# Patient Record
Sex: Female | Born: 1965 | Race: White | Hispanic: No | Smoking: Current every day smoker
Health system: Southern US, Community
[De-identification: ages and names within clinical notes are randomized; demographics above are authoritative.]

## PROBLEM LIST (undated history)

## (undated) DIAGNOSIS — E7800 Pure hypercholesterolemia, unspecified: Secondary | ICD-10-CM

## (undated) DIAGNOSIS — M1990 Unspecified osteoarthritis, unspecified site: Secondary | ICD-10-CM

## (undated) DIAGNOSIS — T7840XA Allergy, unspecified, initial encounter: Secondary | ICD-10-CM

## (undated) DIAGNOSIS — Z87898 Personal history of other specified conditions: Secondary | ICD-10-CM

## (undated) DIAGNOSIS — B009 Herpesviral infection, unspecified: Secondary | ICD-10-CM

## (undated) HISTORY — DX: Herpesviral infection, unspecified: B00.9

## (undated) HISTORY — DX: Pure hypercholesterolemia, unspecified: E78.00

## (undated) HISTORY — DX: Allergy, unspecified, initial encounter: T78.40XA

## (undated) HISTORY — PX: TYMPANOPLASTY: SHX33

## (undated) HISTORY — DX: Personal history of other specified conditions: Z87.898

## (undated) HISTORY — PX: NOVASURE ABLATION: SHX5394

## (undated) HISTORY — PX: CERVICAL BIOPSY  W/ LOOP ELECTRODE EXCISION: SUR135

## (undated) HISTORY — PX: JOINT REPLACEMENT: SHX530

---

## 2005-11-07 ENCOUNTER — Ambulatory Visit: Payer: Self-pay | Admitting: Obstetrics and Gynecology

## 2006-11-11 ENCOUNTER — Ambulatory Visit: Payer: Self-pay | Admitting: Obstetrics and Gynecology

## 2007-02-26 ENCOUNTER — Ambulatory Visit: Payer: Self-pay | Admitting: Obstetrics and Gynecology

## 2007-03-13 ENCOUNTER — Ambulatory Visit: Payer: Self-pay | Admitting: Obstetrics and Gynecology

## 2007-09-03 HISTORY — PX: ANTERIOR CRUCIATE LIGAMENT REPAIR: SHX115

## 2007-12-15 ENCOUNTER — Ambulatory Visit: Payer: Self-pay | Admitting: Internal Medicine

## 2007-12-21 ENCOUNTER — Ambulatory Visit: Payer: Self-pay | Admitting: Obstetrics and Gynecology

## 2008-09-02 ENCOUNTER — Emergency Department: Payer: Self-pay | Admitting: Emergency Medicine

## 2008-09-07 ENCOUNTER — Ambulatory Visit: Payer: Self-pay | Admitting: Internal Medicine

## 2008-11-03 ENCOUNTER — Encounter: Payer: Self-pay | Admitting: Orthopedic Surgery

## 2008-12-01 ENCOUNTER — Encounter: Payer: Self-pay | Admitting: Orthopedic Surgery

## 2009-01-26 ENCOUNTER — Ambulatory Visit: Payer: Self-pay | Admitting: Obstetrics and Gynecology

## 2010-02-27 ENCOUNTER — Ambulatory Visit: Payer: Self-pay | Admitting: Obstetrics and Gynecology

## 2010-11-28 ENCOUNTER — Encounter: Payer: Self-pay | Admitting: Orthopedic Surgery

## 2010-12-02 ENCOUNTER — Encounter: Payer: Self-pay | Admitting: Orthopedic Surgery

## 2011-06-13 ENCOUNTER — Ambulatory Visit: Payer: Self-pay | Admitting: Obstetrics and Gynecology

## 2012-09-01 ENCOUNTER — Ambulatory Visit: Payer: Self-pay | Admitting: Obstetrics and Gynecology

## 2013-07-12 ENCOUNTER — Encounter: Payer: Self-pay | Admitting: Internal Medicine

## 2013-07-12 ENCOUNTER — Encounter (INDEPENDENT_AMBULATORY_CARE_PROVIDER_SITE_OTHER): Payer: Self-pay

## 2013-07-12 ENCOUNTER — Ambulatory Visit (INDEPENDENT_AMBULATORY_CARE_PROVIDER_SITE_OTHER): Payer: BC Managed Care – PPO | Admitting: Internal Medicine

## 2013-07-12 VITALS — BP 120/80 | HR 70 | Temp 98.2°F | Ht 67.0 in | Wt 191.0 lb

## 2013-07-12 DIAGNOSIS — Z8742 Personal history of other diseases of the female genital tract: Secondary | ICD-10-CM

## 2013-07-12 DIAGNOSIS — B009 Herpesviral infection, unspecified: Secondary | ICD-10-CM

## 2013-07-12 DIAGNOSIS — F4311 Post-traumatic stress disorder, acute: Secondary | ICD-10-CM

## 2013-07-12 DIAGNOSIS — F4310 Post-traumatic stress disorder, unspecified: Secondary | ICD-10-CM

## 2013-07-12 DIAGNOSIS — Z87898 Personal history of other specified conditions: Secondary | ICD-10-CM

## 2013-07-12 DIAGNOSIS — M25569 Pain in unspecified knee: Secondary | ICD-10-CM

## 2013-07-12 DIAGNOSIS — E7800 Pure hypercholesterolemia, unspecified: Secondary | ICD-10-CM

## 2013-07-12 NOTE — Progress Notes (Signed)
Pre-visit discussion using our clinic review tool. No additional management support is needed unless otherwise documented below in the visit note.  

## 2013-07-15 ENCOUNTER — Encounter: Payer: Self-pay | Admitting: Internal Medicine

## 2013-07-15 DIAGNOSIS — F439 Reaction to severe stress, unspecified: Secondary | ICD-10-CM | POA: Insufficient documentation

## 2013-07-15 DIAGNOSIS — B009 Herpesviral infection, unspecified: Secondary | ICD-10-CM | POA: Insufficient documentation

## 2013-07-15 DIAGNOSIS — E7800 Pure hypercholesterolemia, unspecified: Secondary | ICD-10-CM | POA: Insufficient documentation

## 2013-07-15 DIAGNOSIS — R87619 Unspecified abnormal cytological findings in specimens from cervix uteri: Secondary | ICD-10-CM | POA: Insufficient documentation

## 2013-07-15 DIAGNOSIS — M25569 Pain in unspecified knee: Secondary | ICD-10-CM | POA: Insufficient documentation

## 2013-07-15 NOTE — Assessment & Plan Note (Signed)
On Valtrex 

## 2013-07-15 NOTE — Assessment & Plan Note (Signed)
Doing well.  On citalopram.

## 2013-07-15 NOTE — Assessment & Plan Note (Signed)
On simvastatin.  Low cholesterol diet.  Follow lipid profile and liver panel.   

## 2013-07-15 NOTE — Assessment & Plan Note (Signed)
S/p previous LEEP.  Recent paps negative.  Has been followed by Dr Feliberto Gottron.

## 2013-07-15 NOTE — Assessment & Plan Note (Signed)
S/p previous knee surgery.  Doing well.

## 2013-07-15 NOTE — Progress Notes (Signed)
  Subjective:    Patient ID: Maria Munoz, female    DOB: 1965/12/31, 47 y.o.   MRN: [id number]  HPI 47 year old female with past history of hypercholesterolemia and abnormal pap smear s/p LEEP.  She comes in today to establish care.  She has been seeing Dr Feliberto Gottron.  She had a knee surgery (left knee).  Doing well.  Wears a kne brace.  Still exercising.  She also had Novasure ablation.  No bleeding since.  Had last pap smear 09/2012.  Sees Dr Feliberto Gottron.  Latest pap smears normal.  Stays active.  No cardiac symptoms with increased activity or exertion.  Breathing stable.  No nausea or vomiting.  Bowels stable.  On citalopram.  Doing well on this.  Had some counseling previously when she went through her divorce.      Past Medical History  Diagnosis Date  . Herpes     H/O  . Hypercholesterolemia   . History of abnormal Pap smear     Outpatient Encounter Prescriptions as of 07/12/2013  Medication Sig  . citalopram (CELEXA) 20 MG tablet Take 20 mg by mouth daily.  . meloxicam (MOBIC) 15 MG tablet Take 15 mg by mouth daily.  . simvastatin (ZOCOR) 20 MG tablet Take 20 mg by mouth daily.  . valACYclovir (VALTREX) 500 MG tablet Take 500 mg by mouth daily.  . [DISCONTINUED] escitalopram (LEXAPRO) 10 MG tablet Take 10 mg by mouth daily.  . [DISCONTINUED] meloxicam (MOBIC) 7.5 MG tablet Take 7.5 mg by mouth daily.    Review of Systems Patient denies any headache, lightheadedness or dizziness.  No sinus or allergy symptoms. iNo chest pain, tightness or palpatations.  No increased shortness of breath, cough or congestion.  No nausea or vomiting.  No acid reflux.  No abdominal pain or cramping.  No bowel change, such as diarrhea, constipation, BRBPR or melana.  No urine change.  No vaginal problems.  No bleeding since her novasure ablation.  Overall feels she is doing well.         Objective:   Physical Exam Filed Vitals:   07/12/13 1028  BP: 120/80  Pulse: 70  Temp: 98.2 F (31.21 C)   46  year old female in no acute distress.   HEENT:  Nares- clear.  Oropharynx - without lesions. NECK:  Supple.  Nontender.  No audible bruit.  HEART:  Appears to be regular. LUNGS:  No crackles or wheezing audible.  Respirations even and unlabored.  RADIAL PULSE:  Equal bilaterally.    BREASTS:  Performed by Dr Feliberto Gottron.   ABDOMEN:  Soft, nontender.  Bowel sounds present and normal.  No audible abdominal bruit.  GU:  Performed by Dr Feliberto Gottron.   EXTREMITIES:  No increased edema present.  DP pulses palpable and equal bilaterally.      Assessment & Plan:  HEALTH MAINTENANCE.  Has seen Dr Feliberto Gottron.  Had her breast, pelvic and pap smear in 09/2012.  Obtain records.  Mammogram 04/2013.    I spent 40 minutes with the patient and more than 50% of the time was spent in consultation regarding the above.

## 2013-07-16 ENCOUNTER — Other Ambulatory Visit: Payer: Self-pay | Admitting: *Deleted

## 2013-07-16 MED ORDER — VALACYCLOVIR HCL 500 MG PO TABS: 500.0000 mg | ORAL_TABLET | Freq: Every day | ORAL | Status: DC

## 2013-07-16 MED ORDER — CITALOPRAM HYDROBROMIDE 20 MG PO TABS: 20.0000 mg | ORAL_TABLET | Freq: Every day | ORAL | Status: DC

## 2013-07-16 MED ORDER — MELOXICAM 15 MG PO TABS: 15.0000 mg | ORAL_TABLET | Freq: Every day | ORAL | Status: DC

## 2013-07-23 ENCOUNTER — Other Ambulatory Visit (INDEPENDENT_AMBULATORY_CARE_PROVIDER_SITE_OTHER): Payer: BC Managed Care – PPO

## 2013-07-23 DIAGNOSIS — E7800 Pure hypercholesterolemia, unspecified: Secondary | ICD-10-CM

## 2013-07-23 LAB — LIPID PANEL
Cholesterol: 205 mg/dL — ABNORMAL HIGH (ref 0–200)
HDL: 83.7 mg/dL (ref >39.00)
Total CHOL/HDL Ratio: 2
Triglycerides: 80 mg/dL (ref 0.0–149.0)
VLDL: 16 mg/dL (ref 0.0–40.0)

## 2013-07-23 LAB — TSH: TSH: 1.25 u[IU]/mL (ref 0.35–5.50)

## 2013-07-23 LAB — CBC WITH DIFFERENTIAL/PLATELET
Basophils Absolute: 0.1 10*3/uL (ref 0.0–0.1)
Basophils Relative: 1.1 % (ref 0.0–3.0)
Eosinophils Absolute: 0.3 10*3/uL (ref 0.0–0.7)
Eosinophils Relative: 3.5 % (ref 0.0–5.0)
HCT: 39.3 % (ref 36.0–46.0)
Hemoglobin: 13.4 g/dL (ref 12.0–15.0)
Lymphs Abs: 2.3 10*3/uL (ref 0.7–4.0)
MCHC: 34.1 g/dL (ref 30.0–36.0)
MCV: 92.4 fl (ref 78.0–100.0)
Monocytes Absolute: 0.5 10*3/uL (ref 0.1–1.0)
Neutro Abs: 4.6 10*3/uL (ref 1.4–7.7)
Neutrophils Relative %: 59.2 % (ref 43.0–77.0)
RBC: 4.25 Mil/uL (ref 3.87–5.11)

## 2013-07-23 LAB — COMPREHENSIVE METABOLIC PANEL
ALT: 23 U/L (ref 0–35)
AST: 20 U/L (ref 0–37)
Albumin: 3.8 g/dL (ref 3.5–5.2)
Alkaline Phosphatase: 65 U/L (ref 39–117)
BUN: 13 mg/dL (ref 6–23)
Creatinine, Ser: 0.6 mg/dL (ref 0.4–1.2)
Glucose, Bld: 79 mg/dL (ref 70–99)
Potassium: 4.1 mEq/L (ref 3.5–5.1)
Total Bilirubin: 0.6 mg/dL (ref 0.3–1.2)

## 2013-07-26 ENCOUNTER — Encounter: Payer: Self-pay | Admitting: *Deleted

## 2013-10-19 ENCOUNTER — Encounter: Payer: BC Managed Care – PPO | Admitting: Internal Medicine

## 2013-12-15 ENCOUNTER — Ambulatory Visit (INDEPENDENT_AMBULATORY_CARE_PROVIDER_SITE_OTHER)
Admission: RE | Admit: 2013-12-15 | Discharge: 2013-12-15 | Disposition: A | Discharge status: Home / Self Care | Payer: BC Managed Care – PPO | Source: Ambulatory Visit | Attending: Internal Medicine | Admitting: Internal Medicine

## 2013-12-15 ENCOUNTER — Encounter: Payer: Self-pay | Admitting: Emergency Medicine

## 2013-12-15 ENCOUNTER — Ambulatory Visit (INDEPENDENT_AMBULATORY_CARE_PROVIDER_SITE_OTHER): Payer: BC Managed Care – PPO | Admitting: Internal Medicine

## 2013-12-15 ENCOUNTER — Encounter: Payer: Self-pay | Admitting: Internal Medicine

## 2013-12-15 ENCOUNTER — Other Ambulatory Visit (HOSPITAL_COMMUNITY)
Admission: RE | Admit: 2013-12-15 | Discharge: 2013-12-15 | Disposition: A | Discharge status: Home / Self Care | Payer: BC Managed Care – PPO | Source: Ambulatory Visit | Attending: Internal Medicine | Admitting: Internal Medicine

## 2013-12-15 VITALS — BP 114/78 | HR 72 | Temp 97.9°F | Ht 65.5 in | Wt 198.2 lb

## 2013-12-15 DIAGNOSIS — Z01419 Encounter for gynecological examination (general) (routine) without abnormal findings: Secondary | ICD-10-CM | POA: Insufficient documentation

## 2013-12-15 DIAGNOSIS — M25561 Pain in right knee: Secondary | ICD-10-CM

## 2013-12-15 DIAGNOSIS — M25569 Pain in unspecified knee: Secondary | ICD-10-CM

## 2013-12-15 DIAGNOSIS — Z733 Stress, not elsewhere classified: Secondary | ICD-10-CM

## 2013-12-15 DIAGNOSIS — R634 Abnormal weight loss: Secondary | ICD-10-CM

## 2013-12-15 DIAGNOSIS — F439 Reaction to severe stress, unspecified: Secondary | ICD-10-CM

## 2013-12-15 DIAGNOSIS — Z1239 Encounter for other screening for malignant neoplasm of breast: Secondary | ICD-10-CM

## 2013-12-15 DIAGNOSIS — M542 Cervicalgia: Secondary | ICD-10-CM

## 2013-12-15 DIAGNOSIS — Z124 Encounter for screening for malignant neoplasm of cervix: Secondary | ICD-10-CM

## 2013-12-15 DIAGNOSIS — Z1151 Encounter for screening for human papillomavirus (HPV): Secondary | ICD-10-CM | POA: Insufficient documentation

## 2013-12-15 DIAGNOSIS — G5600 Carpal tunnel syndrome, unspecified upper limb: Secondary | ICD-10-CM

## 2013-12-15 DIAGNOSIS — E7800 Pure hypercholesterolemia, unspecified: Secondary | ICD-10-CM

## 2013-12-15 DIAGNOSIS — B009 Herpesviral infection, unspecified: Secondary | ICD-10-CM

## 2013-12-15 MED ORDER — CITALOPRAM HYDROBROMIDE 20 MG PO TABS: 20.0000 mg | ORAL_TABLET | Freq: Every day | ORAL | Status: DC

## 2013-12-15 NOTE — Progress Notes (Signed)
Pre visit review using our clinic review tool, if applicable. No additional management support is needed unless otherwise documented below in the visit note. 

## 2013-12-15 NOTE — Progress Notes (Signed)
Subjective:    Patient ID: Maria Munoz, female    DOB: 14-Aug-1966, 48 y.o.   MRN: [id number]  HPI 48 year old female with past history of hypercholesterolemia and abnormal pap smear s/p LEEP.  She comes in today to f/u on these issues as well as for a complete physical exam.   She has been seeing Dr Ouida Sills.  She had a knee surgery (left knee).  Still exercising.  Walks 3x/week (20-59min).  Her right knee is limiting her walking.  States has been told had some deterioration in her knee.  She also had Novasure ablation.  No bleeding since.  Had last pap smear 09/2012.  Sees Dr Ouida Sills.  Latest pap smears normal.   No cardiac symptoms with increased activity or exertion.  Breathing stable.  No nausea or vomiting.  Bowels stable.  On citalopram.  Doing well on this.  Had some counseling previously when she went through her divorce.   She also reports right shoulder and right arm pain.  Increased neck and shoulder pain.  Has large pendulous breasts and this really aggravates her neck and shoulders.  Desires evaluation for breasts reduction.  She also reports some hand numbness.  Notices when she wakes up in the am.  Once she shakes her hands and arms, symptoms resolve.  She is concerned about her weight gain.  Agreeable to be referred to [facility].     Past Medical History  Diagnosis Date  . Herpes     H/O  . Hypercholesterolemia   . History of abnormal Pap smear     Outpatient Encounter Prescriptions as of 12/15/2013  Medication Sig  . citalopram (CELEXA) 20 MG tablet Take 20 mg by mouth every 3 (three) days.  . meloxicam (MOBIC) 15 MG tablet Take 1 tablet (15 mg total) by mouth daily.  . simvastatin (ZOCOR) 20 MG tablet Take 20 mg by mouth daily.  . valACYclovir (VALTREX) 500 MG tablet Take 1 tablet (500 mg total) by mouth daily.  . [DISCONTINUED] citalopram (CELEXA) 20 MG tablet Take 1 tablet (20 mg total) by mouth daily.    Review of Systems Patient denies any headache,  lightheadedness or dizziness.  No sinus or allergy symptoms.  No chest pain, tightness or palpatations.  No increased shortness of breath, cough or congestion.  No nausea or vomiting.  No acid reflux.  No abdominal pain or cramping.  No bowel change, such as diarrhea, constipation, BRBPR or melana.  No urine change.  No vaginal problems.  No bleeding since her novasure ablation.  Right shoulder and arm pain as outlined.  Increased neck and shoulder pain.  See above.  Increased knee pain.          Objective:   Physical Exam  Filed Vitals:   12/15/13 1109  BP: 110/80  Pulse: 72  Temp: 97.9 F (68.56 C)   48 year old female in no acute distress.   HEENT:  Nares- clear.  Oropharynx - without lesions. NECK:  Supple.  Nontender.  No audible bruit.  HEART:  Appears to be regular. LUNGS:  No crackles or wheezing audible.  Respirations even and unlabored.  RADIAL PULSE:  Equal bilaterally.    BREASTS:  No nipple discharge or nipple retraction present.  Could not appreciate any distinct nodules or axillary adenopathy.  ABDOMEN:  Soft, nontender.  Bowel sounds present and normal.  No audible abdominal bruit.  GU:  Normal external genitalia.  Vaginal vault without lesions.  Cervix identified.  Pap performed.  Could not appreciate any adnexal masses or tenderness.   RECTAL:  Heme negative.   EXTREMITIES:  No increased edema present.  DP pulses palpable and equal bilaterally.   MSK:  Positive phalens and negative tinel's.          Assessment & Plan:  HEALTH MAINTENANCE.  Has seen Dr Ouida Sills.  Physical today.  Pelvic and pap today.   Mammogram 04/2013.    I spent 40 minutes with the patient and more than 50% of the time was spent in consultation regarding the above.

## 2013-12-17 ENCOUNTER — Telehealth: Payer: Self-pay | Admitting: Internal Medicine

## 2013-12-17 DIAGNOSIS — M25561 Pain in right knee: Secondary | ICD-10-CM

## 2013-12-17 NOTE — Telephone Encounter (Signed)
Order placed for physical therapy referral.

## 2013-12-19 ENCOUNTER — Encounter: Payer: Self-pay | Admitting: Internal Medicine

## 2013-12-19 ENCOUNTER — Telehealth: Payer: Self-pay | Admitting: Internal Medicine

## 2013-12-19 DIAGNOSIS — M542 Cervicalgia: Secondary | ICD-10-CM | POA: Insufficient documentation

## 2013-12-19 DIAGNOSIS — G5600 Carpal tunnel syndrome, unspecified upper limb: Secondary | ICD-10-CM | POA: Insufficient documentation

## 2013-12-19 DIAGNOSIS — E66811 Obesity, class 1: Secondary | ICD-10-CM | POA: Insufficient documentation

## 2013-12-19 DIAGNOSIS — E669 Obesity, unspecified: Secondary | ICD-10-CM | POA: Insufficient documentation

## 2013-12-19 NOTE — Assessment & Plan Note (Signed)
S/p previous LEEP.  Recent paps negative.  Has been followed by Dr Ouida Sills.  Pelvic and pap today.

## 2013-12-19 NOTE — Assessment & Plan Note (Signed)
Doing well.  On citalopram.  Desires no further intervention at this time.  Follow.   

## 2013-12-19 NOTE — Assessment & Plan Note (Signed)
She is concerned about her weight gain.  Discussed at length with her today.  Discussed [facility] referral for [facility] loss.  She was in agreement.  Follow.

## 2013-12-19 NOTE — Assessment & Plan Note (Signed)
S/p previous knee surgery.  Doing well.  Now with right knee pain.  Limits her walking. Check knee xray. Further w/up pending results.

## 2013-12-19 NOTE — Assessment & Plan Note (Signed)
On simvastatin.  Low cholesterol diet.  Follow lipid profile and liver panel.   

## 2013-12-19 NOTE — Telephone Encounter (Signed)
She needs referral to [facility] for weight loss (diet education).  She is not diabetic.  I could not get the order to go through this way.   Thanks.

## 2013-12-19 NOTE — Assessment & Plan Note (Signed)
Neck and shoulder pain as outlined.  Worsening.  Has large pendulous breasts.  Aggravates her shoulder and neck pain.  Interested in pursuing an evaluation for breast reduction.  Arrange referral.

## 2013-12-19 NOTE — Assessment & Plan Note (Signed)
On Valtrex 

## 2013-12-19 NOTE — Assessment & Plan Note (Signed)
Hand numbness as outlined.  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  Positive phalens on exam.  Right cock up wrist splint.  Follow.  Notify me if persistent problems.  May require nerve conduction studies if persistent problems.

## 2013-12-20 NOTE — Telephone Encounter (Signed)
Faxed to [facility]

## 2013-12-28 ENCOUNTER — Telehealth: Payer: Self-pay | Admitting: Internal Medicine

## 2013-12-28 NOTE — Telephone Encounter (Signed)
Please advise if additional information is needed

## 2013-12-28 NOTE — Telephone Encounter (Signed)
Do we have a form? Does she have a form?  I just need to know exactly what she needs.

## 2013-12-28 NOTE — Telephone Encounter (Signed)
States she needs to turn in info from wellness visit at her employment for insurance savings.  Needs details of her previous office visit to include vitals, etc., on letterhead.  Call pt with questions and when ready for pick up.

## 2013-12-28 NOTE — Telephone Encounter (Signed)
lmtcb

## 2013-12-29 ENCOUNTER — Ambulatory Visit: Payer: Self-pay | Admitting: Internal Medicine

## 2013-12-29 ENCOUNTER — Other Ambulatory Visit (INDEPENDENT_AMBULATORY_CARE_PROVIDER_SITE_OTHER): Payer: BC Managed Care – PPO

## 2013-12-29 DIAGNOSIS — E7800 Pure hypercholesterolemia, unspecified: Secondary | ICD-10-CM

## 2013-12-29 LAB — COMPREHENSIVE METABOLIC PANEL
ALBUMIN: 4.1 g/dL (ref 3.5–5.2)
ALK PHOS: 65 U/L (ref 39–117)
ALT: 24 U/L (ref 0–35)
AST: 23 U/L (ref 0–37)
BILIRUBIN TOTAL: 0.6 mg/dL (ref 0.3–1.2)
BUN: 13 mg/dL (ref 6–23)
CO2: 30 mEq/L (ref 19–32)
Calcium: 9.3 mg/dL (ref 8.4–10.5)
Chloride: 104 mEq/L (ref 96–112)
Creatinine, Ser: 0.6 mg/dL (ref 0.4–1.2)
GFR: 119.75 mL/min (ref >60.00)
GLUCOSE: 93 mg/dL (ref 70–99)
POTASSIUM: 4 meq/L (ref 3.5–5.1)
SODIUM: 139 meq/L (ref 135–145)
Total Protein: 6.6 g/dL (ref 6.0–8.3)

## 2013-12-29 LAB — LIPID PANEL
Cholesterol: 213 mg/dL — ABNORMAL HIGH (ref 0–200)
HDL: 76.6 mg/dL (ref >39.00)
LDL Cholesterol: 109 mg/dL — ABNORMAL HIGH (ref 0–99)
Total CHOL/HDL Ratio: 3
Triglycerides: 137 mg/dL (ref 0.0–149.0)
VLDL: 27.4 mg/dL (ref 0.0–40.0)

## 2013-12-29 LAB — HM MAMMOGRAPHY: HM Mammogram: NEGATIVE

## 2013-12-30 ENCOUNTER — Encounter: Payer: Self-pay | Admitting: Internal Medicine

## 2014-01-03 NOTE — Telephone Encounter (Signed)
Unread mychart message mailed to patient 

## 2014-01-11 ENCOUNTER — Encounter: Payer: Self-pay | Admitting: *Deleted

## 2014-01-11 ENCOUNTER — Telehealth: Payer: Self-pay | Admitting: Internal Medicine

## 2014-01-11 NOTE — Telephone Encounter (Signed)
I received a note from [facility] regarding steroid infection.  They are wanting me to sign a prescription for her to get a steroid injection.  Is she ok with proceeding with this injection.  If so, let me know and I will sign the order.  Thanks

## 2014-01-11 NOTE — Telephone Encounter (Signed)
Sent mychart message

## 2014-01-14 ENCOUNTER — Other Ambulatory Visit: Payer: Self-pay | Admitting: *Deleted

## 2014-01-14 MED ORDER — VALACYCLOVIR HCL 500 MG PO TABS: 500.0000 mg | ORAL_TABLET | Freq: Every day | ORAL | Status: DC

## 2014-01-17 NOTE — Telephone Encounter (Signed)
I contacted pt after receiving unread mychart notification. Pt states that she would like to proceed with injection.

## 2014-01-17 NOTE — Telephone Encounter (Signed)
Called pt & read unread message to her. She was willing to proceed with injection. Sent phone note back to Dr. Nicki Reaper

## 2014-01-18 NOTE — Telephone Encounter (Signed)
Form faxed back to [facility]

## 2014-01-18 NOTE — Telephone Encounter (Signed)
Signed an placed in your box.

## 2014-02-10 ENCOUNTER — Encounter: Payer: Self-pay | Admitting: Internal Medicine

## 2014-02-10 ENCOUNTER — Ambulatory Visit (INDEPENDENT_AMBULATORY_CARE_PROVIDER_SITE_OTHER): Payer: BC Managed Care – PPO | Admitting: Internal Medicine

## 2014-02-10 VITALS — BP 120/80 | HR 77 | Temp 98.4°F | Ht 65.5 in | Wt 197.5 lb

## 2014-02-10 DIAGNOSIS — M25569 Pain in unspecified knee: Secondary | ICD-10-CM

## 2014-02-10 DIAGNOSIS — Z733 Stress, not elsewhere classified: Secondary | ICD-10-CM

## 2014-02-10 DIAGNOSIS — F439 Reaction to severe stress, unspecified: Secondary | ICD-10-CM

## 2014-02-10 DIAGNOSIS — E7800 Pure hypercholesterolemia, unspecified: Secondary | ICD-10-CM

## 2014-02-10 DIAGNOSIS — E669 Obesity, unspecified: Secondary | ICD-10-CM

## 2014-02-10 DIAGNOSIS — M542 Cervicalgia: Secondary | ICD-10-CM

## 2014-02-10 NOTE — Progress Notes (Signed)
Pre visit review using our clinic review tool, if applicable. No additional management support is needed unless otherwise documented below in the visit note. 

## 2014-02-15 ENCOUNTER — Encounter: Payer: Self-pay | Admitting: Internal Medicine

## 2014-02-15 ENCOUNTER — Other Ambulatory Visit: Payer: Self-pay | Admitting: *Deleted

## 2014-02-15 MED ORDER — VALACYCLOVIR HCL 500 MG PO TABS: 500.0000 mg | ORAL_TABLET | Freq: Every day | ORAL | Status: DC

## 2014-02-15 NOTE — Assessment & Plan Note (Signed)
S/p previous LEEP.  Recent paps negative.  Was followed by Dr Ouida Sills.  Pelvic and pap 12/15/13.  PAP negative with negative HPV.  Follow.

## 2014-02-15 NOTE — Assessment & Plan Note (Signed)
S/p previous knee surgery.  Doing well.  Now with right knee pain.  Limits her walking.  Had knee xray and was referred to physical therapy.  May be some better, but still with increased discomfort.  Will refer to ortho (Dr Wynelle Link for further evaluation and treatment).

## 2014-02-15 NOTE — Assessment & Plan Note (Signed)
Doing well.  On citalopram.  Desires no further intervention at this time.  Follow.

## 2014-02-15 NOTE — Assessment & Plan Note (Signed)
On simvastatin.  Low cholesterol diet.  Follow lipid profile and liver panel.

## 2014-02-15 NOTE — Assessment & Plan Note (Signed)
Neck and shoulder pain as outlined.  Worsening.  Has large pendulous breasts.  Aggravates her shoulder and neck pain.  Affects her sleeping.   Interested in pursuing an evaluation for breast reduction.  Arrange referral.

## 2014-02-15 NOTE — Progress Notes (Signed)
Subjective:    Patient ID: Maria Munoz, female    DOB: Jan 10, 1966, 48 y.o.   MRN: [id number]  HPI 48 year old female with past history of hypercholesterolemia and abnormal pap smear s/p LEEP.  She comes in today for a scheduled follow up.  She has been seeing Dr Ouida Sills.  She had a knee surgery (left knee).  Still exercising.  Walks 3x/week (20-48min).  Her right knee is limiting her walking.  States has been told had some deterioration in her knee.  She has been to physical therapy.  States they "shifted her knee".  Did feel better.  Still with increased discomfort..  Request to see ortho.  She also had Novasure ablation.  No bleeding since.  No cardiac symptoms with increased activity or exertion.  Breathing stable.  No nausea or vomiting.  Bowels stable.  On citalopram.  Doing well on this.  Had some counseling previously when she went through her divorce.   She also reports right shoulder and right arm pain.  Increased neck and shoulder pain.  Has large pendulous breasts and this really aggravates her neck and shoulders.  Affects her sleeping.  Desires evaluation for breasts reduction.  We discussed this again today.  Will refer to Dr Bluford Main.     Past Medical History  Diagnosis Date  . Herpes     H/O  . Hypercholesterolemia   . History of abnormal Pap smear     Outpatient Encounter Prescriptions as of 02/10/2014  Medication Sig  . citalopram (CELEXA) 20 MG tablet Take 1 tablet (20 mg total) by mouth daily.  . meloxicam (MOBIC) 15 MG tablet Take 1 tablet (15 mg total) by mouth daily.  . simvastatin (ZOCOR) 20 MG tablet Take 20 mg by mouth daily.  . [DISCONTINUED] valACYclovir (VALTREX) 500 MG tablet Take 1 tablet (500 mg total) by mouth daily.    Review of Systems Patient denies any headache, lightheadedness or dizziness.  No sinus or allergy symptoms.  No chest pain, tightness or palpatations.  No increased shortness of breath, cough or congestion.  No nausea or vomiting.  No acid  reflux.  No abdominal pain or cramping.  No bowel change, such as diarrhea, constipation, BRBPR or melana.  No urine change.  No vaginal problems.  No bleeding since her novasure ablation.  Shoulder and arm pain as outlined.  Increased neck and shoulder pain.  See above.  Refer for evaluation of possible breast reduction.  Affecting her sleep.  Increased pain and discomfort.  Persistent knee issues as outlined despite mobic and physical therapy.           Objective:   Physical Exam  Filed Vitals:   02/10/14 1646  BP: 120/80  Pulse: 77  Temp: 98.4 F (36.9 C)   Blood pressure recheck:  32/45  48 year old female in no acute distress.   HEENT:  Nares- clear.  Oropharynx - without lesions. NECK:  Supple.  Nontender.  No audible bruit.  HEART:  Appears to be regular. LUNGS:  No crackles or wheezing audible.  Respirations even and unlabored.  RADIAL PULSE:  Equal bilaterally.   ABDOMEN:  Soft, nontender.  Bowel sounds present and normal.  No audible abdominal bruit.   EXTREMITIES:  No increased edema present.  DP pulses palpable and equal bilaterally.      Assessment & Plan:  HEALTH MAINTENANCE.  Physical here 12/15/13.  PAP 12/15/13 negative with negative HPV.   Mammogram 12/29/13 - Birads I.  I spent 25 minutes with the patient and more than 50% of the time was spent in consultation regarding the above, specifically discussing surgery referral and ortho referral as well as update on her other medical issues.

## 2014-02-15 NOTE — Assessment & Plan Note (Signed)
Diet and exercise.  Hopefully if we can get her knee doing better, she will be able to exercise more.  Activity is limited by her knee pain.

## 2014-02-23 ENCOUNTER — Other Ambulatory Visit: Payer: Self-pay | Admitting: *Deleted

## 2014-02-23 MED ORDER — MELOXICAM 15 MG PO TABS: 15.0000 mg | ORAL_TABLET | Freq: Every day | ORAL | Status: DC

## 2014-02-23 NOTE — Telephone Encounter (Signed)
[facility] sent fax over requesting meloxicam 15 mg #30 with 5 refills take one by mouth daily. Refilled by dr Milinda Pointer.

## 2014-05-05 ENCOUNTER — Encounter: Payer: Self-pay | Admitting: Internal Medicine

## 2014-05-11 ENCOUNTER — Encounter: Payer: Self-pay | Admitting: Internal Medicine

## 2014-05-11 ENCOUNTER — Other Ambulatory Visit (INDEPENDENT_AMBULATORY_CARE_PROVIDER_SITE_OTHER): Payer: BC Managed Care – PPO

## 2014-05-11 DIAGNOSIS — Z733 Stress, not elsewhere classified: Secondary | ICD-10-CM

## 2014-05-11 DIAGNOSIS — E7800 Pure hypercholesterolemia, unspecified: Secondary | ICD-10-CM

## 2014-05-11 DIAGNOSIS — F439 Reaction to severe stress, unspecified: Secondary | ICD-10-CM

## 2014-05-11 LAB — COMPREHENSIVE METABOLIC PANEL
ALK PHOS: 50 U/L (ref 39–117)
ALT: 30 U/L (ref 0–35)
AST: 21 U/L (ref 0–37)
Albumin: 3.9 g/dL (ref 3.5–5.2)
BILIRUBIN TOTAL: 0.5 mg/dL (ref 0.2–1.2)
BUN: 10 mg/dL (ref 6–23)
CO2: 29 mEq/L (ref 19–32)
CREATININE: 0.5 mg/dL (ref 0.4–1.2)
Calcium: 9.3 mg/dL (ref 8.4–10.5)
Chloride: 103 mEq/L (ref 96–112)
GFR: 127.27 mL/min (ref >60.00)
Glucose, Bld: 85 mg/dL (ref 70–99)
Potassium: 3.8 mEq/L (ref 3.5–5.1)
SODIUM: 140 meq/L (ref 135–145)
TOTAL PROTEIN: 6.9 g/dL (ref 6.0–8.3)

## 2014-05-11 LAB — LIPID PANEL
CHOL/HDL RATIO: 2
Cholesterol: 174 mg/dL (ref 0–200)
HDL: 70.6 mg/dL (ref >39.00)
LDL CALC: 85 mg/dL (ref 0–99)
NONHDL: 103.4
Triglycerides: 92 mg/dL (ref 0.0–149.0)
VLDL: 18.4 mg/dL (ref 0.0–40.0)

## 2014-05-13 ENCOUNTER — Encounter: Payer: Self-pay | Admitting: Internal Medicine

## 2014-05-13 ENCOUNTER — Ambulatory Visit (INDEPENDENT_AMBULATORY_CARE_PROVIDER_SITE_OTHER): Payer: BC Managed Care – PPO | Admitting: Internal Medicine

## 2014-05-13 VITALS — BP 120/78 | HR 87 | Temp 98.3°F | Ht 65.5 in | Wt 175.2 lb

## 2014-05-13 DIAGNOSIS — F439 Reaction to severe stress, unspecified: Secondary | ICD-10-CM

## 2014-05-13 DIAGNOSIS — Z23 Encounter for immunization: Secondary | ICD-10-CM

## 2014-05-13 DIAGNOSIS — Z733 Stress, not elsewhere classified: Secondary | ICD-10-CM

## 2014-05-13 DIAGNOSIS — M542 Cervicalgia: Secondary | ICD-10-CM

## 2014-05-13 DIAGNOSIS — E7800 Pure hypercholesterolemia, unspecified: Secondary | ICD-10-CM

## 2014-05-13 DIAGNOSIS — M25569 Pain in unspecified knee: Secondary | ICD-10-CM

## 2014-05-13 DIAGNOSIS — E669 Obesity, unspecified: Secondary | ICD-10-CM

## 2014-05-13 NOTE — Progress Notes (Signed)
Pre visit review using our clinic review tool, if applicable. No additional management support is needed unless otherwise documented below in the visit note. 

## 2014-05-13 NOTE — Telephone Encounter (Signed)
Unread mychart message mailed to patient 

## 2014-05-15 ENCOUNTER — Encounter: Payer: Self-pay | Admitting: Internal Medicine

## 2014-05-15 NOTE — Assessment & Plan Note (Signed)
Neck and shoulder pain as outlined.  Worsening.  Has large pendulous breasts.  Aggravates her shoulder and neck pain.  Affects her sleeping.   Pursuing an evaluation for breast reduction.  I do feel her breast size aggravating the pain.  Seeing surgery.

## 2014-05-15 NOTE — Assessment & Plan Note (Signed)
S/p previous knee surgery.  Doing well.  Now with right knee pain.  Limits her walking.  Had knee xray and was referred to physical therapy.  May be some better, but still with increased discomfort.  Was referred to ortho (Dr Wynelle Link for further evaluation and treatment).  Planning for MRI tomorrow.

## 2014-05-15 NOTE — Assessment & Plan Note (Signed)
On simvastatin.  Low cholesterol diet.  Follow lipid profile and liver panel.

## 2014-05-15 NOTE — Assessment & Plan Note (Signed)
Doing well.  On citalopram.  Desires no further intervention at this time.  Follow.

## 2014-05-15 NOTE — Assessment & Plan Note (Signed)
She has lost weight.  BMI now 28.  Continue diet and exercise.  Follow.

## 2014-05-15 NOTE — Progress Notes (Signed)
  Subjective:    Patient ID: Maria Munoz, female    DOB: [birth year], 48 y.o.   MRN: [id number]  HPI 48 year old female with past history of hypercholesterolemia and abnormal pap smear s/p LEEP.  She comes in today for a scheduled follow up.  She had a knee surgery (left knee).  Still exercising.  Walks.  Her right knee is limiting her walking.  States has been told had some deterioration in her knee.  She has been to physical therapy.  States they "shifted her knee".  Did feel better.  Still with increased discomfort..  She also had Novasure ablation.  No bleeding since.  No cardiac symptoms with increased activity or exertion.  Breathing stable.  No nausea or vomiting.  Bowels stable.  On citalopram.  Doing well on this.  Feels better on the medication.  She is participating in [place] [facility].  Has adjusted her diet - low carb/low sugar diet.  Has lost weight.   Even with the weight loss, she still reports right shoulder and right arm pain.  Increased neck and shoulder pain.  Has large pendulous breasts and this really aggravates her neck and shoulders.  Affects her sleeping.  The weight loss did not improve the pain.      Past Medical History  Diagnosis Date  . Herpes     H/O  . Hypercholesterolemia   . History of abnormal Pap smear     Outpatient Encounter Prescriptions as of 05/13/2014  Medication Sig  . citalopram (CELEXA) 20 MG tablet Take 1 tablet (20 mg total) by mouth daily.  . meloxicam (MOBIC) 15 MG tablet Take 1 tablet (15 mg total) by mouth daily.  . simvastatin (ZOCOR) 20 MG tablet Take 20 mg by mouth daily.  . valACYclovir (VALTREX) 500 MG tablet Take 1 tablet (500 mg total) by mouth daily.    Review of Systems Patient denies any headache, lightheadedness or dizziness.  No sinus or allergy symptoms.  No chest pain, tightness or palpatations.  No increased shortness of breath, cough or congestion.  No nausea or vomiting.  No acid reflux.  No abdominal pain or cramping.  No  bowel change, such as diarrhea, constipation, BRBPR or melana.  No urine change.  No vaginal problems.  No bleeding since her novasure ablation.  Shoulder and arm pain as outlined.  Increased neck and shoulder pain.  See above.  Was referred for evaluation of possible breast reduction.  Affecting her sleep.  Increased pain and discomfort felt to be related (and aggravated by) her large pendulous breasts.           Objective:   Physical Exam  Filed Vitals:   05/13/14 1358  BP: 120/78  Pulse: 87  Temp: 98.3 F (35.81 C)   48 year old female in no acute distress.   HEENT:  Nares- clear.  Oropharynx - without lesions. NECK:  Supple.  Nontender.  No audible bruit.  HEART:  Appears to be regular. LUNGS:  No crackles or wheezing audible.  Respirations even and unlabored.  RADIAL PULSE:  Equal bilaterally.   ABDOMEN:  Soft, nontender.  Bowel sounds present and normal.  No audible abdominal bruit.   EXTREMITIES:  No increased edema present.  DP pulses palpable and equal bilaterally.      Assessment & Plan:  HEALTH MAINTENANCE.  Physical here 12/15/13.  PAP 12/15/13 negative with negative HPV.   Mammogram 12/29/13 - Birads I.

## 2014-05-20 ENCOUNTER — Other Ambulatory Visit: Payer: Self-pay | Admitting: *Deleted

## 2014-05-20 MED ORDER — CITALOPRAM HYDROBROMIDE 20 MG PO TABS: 20.0000 mg | ORAL_TABLET | Freq: Every day | ORAL | Status: DC

## 2014-06-13 ENCOUNTER — Other Ambulatory Visit: Payer: Self-pay | Admitting: *Deleted

## 2014-06-13 MED ORDER — VALACYCLOVIR HCL 500 MG PO TABS: 500.0000 mg | ORAL_TABLET | Freq: Every day | ORAL | Status: DC

## 2014-10-06 ENCOUNTER — Telehealth: Payer: Self-pay | Admitting: *Deleted

## 2014-10-06 MED ORDER — VALACYCLOVIR HCL 500 MG PO TABS: 500.0000 mg | ORAL_TABLET | Freq: Every day | ORAL | Status: DC

## 2014-10-06 NOTE — Telephone Encounter (Signed)
Fax from pharmacy requesting Valacyclovir HCL 500 mg.  Pts last OV 9.11.15, last refill 12.12.15.  Please advise refill.

## 2014-10-06 NOTE — Telephone Encounter (Signed)
Refill valtrex #30 with 2 refills.

## 2014-10-10 ENCOUNTER — Other Ambulatory Visit: Payer: Self-pay | Admitting: *Deleted

## 2014-10-10 MED ORDER — MELOXICAM 15 MG PO TABS: 15.0000 mg | ORAL_TABLET | Freq: Every day | ORAL | Status: DC

## 2014-10-10 NOTE — Telephone Encounter (Signed)
Refill mobic 

## 2014-10-20 ENCOUNTER — Telehealth: Payer: Self-pay | Admitting: *Deleted

## 2014-10-20 MED ORDER — CITALOPRAM HYDROBROMIDE 20 MG PO TABS: 20.0000 mg | ORAL_TABLET | Freq: Every day | ORAL | Status: DC

## 2014-10-20 NOTE — Telephone Encounter (Signed)
I refilled her citalopram #30 with one refill.  She needs a physical scheduled with me - first available.

## 2014-10-20 NOTE — Telephone Encounter (Signed)
Pt scheduled 4.21.16.

## 2014-10-20 NOTE — Telephone Encounter (Signed)
Fax from pharmacy requesting Citalopram HBR 20 mg tab.  Last OV 9.11.15, last refill 9.18.15.  Please advise refill

## 2014-10-31 ENCOUNTER — Ambulatory Visit: Payer: Self-pay | Admitting: Family Medicine

## 2014-11-04 ENCOUNTER — Encounter: Payer: Self-pay | Admitting: Internal Medicine

## 2014-12-22 ENCOUNTER — Ambulatory Visit (INDEPENDENT_AMBULATORY_CARE_PROVIDER_SITE_OTHER): Payer: BC Managed Care – PPO | Admitting: Internal Medicine

## 2014-12-22 ENCOUNTER — Encounter: Payer: Self-pay | Admitting: Internal Medicine

## 2014-12-22 VITALS — BP 100/58 | HR 64 | Temp 98.4°F | Ht 66.0 in | Wt 163.3 lb

## 2014-12-22 DIAGNOSIS — E2839 Other primary ovarian failure: Secondary | ICD-10-CM

## 2014-12-22 DIAGNOSIS — Z0000 Encounter for general adult medical examination without abnormal findings: Secondary | ICD-10-CM

## 2014-12-22 DIAGNOSIS — F439 Reaction to severe stress, unspecified: Secondary | ICD-10-CM

## 2014-12-22 DIAGNOSIS — R87619 Unspecified abnormal cytological findings in specimens from cervix uteri: Secondary | ICD-10-CM

## 2014-12-22 DIAGNOSIS — E7800 Pure hypercholesterolemia, unspecified: Secondary | ICD-10-CM

## 2014-12-22 DIAGNOSIS — Z1239 Encounter for other screening for malignant neoplasm of breast: Secondary | ICD-10-CM

## 2014-12-22 DIAGNOSIS — E669 Obesity, unspecified: Secondary | ICD-10-CM

## 2014-12-22 DIAGNOSIS — M25569 Pain in unspecified knee: Secondary | ICD-10-CM

## 2014-12-22 DIAGNOSIS — M542 Cervicalgia: Secondary | ICD-10-CM

## 2014-12-22 DIAGNOSIS — E66811 Obesity, class 1: Secondary | ICD-10-CM

## 2014-12-22 NOTE — Progress Notes (Signed)
Patient ID: Maria Munoz, female   DOB: [birth year], 49 y.o.   MRN: [id number]   Subjective:    Patient ID: Maria Munoz, female    DOB: April 21, 1966, 49 y.o.   MRN: [id number]  HPI  Patient here for her physical exam.  Seeing [facility].  S/p aspiration and cortisone injection - knee.  Has adjusted her diet.  Lost weight.  Still with increased issues - pain shoulders and back - enlarged breasts.  Trying to stay active.  No cardiac symptoms with increased activity or exertion.  Breathing stable.  Bowels stable.    Past Medical History  Diagnosis Date  . Herpes     H/O  . Hypercholesterolemia   . History of abnormal Pap smear     Review of Systems  Constitutional: Negative for appetite change and unexpected weight change (has adjusted her diet.  lost weight).  HENT: Negative for congestion and sinus pressure.   Eyes: Negative for pain and visual disturbance.  Respiratory: Negative for cough, chest tightness and shortness of breath.   Cardiovascular: Negative for chest pain, palpitations and leg swelling.  Gastrointestinal: Negative for nausea, vomiting, abdominal pain and diarrhea.  Genitourinary: Negative for dysuria and difficulty urinating.  Musculoskeletal:       Shoulder pain and back pain - persistent - enlarged breasts.  Seeing ortho for her knee.    Skin: Negative for color change and rash.  Neurological: Negative for dizziness, light-headedness and headaches.  Hematological: Negative for adenopathy. Does not bruise/bleed easily.  Psychiatric/Behavioral: Negative for dysphoric mood and agitation.       Objective:    Physical Exam  Constitutional: She is oriented to person, place, and time. She appears well-developed and well-nourished.  HENT:  Nose: Nose normal.  Mouth/Throat: Oropharynx is clear and moist.  Eyes: Right eye exhibits no discharge. Left eye exhibits no discharge. No scleral icterus.  Neck: Neck supple. No thyromegaly present.  Cardiovascular: Normal  rate and regular rhythm.   Pulmonary/Chest: Breath sounds normal. No accessory muscle usage. No tachypnea. No respiratory distress. She has no decreased breath sounds. She has no wheezes. She has no rhonchi. Right breast exhibits no inverted nipple, no mass, no nipple discharge and no tenderness (no axillary adenopathy). Left breast exhibits no inverted nipple, no mass, no nipple discharge and no tenderness (no axilarry adenopathy).  Abdominal: Soft. Bowel sounds are normal. There is no tenderness.  Musculoskeletal: She exhibits no edema or tenderness.  Lymphadenopathy:    She has no cervical adenopathy.  Neurological: She is alert and oriented to person, place, and time.  Skin: Skin is warm. No rash noted.  Psychiatric: She has a normal mood and affect. Her behavior is normal.    BP 100/58 mmHg  Pulse 64  Temp(Src) 98.4 F (36.9 C) (Oral)  Ht 5\' 6"  (1.676 m)  Wt 163 lb 4.8 oz (74.072 kg)  BMI 26.37 kg/m2  SpO2 98% Wt Readings from Last 3 Encounters:  12/22/14 163 lb 4.8 oz (74.072 kg)  05/13/14 175 lb 4 oz (79.493 kg)  02/10/14 197 lb 8 oz (89.585 kg)     Lab Results  Component Value Date   WBC 7.7 07/23/2013   HGB 13.4 07/23/2013   HCT 39.3 07/23/2013   PLT 234.0 07/23/2013   GLUCOSE 85 05/11/2014   CHOL 174 05/11/2014   TRIG 92.0 05/11/2014   HDL 70.60 05/11/2014   LDLDIRECT 104.1 07/23/2013   LDLCALC 85 05/11/2014   ALT 30 05/11/2014   AST 21  05/11/2014   NA 140 05/11/2014   K 3.8 05/11/2014   CL 103 05/11/2014   CREATININE 0.5 05/11/2014   BUN 10 05/11/2014   CO2 29 05/11/2014   TSH 1.25 07/23/2013       Assessment & Plan:   Problem List Items Addressed This Visit    None       Einar Pheasant, MD

## 2014-12-22 NOTE — Progress Notes (Signed)
Pre visit review using our clinic review tool, if applicable. No additional management support is needed unless otherwise documented below in the visit note. 

## 2015-01-01 ENCOUNTER — Encounter: Payer: Self-pay | Admitting: Internal Medicine

## 2015-01-01 DIAGNOSIS — Z0000 Encounter for general adult medical examination without abnormal findings: Secondary | ICD-10-CM | POA: Insufficient documentation

## 2015-01-01 NOTE — Assessment & Plan Note (Signed)
Feels she is handling things relatively well.  Follow.   

## 2015-01-01 NOTE — Assessment & Plan Note (Signed)
Seeing ortho.  S/p aspiration and cortisone injection.  Follow.

## 2015-01-01 NOTE — Assessment & Plan Note (Signed)
Low cholesterol diet and exercise.  Follow lipid panel.   

## 2015-01-01 NOTE — Assessment & Plan Note (Signed)
Persistent neck and shoulder pain.  Has lost weight.  Has large pendulous breasts.  Aggravating her pain.  Spoke to Dr Sandi Mariscal.  Check on referral.

## 2015-01-01 NOTE — Assessment & Plan Note (Signed)
Physical 12/22/14.  PAP 12/15/13 - negative with negative HPV.  Mammogram 12/29/13 - Birads I.

## 2015-01-01 NOTE — Assessment & Plan Note (Signed)
PAP 12/15/13 - negative with negative HPV.

## 2015-01-01 NOTE — Assessment & Plan Note (Signed)
Diet and exercise.   

## 2015-01-11 ENCOUNTER — Other Ambulatory Visit: Payer: BC Managed Care – PPO

## 2015-01-18 ENCOUNTER — Telehealth: Payer: Self-pay | Admitting: Internal Medicine

## 2015-01-18 ENCOUNTER — Other Ambulatory Visit (INDEPENDENT_AMBULATORY_CARE_PROVIDER_SITE_OTHER): Payer: BC Managed Care – PPO

## 2015-01-18 ENCOUNTER — Encounter: Payer: Self-pay | Admitting: Internal Medicine

## 2015-01-18 DIAGNOSIS — E7800 Pure hypercholesterolemia, unspecified: Secondary | ICD-10-CM

## 2015-01-18 DIAGNOSIS — D72829 Elevated white blood cell count, unspecified: Secondary | ICD-10-CM

## 2015-01-18 LAB — COMPREHENSIVE METABOLIC PANEL
ALT: 15 U/L (ref 0–35)
AST: 14 U/L (ref 0–37)
Albumin: 4.2 g/dL (ref 3.5–5.2)
Alkaline Phosphatase: 44 U/L (ref 39–117)
BUN: 12 mg/dL (ref 6–23)
CALCIUM: 9.4 mg/dL (ref 8.4–10.5)
CO2: 28 mEq/L (ref 19–32)
Chloride: 101 mEq/L (ref 96–112)
Creatinine, Ser: 0.54 mg/dL (ref 0.40–1.20)
GFR: 126.92 mL/min (ref >60.00)
Glucose, Bld: 92 mg/dL (ref 70–99)
Potassium: 3.7 mEq/L (ref 3.5–5.1)
SODIUM: 135 meq/L (ref 135–145)
Total Bilirubin: 0.6 mg/dL (ref 0.2–1.2)
Total Protein: 6.8 g/dL (ref 6.0–8.3)

## 2015-01-18 LAB — CBC WITH DIFFERENTIAL/PLATELET
Basophils Absolute: 0.1 10*3/uL (ref 0.0–0.1)
Basophils Relative: 0.5 % (ref 0.0–3.0)
EOS PCT: 1.8 % (ref 0.0–5.0)
Eosinophils Absolute: 0.2 10*3/uL (ref 0.0–0.7)
HCT: 40.4 % (ref 36.0–46.0)
Hemoglobin: 13.9 g/dL (ref 12.0–15.0)
Lymphocytes Relative: 17.9 % (ref 12.0–46.0)
Lymphs Abs: 2.4 10*3/uL (ref 0.7–4.0)
MCHC: 34.3 g/dL (ref 30.0–36.0)
MCV: 92.6 fl (ref 78.0–100.0)
MONOS PCT: 3.4 % (ref 3.0–12.0)
Monocytes Absolute: 0.5 10*3/uL (ref 0.1–1.0)
NEUTROS PCT: 76.4 % (ref 43.0–77.0)
Neutro Abs: 10.3 10*3/uL — ABNORMAL HIGH (ref 1.4–7.7)
Platelets: 240 10*3/uL (ref 150.0–400.0)
RBC: 4.37 Mil/uL (ref 3.87–5.11)
RDW: 13.5 % (ref 11.5–15.5)
WBC: 13.4 10*3/uL — AB (ref 4.0–10.5)

## 2015-01-18 LAB — LIPID PANEL
CHOLESTEROL: 199 mg/dL (ref 0–200)
HDL: 102.9 mg/dL (ref >39.00)
LDL CALC: 85 mg/dL (ref 0–99)
NonHDL: 96.1
TRIGLYCERIDES: 56 mg/dL (ref 0.0–149.0)
Total CHOL/HDL Ratio: 2
VLDL: 11.2 mg/dL (ref 0.0–40.0)

## 2015-01-18 LAB — TSH: TSH: 1.27 u[IU]/mL (ref 0.35–4.50)

## 2015-01-18 NOTE — Telephone Encounter (Signed)
Pt was notified of labs via my chart.  Needs a f/u non fasting lab appt in 7-10 days.  Please notify the patient of the appointment date and time.  Thanks.

## 2015-01-19 NOTE — Telephone Encounter (Signed)
Unread mychart message mailed to patitent

## 2015-01-19 NOTE — Telephone Encounter (Signed)
Letter mailed

## 2015-01-20 ENCOUNTER — Other Ambulatory Visit: Payer: Self-pay | Admitting: *Deleted

## 2015-01-20 MED ORDER — CITALOPRAM HYDROBROMIDE 20 MG PO TABS: 20.0000 mg | ORAL_TABLET | Freq: Every day | ORAL | Status: DC

## 2015-02-02 NOTE — Telephone Encounter (Signed)
LMTCB to schedule a non-fasting lab appt.

## 2015-02-02 NOTE — Telephone Encounter (Signed)
Printed to notify pt- never scheduled.

## 2015-02-06 ENCOUNTER — Other Ambulatory Visit: Payer: Self-pay | Admitting: *Deleted

## 2015-02-06 ENCOUNTER — Other Ambulatory Visit: Payer: BC Managed Care – PPO

## 2015-02-06 MED ORDER — VALACYCLOVIR HCL 500 MG PO TABS: 500.0000 mg | ORAL_TABLET | Freq: Every day | ORAL | Status: DC

## 2015-02-10 ENCOUNTER — Other Ambulatory Visit (INDEPENDENT_AMBULATORY_CARE_PROVIDER_SITE_OTHER): Payer: BC Managed Care – PPO

## 2015-02-10 DIAGNOSIS — D72829 Elevated white blood cell count, unspecified: Secondary | ICD-10-CM | POA: Diagnosis not present

## 2015-02-10 LAB — CBC WITH DIFFERENTIAL/PLATELET
BASOS ABS: 0.1 10*3/uL (ref 0.0–0.1)
Basophils Relative: 0.5 % (ref 0.0–3.0)
EOS PCT: 2.6 % (ref 0.0–5.0)
Eosinophils Absolute: 0.3 10*3/uL (ref 0.0–0.7)
HEMATOCRIT: 39.4 % (ref 36.0–46.0)
HEMOGLOBIN: 13.4 g/dL (ref 12.0–15.0)
LYMPHS ABS: 3.6 10*3/uL (ref 0.7–4.0)
Lymphocytes Relative: 34.8 % (ref 12.0–46.0)
MCHC: 34 g/dL (ref 30.0–36.0)
MCV: 93.3 fl (ref 78.0–100.0)
MONOS PCT: 5.1 % (ref 3.0–12.0)
Monocytes Absolute: 0.5 10*3/uL (ref 0.1–1.0)
NEUTROS PCT: 57 % (ref 43.0–77.0)
Neutro Abs: 5.9 10*3/uL (ref 1.4–7.7)
Platelets: 267 10*3/uL (ref 150.0–400.0)
RBC: 4.22 Mil/uL (ref 3.87–5.11)
RDW: 13.3 % (ref 11.5–15.5)
WBC: 10.4 10*3/uL (ref 4.0–10.5)

## 2015-02-11 ENCOUNTER — Encounter: Payer: Self-pay | Admitting: Internal Medicine

## 2015-03-07 ENCOUNTER — Encounter: Payer: Self-pay | Admitting: Internal Medicine

## 2015-05-16 ENCOUNTER — Other Ambulatory Visit: Payer: Self-pay | Admitting: Podiatry

## 2015-05-23 ENCOUNTER — Other Ambulatory Visit: Payer: Self-pay | Admitting: Internal Medicine

## 2015-06-23 ENCOUNTER — Ambulatory Visit (INDEPENDENT_AMBULATORY_CARE_PROVIDER_SITE_OTHER): Payer: BC Managed Care – PPO | Admitting: Internal Medicine

## 2015-06-23 ENCOUNTER — Encounter: Payer: Self-pay | Admitting: Internal Medicine

## 2015-06-23 VITALS — BP 100/60 | HR 65 | Temp 98.0°F | Resp 18 | Ht 66.0 in | Wt 163.0 lb

## 2015-06-23 DIAGNOSIS — M25569 Pain in unspecified knee: Secondary | ICD-10-CM

## 2015-06-23 DIAGNOSIS — R87619 Unspecified abnormal cytological findings in specimens from cervix uteri: Secondary | ICD-10-CM | POA: Diagnosis not present

## 2015-06-23 DIAGNOSIS — E7800 Pure hypercholesterolemia, unspecified: Secondary | ICD-10-CM | POA: Diagnosis not present

## 2015-06-23 DIAGNOSIS — E669 Obesity, unspecified: Secondary | ICD-10-CM

## 2015-06-23 DIAGNOSIS — G5601 Carpal tunnel syndrome, right upper limb: Secondary | ICD-10-CM | POA: Diagnosis not present

## 2015-06-23 DIAGNOSIS — E66811 Obesity, class 1: Secondary | ICD-10-CM

## 2015-06-23 DIAGNOSIS — F439 Reaction to severe stress, unspecified: Secondary | ICD-10-CM

## 2015-06-23 DIAGNOSIS — Z658 Other specified problems related to psychosocial circumstances: Secondary | ICD-10-CM

## 2015-06-23 DIAGNOSIS — Z23 Encounter for immunization: Secondary | ICD-10-CM

## 2015-06-23 MED ORDER — VALACYCLOVIR HCL 500 MG PO TABS: 500.0000 mg | ORAL_TABLET | Freq: Every day | ORAL | Status: DC

## 2015-06-23 MED ORDER — CITALOPRAM HYDROBROMIDE 20 MG PO TABS: ORAL_TABLET | ORAL | Status: DC

## 2015-06-23 NOTE — Patient Instructions (Signed)

## 2015-06-23 NOTE — Progress Notes (Signed)
Pre-visit discussion using our clinic review tool. No additional management support is needed unless otherwise documented below in the visit note.  

## 2015-06-23 NOTE — Progress Notes (Signed)
Patient ID: Maria Munoz, female   DOB: 25-Mar-1966, 49 y.o.   MRN: [id number]   Subjective:    Patient ID: Maria Munoz, female    DOB: 03/30/1966, 49 y.o.   MRN: [id number]  HPI  Patient with past history of increased stress and hypercholesterolemia who comes in today for a scheduled follow up of these issues.  She is having persistent pain in her right knee.  Has had cortisone injection.  No relief.  Wants to discuss with Dr Wynelle Link.  Request referral back to ortho.  She also describes that her hand will fall asleep when she is getting reading in the morning.  When she is fixing her hair or holding her hands above her head, will have a numb/tingling sensation.  Resolves when he moves his hand and arm.  No other numbness or tingling.  No other neurological changes.  Tries to stay active.  No cardiac symptoms with increased activity or exertion.  No sob.  Discussed diet and exercise.  No abdominal pain or cramping.  Bowels stable.     Past Medical History  Diagnosis Date  . Herpes     H/O  . Hypercholesterolemia   . History of abnormal Pap smear    Past Surgical History  Procedure Laterality Date  . Anterior cruciate ligament repair Left 2009  . Novasure ablation    . Cervical biopsy  w/ loop electrode excision     Family History  Problem Relation Age of Onset  . Arthritis Mother   . Hyperlipidemia Mother   . Hypertension Mother   . Cancer Father     prostate  . Hyperlipidemia Father   . Hypertension Father   . Alcohol abuse Maternal Grandfather    Social History   Social History  . Marital Status: Married    Spouse Name: N/A  . Number of Children: N/A  . Years of Education: N/A   Social History Main Topics  . Smoking status: Current Every Day Smoker  . Smokeless tobacco: Never Used     Comment: 2 a day  . Alcohol Use: 0.0 oz/week    0 Standard drinks or equivalent per week  . Drug Use: No  . Sexual Activity: Not Asked   Other Topics Concern  . None   Social  History Narrative    Outpatient Encounter Prescriptions as of 06/23/2015  Medication Sig  . citalopram (CELEXA) 20 MG tablet Take 1 tablet (20 mg total) by mouth daily.  . meloxicam (MOBIC) 15 MG tablet Take 1 tablet (15 mg total) by mouth daily.  . valACYclovir (VALTREX) 500 MG tablet Take 1 tablet (500 mg total) by mouth daily.  . [DISCONTINUED] citalopram (CELEXA) 20 MG tablet Take 1 tablet (20 mg total) by mouth daily.  . [DISCONTINUED] valACYclovir (VALTREX) 500 MG tablet Take 1 tablet (500 mg total) by mouth daily.   No facility-administered encounter medications on file as of 06/23/2015.    Review of Systems  Constitutional: Negative for fever and appetite change.  HENT: Negative for congestion and sinus pressure.   Eyes: Negative for discharge and visual disturbance.  Respiratory: Negative for cough, chest tightness and shortness of breath.   Cardiovascular: Negative for chest pain, palpitations and leg swelling.  Gastrointestinal: Negative for nausea, vomiting, abdominal pain and diarrhea.  Genitourinary: Negative for dysuria and difficulty urinating.  Musculoskeletal: Negative for back pain and joint swelling.       Persistent right knee pain as outlined.    Skin: Negative for  color change and rash.  Neurological: Negative for dizziness, light-headedness and headaches.  Psychiatric/Behavioral: Negative for dysphoric mood and agitation.       Objective:    Physical Exam  Constitutional: She appears well-developed and well-nourished. No distress.  HENT:  Nose: Nose normal.  Mouth/Throat: Oropharynx is clear and moist.  Eyes: Conjunctivae are normal. Right eye exhibits no discharge. Left eye exhibits no discharge.  Neck: Neck supple. No thyromegaly present.  Cardiovascular: Normal rate and regular rhythm.   Pulmonary/Chest: Breath sounds normal. No respiratory distress. She has no wheezes.  Abdominal: Soft. Bowel sounds are normal. There is no tenderness.    Musculoskeletal: She exhibits no edema or tenderness.  Lymphadenopathy:    She has no cervical adenopathy.  Skin: No rash noted. No erythema.  Psychiatric: She has a normal mood and affect. Her behavior is normal.    BP 100/60 mmHg  Pulse 65  Temp(Src) 98 F (36.7 C) (Oral)  Resp 18  Ht 5\' 6"  (1.676 m)  Wt 163 lb (73.936 kg)  BMI 26.32 kg/m2  SpO2 98% Wt Readings from Last 3 Encounters:  06/23/15 163 lb (73.936 kg)  12/22/14 163 lb 4.8 oz (74.072 kg)  05/13/14 175 lb 4 oz (79.493 kg)     Lab Results  Component Value Date   WBC 10.4 02/10/2015   HGB 13.4 02/10/2015   HCT 39.4 02/10/2015   PLT 267.0 02/10/2015   GLUCOSE 92 01/18/2015   CHOL 199 01/18/2015   TRIG 56.0 01/18/2015   HDL 102.90 01/18/2015   LDLDIRECT 104.1 07/23/2013   LDLCALC 85 01/18/2015   ALT 15 01/18/2015   AST 14 01/18/2015   NA 135 01/18/2015   K 3.7 01/18/2015   CL 101 01/18/2015   CREATININE 0.54 01/18/2015   BUN 12 01/18/2015   CO2 28 01/18/2015   TSH 1.27 01/18/2015       Assessment & Plan:   Problem List Items Addressed This Visit    Abnormal Pap smear of cervix    PAP 12/15/13 - negative with negative HPV.        CTS (carpal tunnel syndrome)    Hand numbness as outlined.  Positive phalens.  Wear cock up splint.  Discussed NCS.  Follow.  Keep me posted.        Relevant Medications   citalopram (CELEXA) 20 MG tablet   Hypercholesterolemia    Low cholesterol diet and exercise.  Follow lipid panel.        Knee pain    Has seen ortho.  S/p aspiration and cortisone injection.  Still with pain.  Request f/u with Dr Wynelle Link.  Does not feel meloxicam is working.       Relevant Orders   Ambulatory referral to Orthopedic Surgery   Obesity (BMI 30.0-34.9)    Discussed diet and exercise.        Stress    Increased stress as outlined.  On citalopram.  Follow.         Other Visit Diagnoses    Encounter for immunization    -  Primary        Maria Pheasant, MD

## 2015-06-25 ENCOUNTER — Encounter: Payer: Self-pay | Admitting: Internal Medicine

## 2015-06-25 NOTE — Assessment & Plan Note (Addendum)
Has seen ortho.  S/p aspiration and cortisone injection.  Still with pain.  Request f/u with Dr Wynelle Link.  Does not feel meloxicam is working.

## 2015-06-25 NOTE — Assessment & Plan Note (Signed)
Hand numbness as outlined.  Positive phalens.  Wear cock up splint.  Discussed NCS.  Follow.  Keep me posted.

## 2015-06-25 NOTE — Assessment & Plan Note (Signed)
PAP 12/15/13 - negative with negative HPV.   

## 2015-06-25 NOTE — Assessment & Plan Note (Signed)
Discussed diet and exercise 

## 2015-06-25 NOTE — Assessment & Plan Note (Signed)
Increased stress as outlined.  On citalopram.  Follow.

## 2015-06-25 NOTE — Assessment & Plan Note (Signed)
Low cholesterol diet and exercise.  Follow lipid panel.   

## 2015-07-11 ENCOUNTER — Ambulatory Visit
Admission: RE | Admit: 2015-07-11 | Discharge: 2015-07-11 | Disposition: A | Discharge status: Home / Self Care | Payer: BC Managed Care – PPO | Source: Ambulatory Visit | Attending: Internal Medicine | Admitting: Internal Medicine

## 2015-07-11 DIAGNOSIS — Z13820 Encounter for screening for osteoporosis: Secondary | ICD-10-CM | POA: Diagnosis present

## 2015-07-11 DIAGNOSIS — Z1231 Encounter for screening mammogram for malignant neoplasm of breast: Secondary | ICD-10-CM | POA: Diagnosis not present

## 2015-07-11 DIAGNOSIS — Z1239 Encounter for other screening for malignant neoplasm of breast: Secondary | ICD-10-CM

## 2015-07-11 DIAGNOSIS — E2839 Other primary ovarian failure: Secondary | ICD-10-CM

## 2015-07-12 ENCOUNTER — Encounter: Payer: Self-pay | Admitting: Internal Medicine

## 2015-07-13 NOTE — Telephone Encounter (Signed)
Unread mychart message mailed to patient 

## 2015-09-01 ENCOUNTER — Encounter: Payer: Self-pay | Admitting: Emergency Medicine

## 2015-09-01 ENCOUNTER — Ambulatory Visit
Admission: EM | Admit: 2015-09-01 | Discharge: 2015-09-01 | Disposition: A | Discharge status: Home / Self Care | Payer: BC Managed Care – PPO | Attending: Family Medicine | Admitting: Family Medicine

## 2015-09-01 DIAGNOSIS — J40 Bronchitis, not specified as acute or chronic: Secondary | ICD-10-CM | POA: Diagnosis not present

## 2015-09-01 DIAGNOSIS — J0100 Acute maxillary sinusitis, unspecified: Secondary | ICD-10-CM | POA: Diagnosis not present

## 2015-09-01 MED ORDER — AZITHROMYCIN 250 MG PO TABS: ORAL_TABLET | ORAL | Status: DC

## 2015-09-01 NOTE — ED Notes (Signed)
Cough, congested, sore throat, sneezing for 4 days

## 2015-09-01 NOTE — ED Provider Notes (Signed)
CSN: [id number]     Arrival date & time 09/01/15  1558 History   First MD Initiated Contact with Patient 09/01/15 1647     Chief Complaint  Patient presents with  . URI   (Consider location/radiation/quality/duration/timing/severity/associated sxs/prior Treatment) Patient is a 49 y.o. female presenting with URI. The history is provided by the patient.  URI Presenting symptoms: congestion, cough and sore throat   Severity:  Moderate Onset quality:  Sudden Duration:  7 days Timing:  Constant Progression:  Worsening Chronicity:  New Ineffective treatments:  None tried Associated symptoms: headaches and sinus pain   Associated symptoms: no wheezing   Risk factors comment:  Chronic smoker   Past Medical History  Diagnosis Date  . Herpes     H/O  . Hypercholesterolemia   . History of abnormal Pap smear    Past Surgical History  Procedure Laterality Date  . Anterior cruciate ligament repair Left 2009  . Novasure ablation    . Cervical biopsy  w/ loop electrode excision     Family History  Problem Relation Age of Onset  . Arthritis Mother   . Hyperlipidemia Mother   . Hypertension Mother   . Cancer Father     prostate  . Hyperlipidemia Father   . Hypertension Father   . Alcohol abuse Maternal Grandfather   . Breast cancer Neg Hx    Social History  Substance Use Topics  . Smoking status: Current Every Day Smoker  . Smokeless tobacco: Never Used     Comment: 2 a day  . Alcohol Use: 0.0 oz/week    0 Standard drinks or equivalent per week   OB History    No data available     Review of Systems  HENT: Positive for congestion and sore throat.   Respiratory: Positive for cough. Negative for wheezing.   Neurological: Positive for headaches.    Allergies  Ampicillin  Home Medications   Prior to Admission medications   Medication Sig Start Date End Date Taking? Authorizing Provider  azithromycin (ZITHROMAX Z-PAK) 250 MG tablet 2 tabs po once day 1, then 1 tab  po qd for next 4 days 09/01/15   Norval Gable, MD  citalopram (CELEXA) 20 MG tablet Take 1 tablet (20 mg total) by mouth daily. 06/23/15   Einar Pheasant, MD  meloxicam (MOBIC) 15 MG tablet Take 1 tablet (15 mg total) by mouth daily. 05/16/15   Max T Hyatt, DPM  valACYclovir (VALTREX) 500 MG tablet Take 1 tablet (500 mg total) by mouth daily. 06/23/15   Einar Pheasant, MD   Meds Ordered and Administered this Visit  Medications - No data to display  BP 103/70 mmHg  Pulse 82  Temp(Src) 98 F (36.7 C) (Tympanic)  Ht 5\' 7"  (1.702 m)  Wt 163 lb (73.936 kg)  BMI 25.52 kg/m2  SpO2 98% No data found.   Physical Exam  Constitutional: She appears well-developed and well-nourished. No distress.  HENT:  Head: Normocephalic and atraumatic.  Right Ear: Tympanic membrane, external ear and ear canal normal.  Left Ear: Tympanic membrane, external ear and ear canal normal.  Nose: Mucosal edema and rhinorrhea present. No nose lacerations, sinus tenderness, nasal deformity, septal deviation or nasal septal hematoma. No epistaxis.  No foreign bodies. Right sinus exhibits maxillary sinus tenderness and frontal sinus tenderness. Left sinus exhibits maxillary sinus tenderness and frontal sinus tenderness.  Mouth/Throat: Uvula is midline, oropharynx is clear and moist and mucous membranes are normal. No oropharyngeal exudate.  Eyes: Conjunctivae  and EOM are normal. Pupils are equal, round, and reactive to light. Right eye exhibits no discharge. Left eye exhibits no discharge. No scleral icterus.  Neck: Normal range of motion. Neck supple. No thyromegaly present.  Cardiovascular: Normal rate, regular rhythm and normal heart sounds.   Pulmonary/Chest: Effort normal and breath sounds normal. No respiratory distress. She has no wheezes. She has no rales.  Lymphadenopathy:    She has no cervical adenopathy.  Skin: She is not diaphoretic.  Nursing note and vitals reviewed.   ED Course  Procedures (including  critical care time)  Labs Review Labs Reviewed - No data to display  Imaging Review No results found.   Visual Acuity Review  Right Eye Distance:   Left Eye Distance:   Bilateral Distance:    Right Eye Near:   Left Eye Near:    Bilateral Near:         MDM   1. Bronchitis   2. Acute maxillary sinusitis, recurrence not specified    Discharge Medication List as of 09/01/2015  5:00 PM    START taking these medications   Details  azithromycin (ZITHROMAX Z-PAK) 250 MG tablet 2 tabs po once day 1, then 1 tab po qd for next 4 days, Normal       1.  diagnosis reviewed with patient/parent/guardian/family 2. rx as per orders above; reviewed possible side effects, interactions, risks and benefits  3. Recommend supportive treatment with otc analgesics, otc cough medicine; increased fluids, rest 4. Follow-up prn if symptoms worsen or don't improve   Norval Gable, MD 09/01/15 [phone]

## 2015-10-16 ENCOUNTER — Encounter: Payer: Self-pay | Admitting: Internal Medicine

## 2015-10-19 NOTE — Telephone Encounter (Signed)
I have sent in a rx for the daughter Martinique Crabbe.  Please contact pt ([place]) and confirm bleeding and let her know that I have sent in a new ocp.  pts mother is not listed on her chart to discuss her care.

## 2015-10-19 NOTE — Telephone Encounter (Signed)
This was sent back to me.  See my message.  Need to contact daughter and confirm no other issues and to let her know that I sent in the rx for the ocp's.  Her mother sent the message.  She is not on her list to talk to.  pts name listed in my chart message.

## 2015-10-20 NOTE — Telephone Encounter (Signed)
This keeps getting sent back to me.  See note for contacting daughter.  Thanks.

## 2015-10-25 ENCOUNTER — Other Ambulatory Visit: Payer: Self-pay | Admitting: Internal Medicine

## 2015-11-25 ENCOUNTER — Other Ambulatory Visit: Payer: Self-pay | Admitting: Internal Medicine

## 2015-12-28 ENCOUNTER — Encounter: Payer: Self-pay | Admitting: Internal Medicine

## 2015-12-28 ENCOUNTER — Ambulatory Visit (INDEPENDENT_AMBULATORY_CARE_PROVIDER_SITE_OTHER): Payer: BC Managed Care – PPO | Admitting: Internal Medicine

## 2015-12-28 VITALS — BP 100/70 | HR 85 | Temp 98.3°F | Resp 18 | Ht 66.0 in | Wt 167.2 lb

## 2015-12-28 DIAGNOSIS — F439 Reaction to severe stress, unspecified: Secondary | ICD-10-CM

## 2015-12-28 DIAGNOSIS — B009 Herpesviral infection, unspecified: Secondary | ICD-10-CM

## 2015-12-28 DIAGNOSIS — E669 Obesity, unspecified: Secondary | ICD-10-CM | POA: Diagnosis not present

## 2015-12-28 DIAGNOSIS — M25569 Pain in unspecified knee: Secondary | ICD-10-CM

## 2015-12-28 DIAGNOSIS — Z0000 Encounter for general adult medical examination without abnormal findings: Secondary | ICD-10-CM

## 2015-12-28 DIAGNOSIS — Z658 Other specified problems related to psychosocial circumstances: Secondary | ICD-10-CM | POA: Diagnosis not present

## 2015-12-28 DIAGNOSIS — E7800 Pure hypercholesterolemia, unspecified: Secondary | ICD-10-CM

## 2015-12-28 DIAGNOSIS — R87619 Unspecified abnormal cytological findings in specimens from cervix uteri: Secondary | ICD-10-CM

## 2015-12-28 DIAGNOSIS — Z1211 Encounter for screening for malignant neoplasm of colon: Secondary | ICD-10-CM

## 2015-12-28 MED ORDER — VALACYCLOVIR HCL 500 MG PO TABS: 500.0000 mg | ORAL_TABLET | Freq: Every day | ORAL | Status: DC

## 2015-12-28 MED ORDER — CITALOPRAM HYDROBROMIDE 20 MG PO TABS: ORAL_TABLET | ORAL | Status: DC

## 2015-12-28 NOTE — Progress Notes (Signed)
Patient ID: Maria Munoz, female   DOB: [birth year], 50 y.o.   MRN: [id number]   Subjective:    Patient ID: Maria Munoz, female    DOB: [birth year], 50 y.o.   MRN: [id number]  HPI  Patient here for her physical exam.  She is still having issues with her knee.  Is s/p cortisone injection and synvisc injection.  Right knee - bone on bone.  Plans to f/u with ortho at the end of the summer for discussion about surgery.  Exercising - total gym.  No chest pain or tightness. No sob.  No acid reflux.  No abdominal pain or cramping.  Bowels stable.     Past Medical History  Diagnosis Date  . Herpes     H/O  . Hypercholesterolemia   . History of abnormal Pap smear    Past Surgical History  Procedure Laterality Date  . Anterior cruciate ligament repair Left 2009  . Novasure ablation    . Cervical biopsy  w/ loop electrode excision     Family History  Problem Relation Age of Onset  . Arthritis Mother   . Hyperlipidemia Mother   . Hypertension Mother   . Cancer Father     prostate  . Hyperlipidemia Father   . Hypertension Father   . Alcohol abuse Maternal Grandfather   . Breast cancer Neg Hx    Social History   Social History  . Marital Status: Married    Spouse Name: N/A  . Number of Children: N/A  . Years of Education: N/A   Social History Main Topics  . Smoking status: Current Every Day Smoker  . Smokeless tobacco: Never Used     Comment: 2 a day  . Alcohol Use: 0.0 oz/week    0 Standard drinks or equivalent per week  . Drug Use: No  . Sexual Activity: Not Asked   Other Topics Concern  . None   Social History Narrative    Outpatient Encounter Prescriptions as of 12/28/2015  Medication Sig  . citalopram (CELEXA) 20 MG tablet Take 1 tablet (20 mg total) by mouth daily.  . meloxicam (MOBIC) 15 MG tablet Take 1 tablet (15 mg total) by mouth daily.  . valACYclovir (VALTREX) 500 MG tablet Take 1 tablet (500 mg total) by mouth daily.  . [DISCONTINUED] citalopram (CELEXA)  20 MG tablet Take 1 tablet (20 mg total) by mouth daily.  . [DISCONTINUED] valACYclovir (VALTREX) 500 MG tablet Take 1 tablet (500 mg total) by mouth daily.  . [DISCONTINUED] azithromycin (ZITHROMAX Z-PAK) 250 MG tablet 2 tabs po once day 1, then 1 tab po qd for next 4 days   No facility-administered encounter medications on file as of 12/28/2015.    Review of Systems  Constitutional: Negative for appetite change and unexpected weight change.  HENT: Negative for congestion and sinus pressure.   Eyes: Negative for pain and visual disturbance.  Respiratory: Negative for cough, chest tightness and shortness of breath.   Cardiovascular: Negative for chest pain, palpitations and leg swelling.  Gastrointestinal: Negative for nausea, vomiting, abdominal pain and diarrhea.  Genitourinary: Negative for dysuria and difficulty urinating.  Musculoskeletal: Negative for back pain.       Right knee pain as outlined.    Skin: Negative for color change and rash.  Neurological: Negative for dizziness, light-headedness and headaches.  Hematological: Negative for adenopathy. Does not bruise/bleed easily.  Psychiatric/Behavioral: Negative for dysphoric mood and agitation.       Objective:  Blood pressure rechecked by me:  128/78  Physical Exam  Constitutional: She is oriented to person, place, and time. She appears well-developed and well-nourished. No distress.  HENT:  Nose: Nose normal.  Mouth/Throat: Oropharynx is clear and moist.  Eyes: Right eye exhibits no discharge. Left eye exhibits no discharge. No scleral icterus.  Neck: Neck supple. No thyromegaly present.  Cardiovascular: Normal rate and regular rhythm.   Pulmonary/Chest: Breath sounds normal. No accessory muscle usage. No tachypnea. No respiratory distress. She has no decreased breath sounds. She has no wheezes. She has no rhonchi. Right breast exhibits no inverted nipple, no mass, no nipple discharge and no tenderness (no axillary  adenopathy). Left breast exhibits no inverted nipple, no mass, no nipple discharge and no tenderness (no axilarry adenopathy).  Abdominal: Soft. Bowel sounds are normal. There is no tenderness.  Musculoskeletal: She exhibits no edema or tenderness.  Lymphadenopathy:    She has no cervical adenopathy.  Neurological: She is alert and oriented to person, place, and time.  Skin: Skin is warm. No rash noted. No erythema.  Psychiatric: She has a normal mood and affect. Her behavior is normal.    BP 100/70 mmHg  Pulse 85  Temp(Src) 98.3 F (36.8 C) (Oral)  Resp 18  Ht 5\' 6"  (1.676 m)  Wt 167 lb 4 oz (75.864 kg)  BMI 27.01 kg/m2  SpO2 94% Wt Readings from Last 3 Encounters:  12/28/15 167 lb 4 oz (75.864 kg)  09/01/15 163 lb (73.936 kg)  06/23/15 163 lb (73.936 kg)     Lab Results  Component Value Date   WBC 10.4 02/10/2015   HGB 13.4 02/10/2015   HCT 39.4 02/10/2015   PLT 267.0 02/10/2015   GLUCOSE 92 01/18/2015   CHOL 199 01/18/2015   TRIG 56.0 01/18/2015   HDL 102.90 01/18/2015   LDLDIRECT 104.1 07/23/2013   LDLCALC 85 01/18/2015   ALT 15 01/18/2015   AST 14 01/18/2015   NA 135 01/18/2015   K 3.7 01/18/2015   CL 101 01/18/2015   CREATININE 0.54 01/18/2015   BUN 12 01/18/2015   CO2 28 01/18/2015   TSH 1.27 01/18/2015       Assessment & Plan:   Problem List Items Addressed This Visit    Abnormal Pap smear of cervix    PAP 12/15/13 - negative with negative HPV.  Discussed f/u pap today.  She will wait on pap.        Health care maintenance    Physical today 12/28/15.  PAP 12/15/13 - negative with negative HPV.  Mammogram 07/11/15 - Birads I.        Herpes    Refilled valtrex.        Relevant Medications   valACYclovir (VALTREX) 500 MG tablet   Hypercholesterolemia    Low cholesterol diet and exercise.  Follow lipid panel.        Relevant Orders   Comprehensive metabolic panel   Lipid panel   Knee pain    Seeing ortho.  Is s/p cortisone injection and  synvisc injection.  Still with pain.  Planning to follow up with ortho to discuss surgery.        Obesity (BMI 30.0-34.9)    Diet and exercise.        Stress    On citalopram.  Feels she is handling things relatively well.  Follow.        Relevant Orders   CBC with Differential/Platelet   TSH    Other Visit Diagnoses  Routine general medical examination at a health care facility    -  Primary    Colon cancer screening        Relevant Orders    Ambulatory referral to Gastroenterology        Einar Pheasant, MD

## 2015-12-28 NOTE — Progress Notes (Signed)
Pre-visit discussion using our clinic review tool. No additional management support is needed unless otherwise documented below in the visit note.  

## 2015-12-31 ENCOUNTER — Encounter: Payer: Self-pay | Admitting: Internal Medicine

## 2015-12-31 NOTE — Assessment & Plan Note (Signed)
On citalopram.  Feels she is handling things relatively well.  Follow.

## 2015-12-31 NOTE — Assessment & Plan Note (Signed)
Low cholesterol diet and exercise.  Follow lipid panel.   

## 2015-12-31 NOTE — Assessment & Plan Note (Signed)
Refilled valtrex  

## 2015-12-31 NOTE — Assessment & Plan Note (Signed)
Diet and exercise.   

## 2015-12-31 NOTE — Assessment & Plan Note (Signed)
PAP 12/15/13 - negative with negative HPV.  Discussed f/u pap today.  She will wait on pap.

## 2015-12-31 NOTE — Assessment & Plan Note (Signed)
Seeing ortho.  Is s/p cortisone injection and synvisc injection.  Still with pain.  Planning to follow up with ortho to discuss surgery.

## 2015-12-31 NOTE — Assessment & Plan Note (Signed)
Physical today 12/28/15.  PAP 12/15/13 - negative with negative HPV.  Mammogram 07/11/15 - Birads I.

## 2016-01-17 ENCOUNTER — Other Ambulatory Visit: Payer: BC Managed Care – PPO

## 2016-01-31 ENCOUNTER — Other Ambulatory Visit: Payer: BC Managed Care – PPO

## 2016-03-12 ENCOUNTER — Encounter: Payer: Self-pay | Admitting: Internal Medicine

## 2016-03-22 ENCOUNTER — Ambulatory Visit
Admission: RE | Admit: 2016-03-22 | Payer: BC Managed Care – PPO | Source: Ambulatory Visit | Admitting: Gastroenterology

## 2016-03-22 ENCOUNTER — Encounter: Admission: RE | Payer: Self-pay | Source: Ambulatory Visit

## 2016-03-22 SURGERY — COLONOSCOPY WITH PROPOFOL
Anesthesia: General

## 2016-04-29 ENCOUNTER — Other Ambulatory Visit: Payer: Self-pay

## 2016-04-29 ENCOUNTER — Ambulatory Visit (INDEPENDENT_AMBULATORY_CARE_PROVIDER_SITE_OTHER): Payer: BC Managed Care – PPO | Admitting: Internal Medicine

## 2016-04-29 ENCOUNTER — Encounter: Payer: Self-pay | Admitting: Internal Medicine

## 2016-04-29 VITALS — BP 120/70 | HR 78 | Temp 98.0°F | Resp 16 | Wt 168.0 lb

## 2016-04-29 DIAGNOSIS — E669 Obesity, unspecified: Secondary | ICD-10-CM

## 2016-04-29 DIAGNOSIS — M25569 Pain in unspecified knee: Secondary | ICD-10-CM

## 2016-04-29 DIAGNOSIS — Z01818 Encounter for other preprocedural examination: Secondary | ICD-10-CM | POA: Diagnosis not present

## 2016-04-29 DIAGNOSIS — E7800 Pure hypercholesterolemia, unspecified: Secondary | ICD-10-CM | POA: Diagnosis not present

## 2016-04-29 DIAGNOSIS — F439 Reaction to severe stress, unspecified: Secondary | ICD-10-CM

## 2016-04-29 DIAGNOSIS — Z658 Other specified problems related to psychosocial circumstances: Secondary | ICD-10-CM | POA: Diagnosis not present

## 2016-04-29 LAB — CBC WITH DIFFERENTIAL/PLATELET
BASOS ABS: 0.1 10*3/uL (ref 0.0–0.1)
Basophils Relative: 1 % (ref 0.0–3.0)
EOS ABS: 0.2 10*3/uL (ref 0.0–0.7)
Eosinophils Relative: 2.9 % (ref 0.0–5.0)
HEMATOCRIT: 42.3 % (ref 36.0–46.0)
Hemoglobin: 14.3 g/dL (ref 12.0–15.0)
LYMPHS PCT: 27.2 % (ref 12.0–46.0)
Lymphs Abs: 2.4 10*3/uL (ref 0.7–4.0)
MCHC: 33.8 g/dL (ref 30.0–36.0)
MCV: 93.9 fl (ref 78.0–100.0)
Monocytes Absolute: 0.4 10*3/uL (ref 0.1–1.0)
Monocytes Relative: 4.8 % (ref 3.0–12.0)
NEUTROS ABS: 5.6 10*3/uL (ref 1.4–7.7)
Neutrophils Relative %: 64.1 % (ref 43.0–77.0)
PLATELETS: 266 10*3/uL (ref 150.0–400.0)
RBC: 4.51 Mil/uL (ref 3.87–5.11)
RDW: 14 % (ref 11.5–15.5)
WBC: 8.7 10*3/uL (ref 4.0–10.5)

## 2016-04-29 LAB — COMPREHENSIVE METABOLIC PANEL
ALT: 19 U/L (ref 0–35)
AST: 16 U/L (ref 0–37)
Albumin: 4.2 g/dL (ref 3.5–5.2)
Alkaline Phosphatase: 51 U/L (ref 39–117)
BILIRUBIN TOTAL: 0.5 mg/dL (ref 0.2–1.2)
BUN: 10 mg/dL (ref 6–23)
CALCIUM: 9.4 mg/dL (ref 8.4–10.5)
CO2: 29 meq/L (ref 19–32)
CREATININE: 0.53 mg/dL (ref 0.40–1.20)
Chloride: 104 mEq/L (ref 96–112)
GFR: 129.02 mL/min (ref >60.00)
Glucose, Bld: 111 mg/dL — ABNORMAL HIGH (ref 70–99)
Potassium: 3.9 mEq/L (ref 3.5–5.1)
Sodium: 139 mEq/L (ref 135–145)
Total Protein: 6.7 g/dL (ref 6.0–8.3)

## 2016-04-29 LAB — TSH: TSH: 1.33 u[IU]/mL (ref 0.35–4.50)

## 2016-04-29 NOTE — Assessment & Plan Note (Signed)
Last cholesterol panel wnl.  She has eaten today.  Will follow cholesterol panel.

## 2016-04-29 NOTE — Assessment & Plan Note (Signed)
Plans to get back more in a routine of watching her diet.  Exercise after surgery.  Follow.

## 2016-04-29 NOTE — Assessment & Plan Note (Signed)
On citalopram.  Feels she is handling things relatively well.  Does not feel needs any further intervention.  Follow.

## 2016-04-29 NOTE — Progress Notes (Signed)
Patient ID: Maria Munoz, female   DOB: 1966/08/08, 50 y.o.   MRN: [id number]   Subjective:    Patient ID: Maria Munoz, female    DOB: Feb 22, 1966, 50 y.o.   MRN: [id number]  HPI  Patient here for a scheduled follow up.  She is planning to have total knee arthroscopy on 05/13/16.  Has pre op papers to be completed.  She is doing well.  Feels good.  Tries to stay active.  No cardiac symptoms with increased activity or exertion.  No sob.  No acid reflux.  Bowels doing well.  No abdominal pain or cramping.  Has not been watching her diet as well.  Plans to get more in a routine with her diet.     Past Medical History:  Diagnosis Date  . Herpes    H/O  . History of abnormal Pap smear   . Hypercholesterolemia    Past Surgical History:  Procedure Laterality Date  . ANTERIOR CRUCIATE LIGAMENT REPAIR Left 2009  . CERVICAL BIOPSY  W/ LOOP ELECTRODE EXCISION    . NOVASURE ABLATION     Family History  Problem Relation Age of Onset  . Arthritis Mother   . Hyperlipidemia Mother   . Hypertension Mother   . Cancer Father     prostate  . Hyperlipidemia Father   . Hypertension Father   . Alcohol abuse Maternal Grandfather   . Breast cancer Neg Hx    Social History   Social History  . Marital status: Married    Spouse name: N/A  . Number of children: N/A  . Years of education: N/A   Social History Main Topics  . Smoking status: Current Every Day Smoker    Packs/day: 0.25    Years: 3.00  . Smokeless tobacco: Never Used     Comment: 2 a day  . Alcohol use 0.0 oz/week     Comment: socially  . Drug use: No  . Sexual activity: Not Asked   Other Topics Concern  . None   Social History Narrative  . None    Outpatient Encounter Prescriptions as of 04/29/2016  Medication Sig  . citalopram (CELEXA) 20 MG tablet Take 1 tablet (20 mg total) by mouth daily.  Marland Kitchen ibuprofen (ADVIL,MOTRIN) 200 MG tablet Take by mouth.  . meloxicam (MOBIC) 15 MG tablet Take 1 tablet (15 mg total) by mouth  daily.  . Multiple Vitamin (MULTIVITAMIN) capsule Take 1 capsule by mouth daily.  . valACYclovir (VALTREX) 500 MG tablet Take 1 tablet (500 mg total) by mouth daily.  . [DISCONTINUED] Cyanocobalamin (RA VITAMIN B-12 TR) 1000 MCG TBCR Take by mouth.   No facility-administered encounter medications on file as of 04/29/2016.     Review of Systems  Constitutional: Negative for appetite change and unexpected weight change.  HENT: Negative for congestion and sinus pressure.   Respiratory: Negative for cough, chest tightness and shortness of breath.   Cardiovascular: Negative for chest pain, palpitations and leg swelling.  Gastrointestinal: Negative for abdominal pain, diarrhea, nausea and vomiting.  Genitourinary: Negative for difficulty urinating and dysuria.  Musculoskeletal: Negative for back pain.       Persistent right knee pain.  Planning for surgery.    Skin: Negative for color change and rash.  Neurological: Negative for dizziness, light-headedness and headaches.  Psychiatric/Behavioral: Negative for agitation and dysphoric mood.       Increased stress with work.  Feels she is handling things relatively well.  Objective:    Physical Exam  Constitutional: She appears well-developed and well-nourished. No distress.  HENT:  Nose: Nose normal.  Mouth/Throat: Oropharynx is clear and moist.  Neck: Neck supple. No thyromegaly present.  Cardiovascular: Normal rate and regular rhythm.   Pulmonary/Chest: Breath sounds normal. No respiratory distress. She has no wheezes.  Abdominal: Soft. Bowel sounds are normal. There is no tenderness.  Musculoskeletal: She exhibits no edema or tenderness.  Lymphadenopathy:    She has no cervical adenopathy.  Skin: No rash noted. No erythema.  Psychiatric: She has a normal mood and affect. Her behavior is normal.    BP 120/70 (BP Location: Right Arm, Patient Position: Sitting, Cuff Size: Normal)   Pulse 78   Temp 98 F (36.7 C) (Oral)    Resp 16   Wt 168 lb (76.2 kg)   SpO2 96%   BMI 27.12 kg/m  Wt Readings from Last 3 Encounters:  04/29/16 168 lb (76.2 kg)  12/28/15 167 lb 4 oz (75.9 kg)  09/01/15 163 lb (73.9 kg)     Lab Results  Component Value Date   WBC 10.4 02/10/2015   HGB 13.4 02/10/2015   HCT 39.4 02/10/2015   PLT 267.0 02/10/2015   GLUCOSE 92 01/18/2015   CHOL 199 01/18/2015   TRIG 56.0 01/18/2015   HDL 102.90 01/18/2015   LDLDIRECT 104.1 07/23/2013   LDLCALC 85 01/18/2015   ALT 15 01/18/2015   AST 14 01/18/2015   NA 135 01/18/2015   K 3.7 01/18/2015   CL 101 01/18/2015   CREATININE 0.54 01/18/2015   BUN 12 01/18/2015   CO2 28 01/18/2015   TSH 1.27 01/18/2015       Assessment & Plan:   Problem List Items Addressed This Visit    Hypercholesterolemia    Last cholesterol panel wnl.  She has eaten today.  Will follow cholesterol panel.        Knee pain    Persistent.  Seeing ortho.  Planning for surgery as outlined.  EKG today for pre op - SR with no acute ischemic changes.  Given she is asymptomatic and given EKG with no acute ischemic changes, I feels she is at low risk from a cardiac standpoint to proceed with surgery.  Will need close intra op and post op monitoring of her heart rate and blood pressure to avoid extremes.  Already aware when to stop ibuprofen and mobic.        Obesity (BMI 30.0-34.9)    Plans to get back more in a routine of watching her diet.  Exercise after surgery.  Follow.        Stress    On citalopram.  Feels she is handling things relatively well.  Does not feel needs any further intervention.  Follow.        Other Visit Diagnoses    Pre-op evaluation    -  Primary   Relevant Orders   EKG 12-Lead (Completed)       Einar Pheasant, MD

## 2016-04-29 NOTE — Assessment & Plan Note (Signed)
Persistent.  Seeing ortho.  Planning for surgery as outlined.  EKG today for pre op - SR with no acute ischemic changes.  Given she is asymptomatic and given EKG with no acute ischemic changes, I feels she is at low risk from a cardiac standpoint to proceed with surgery.  Will need close intra op and post op monitoring of her heart rate and blood pressure to avoid extremes.  Already aware when to stop ibuprofen and mobic.

## 2016-05-01 ENCOUNTER — Encounter: Payer: Self-pay | Admitting: Internal Medicine

## 2016-05-02 ENCOUNTER — Ambulatory Visit: Payer: Self-pay | Admitting: Orthopedic Surgery

## 2016-05-03 ENCOUNTER — Encounter (HOSPITAL_COMMUNITY): Payer: Self-pay

## 2016-05-03 ENCOUNTER — Encounter (HOSPITAL_COMMUNITY)
Admission: RE | Admit: 2016-05-03 | Discharge: 2016-05-03 | Disposition: A | Discharge status: Home / Self Care | Payer: BC Managed Care – PPO | Source: Ambulatory Visit | Attending: Orthopedic Surgery | Admitting: Orthopedic Surgery

## 2016-05-03 DIAGNOSIS — Z01812 Encounter for preprocedural laboratory examination: Secondary | ICD-10-CM | POA: Diagnosis not present

## 2016-05-03 HISTORY — DX: Unspecified osteoarthritis, unspecified site: M19.90

## 2016-05-03 LAB — URINALYSIS, ROUTINE W REFLEX MICROSCOPIC
BILIRUBIN URINE: NEGATIVE
GLUCOSE, UA: NEGATIVE mg/dL
HGB URINE DIPSTICK: NEGATIVE
KETONES UR: NEGATIVE mg/dL
Leukocytes, UA: NEGATIVE
Nitrite: NEGATIVE
PROTEIN: NEGATIVE mg/dL
Specific Gravity, Urine: 1.009 (ref 1.005–1.030)
pH: 7.5 (ref 5.0–8.0)

## 2016-05-03 LAB — SURGICAL PCR SCREEN
MRSA, PCR: NEGATIVE
Staphylococcus aureus: POSITIVE — AB

## 2016-05-03 LAB — HCG, SERUM, QUALITATIVE: Preg, Serum: NEGATIVE

## 2016-05-03 LAB — ABO/RH: ABO/RH(D): A POS

## 2016-05-03 LAB — PROTIME-INR
INR: 0.84
Prothrombin Time: 11.5 seconds (ref 11.4–15.2)

## 2016-05-03 LAB — APTT: aPTT: 30 seconds (ref 24–36)

## 2016-05-03 NOTE — Patient Instructions (Signed)
Maria Munoz  05/03/2016   Your procedure is scheduled on: 05/13/16  Report to [facility] Main  Entrance take [facility]  elevators to 3rd floor to  [place] at 8:35 AM.  Call this number if you have problems the morning of surgery [phone]   Remember: ONLY 1 PERSON MAY GO WITH YOU TO SHORT STAY TO GET  READY MORNING OF [place].  Do not eat food or drink liquids :After Midnight.     Take these medicines the morning of surgery with A SIP OF WATER: Citalopram (Celexa), Valacyclovir (Valtrex)                               You may not have any metal on your body including hair pins and              piercings  Do not wear jewelry, make-up, lotions, powders or perfumes, deodorant             Do not wear nail polish.  Do not shave  48 hours prior to surgery.              Men may shave face and neck.   Do not bring valuables to the hospital. [place].  Contacts, dentures or bridgework may not be worn into surgery.  Leave suitcase in the car. After surgery it may be brought to your room.               Please read over the following fact sheets you were given: _____________________________________________________________________             [place] [facility] - Preparing for Surgery Before surgery, you can play an important role.  Because skin is not sterile, your skin needs to be as free of germs as possible.  You can reduce the number of germs on your skin by washing with CHG (chlorahexidine gluconate) soap before surgery.  CHG is an antiseptic cleaner which kills germs and bonds with the skin to continue killing germs even after washing. Please DO NOT use if you have an allergy to CHG or antibacterial soaps.  If your skin becomes reddened/irritated stop using the CHG and inform your nurse when you arrive at Short Stay. Do not shave (including legs and underarms) for at least 48 hours prior to the first CHG  shower.  You may shave your face/neck. Please follow these instructions carefully:  1.  Shower with CHG Soap the night before surgery and the  morning of Surgery.  2.  If you choose to wash your hair, wash your hair first as usual with your  normal  shampoo.  3.  After you shampoo, rinse your hair and body thoroughly to remove the  shampoo.                           4.  Use CHG as you would any other liquid soap.  You can apply chg directly  to the skin and wash                       Gently with a scrungie or clean washcloth.  5.  Apply the CHG Soap to  your body ONLY FROM THE NECK DOWN.   Do not use on face/ open                           Wound or open sores. Avoid contact with eyes, ears mouth and genitals (private parts).                       Wash face,  Genitals (private parts) with your normal soap.             6.  Wash thoroughly, paying special attention to the area where your surgery  will be performed.  7.  Thoroughly rinse your body with warm water from the neck down.  8.  DO NOT shower/wash with your normal soap after using and rinsing off  the CHG Soap.                9.  Pat yourself dry with a clean towel.            10.  Wear clean pajamas.            11.  Place clean sheets on your bed the night of your first shower and do not  sleep with pets. Day of Surgery : Do not apply any lotions/deodorants the morning of surgery.  Please wear clean clothes to the hospital/surgery center.  FAILURE TO FOLLOW THESE INSTRUCTIONS MAY RESULT IN THE CANCELLATION OF YOUR SURGERY PATIENT SIGNATURE_________________________________  NURSE SIGNATURE__________________________________  ________________________________________________________________________   Adam Phenix  An incentive spirometer is a tool that can help keep your lungs clear and active. This tool measures how well you are filling your lungs with each breath. Taking long deep breaths may help reverse or decrease the chance  of developing breathing (pulmonary) problems (especially infection) following:  A long period of time when you are unable to move or be active. BEFORE THE PROCEDURE   If the spirometer includes an indicator to show your best effort, your nurse or respiratory therapist will set it to a desired goal.  If possible, sit up straight or lean slightly forward. Try not to slouch.  Hold the incentive spirometer in an upright position. INSTRUCTIONS FOR USE  1. Sit on the edge of your bed if possible, or sit up as far as you can in bed or on a chair. 2. Hold the incentive spirometer in an upright position. 3. Breathe out normally. 4. Place the mouthpiece in your mouth and seal your lips tightly around it. 5. Breathe in slowly and as deeply as possible, raising the piston or the ball toward the top of the column. 6. Hold your breath for 3-5 seconds or for as long as possible. Allow the piston or ball to fall to the bottom of the column. 7. Remove the mouthpiece from your mouth and breathe out normally. 8. Rest for a few seconds and repeat Steps 1 through 7 at least 10 times every 1-2 hours when you are awake. Take your time and take a few normal breaths between deep breaths. 9. The spirometer may include an indicator to show your best effort. Use the indicator as a goal to work toward during each repetition. 10. After each set of 10 deep breaths, practice coughing to be sure your lungs are clear. If you have an incision (the cut made at the time of surgery), support your incision when coughing by placing a pillow or rolled up towels firmly against  it. Once you are able to get out of bed, walk around indoors and cough well. You may stop using the incentive spirometer when instructed by your caregiver.  RISKS AND COMPLICATIONS  Take your time so you do not get dizzy or light-headed.  If you are in pain, you may need to take or ask for pain medication before doing incentive spirometry. It is harder to  take a deep breath if you are having pain. AFTER USE  Rest and breathe slowly and easily.  It can be helpful to keep track of a log of your progress. Your caregiver can provide you with a simple table to help with this. If you are using the spirometer at home, follow these instructions: [place] IF:   You are having difficultly using the spirometer.  You have trouble using the spirometer as often as instructed.  Your pain medication is not giving enough relief while using the spirometer.  You develop fever of 100.5 F (38.1 C) or higher. SEEK IMMEDIATE MEDICAL CARE IF:   You cough up bloody sputum that had not been present before.  You develop fever of 102 F (38.9 C) or greater.  You develop worsening pain at or near the incision site. MAKE SURE YOU:   Understand these instructions.  Will watch your condition.  Will get help right away if you are not doing well or get worse. Document Released: 12/30/2006 Document Revised: 11/11/2011 Document Reviewed: 03/02/2007 ExitCare Patient Information 2014 ExitCare, [place].   ________________________________________________________________________  WHAT IS A BLOOD TRANSFUSION? Blood Transfusion Information  A transfusion is the replacement of blood or some of its parts. Blood is made up of multiple cells which provide different functions.  Red blood cells carry oxygen and are used for blood loss replacement.  White blood cells fight against infection.  Platelets control bleeding.  Plasma helps clot blood.  Other blood products are available for specialized needs, such as hemophilia or other clotting disorders. BEFORE THE TRANSFUSION  Who gives blood for transfusions?   Healthy volunteers who are fully evaluated to make sure their blood is safe. This is blood bank blood. Transfusion therapy is the safest it has ever been in the practice of medicine. Before blood is taken from a donor, a complete history is taken to  make sure that person has no history of diseases nor engages in risky social behavior (examples are intravenous drug use or sexual activity with multiple partners). The donor's travel history is screened to minimize risk of transmitting infections, such as malaria. The donated blood is tested for signs of infectious diseases, such as HIV and hepatitis. The blood is then tested to be sure it is compatible with you in order to minimize the chance of a transfusion reaction. If you or a relative donates blood, this is often done in anticipation of surgery and is not appropriate for emergency situations. It takes many days to process the donated blood. RISKS AND COMPLICATIONS Although transfusion therapy is very safe and saves many lives, the main dangers of transfusion include:   Getting an infectious disease.  Developing a transfusion reaction. This is an allergic reaction to something in the blood you were given. Every precaution is taken to prevent this. The decision to have a blood transfusion has been considered carefully by your caregiver before blood is given. Blood is not given unless the benefits outweigh the risks. AFTER THE TRANSFUSION  Right after receiving a blood transfusion, you will usually feel much better and more  energetic. This is especially true if your red blood cells have gotten low (anemic). The transfusion raises the level of the red blood cells which carry oxygen, and this usually causes an energy increase.  The nurse administering the transfusion will monitor you carefully for complications. HOME CARE INSTRUCTIONS  No special instructions are needed after a transfusion. You may find your energy is better. Speak with your caregiver about any limitations on activity for underlying diseases you may have. SEEK MEDICAL CARE IF:   Your condition is not improving after your transfusion.  You develop redness or irritation at the intravenous (IV) site. SEEK IMMEDIATE MEDICAL CARE  IF:  Any of the following symptoms occur over the next 12 hours:  Shaking chills.  You have a temperature by mouth above 102 F (38.9 C), not controlled by medicine.  Chest, back, or muscle pain.  People around you feel you are not acting correctly or are confused.  Shortness of breath or difficulty breathing.  Dizziness and fainting.  You get a rash or develop hives.  You have a decrease in urine output.  Your urine turns a dark color or changes to pink, red, or brown. Any of the following symptoms occur over the next 10 days:  You have a temperature by mouth above 102 F (38.9 C), not controlled by medicine.  Shortness of breath.  Weakness after normal activity.  The white part of the eye turns yellow (jaundice).  You have a decrease in the amount of urine or are urinating less often.  Your urine turns a dark color or changes to pink, red, or brown. Document Released: 08/16/2000 Document Revised: 11/11/2011 Document Reviewed: 04/04/2008 [facility] Patient Information 2014 [place], [place].  _______________________________________________________________________

## 2016-05-03 NOTE — Pre-Procedure Instructions (Signed)
CMP, CBC/diff 04/29/16 epic

## 2016-05-07 NOTE — Telephone Encounter (Signed)
Unread mychart message mailed to patient 

## 2016-05-12 ENCOUNTER — Ambulatory Visit: Payer: Self-pay | Admitting: Orthopedic Surgery

## 2016-05-12 NOTE — H&P (Signed)
Maria Munoz DOB: Feb 25, 1966 Divorced / Language: Maria Munoz / Race: White Female Date of Admission:  05/13/2016 CC:  Right knee pain History of Present Illness  The patient is a 50 year old female who comes in for a preoperative History and Physical. The patient is scheduled for a right total knee arthroplasty to be performed by Dr. Dione Munoz. Aluisio, MD at [facility] on 05/13/2016. The patient is a 50 year old female who is being followed for their right knee pain and osteoarthritis. They are now out from post cortisone injection. Symptoms reported include: pain, aching, popping, grinding, giving way and pain with weightbearing. The patient feels that they are doing poorly and report their pain level to be 4 / 10. Current treatment includes: NSAIDs. The following medication has been used for pain control: Aleve (w/ Mobic). The patient has reported improvement of their symptoms with: Cortisone injections (helped for 2 weeks). The patient indicates that they are ready to discuss surgery. Note for "Follow-up Knee": Patient states that she did not find much relief in the injections. She only got about two weeks or reflief with the cortisone shot. She states that she has tried the gel before and no benefit either. At this point she is ready to proceed with surgery at this time.  We have gone over the procedure, the hospital stay, the importance of therapy and she wishes to proceed with surgery. She has helped her mother, Maria Munoz, go through both knees in the past so she already has an idea about the postoperative course. She feels as though the right knee is getting worse. The injections did not help. She is ready to proceed with surgery. They have been treated conservatively in the past for the above stated problem and despite conservative measures, they continue to have progressive pain and severe functional limitations and dysfunction. They have failed non-operative management including home  exercise, medications, and injections. It is felt that they would benefit from undergoing total joint replacement. Risks and benefits of the procedure have been discussed with the patient and they elect to proceed with surgery. There are no active contraindications to surgery such as ongoing infection or rapidly progressive neurological disease.  Problem List/Past Medical  Primary osteoarthritis of left knee (M17.12)  Right knee pain (M25.561)  Anxiety Disorder  Allergies  Ampicillin *PENICILLINS*  Hives. as a child  Family History Diabetes Mellitus  Mother. mother Cancer  Father. father Osteoporosis  mother Cerebrovascular Accident  Father. father Osteoarthritis  Mother. mother Hypertension  Father. mother and father  Social History  Drug/Alcohol Rehab (Previously)  no Drug/Alcohol Rehab (Currently)  no Not under pain contract  Exercise  Exercises daily; does running / walking and gym / weights Exercises weekly; does other Tobacco use  Former smoker. former smoker; smoke(d) less than 1/2 pack(s) per day Tobacco / smoke exposure  no Number of flights of stairs before winded  4-5 Pain Contract  no Marital status  divorced Living situation  live alone Illicit drug use  no No history of drug/alcohol rehab  Current work status  working full time; Engineer, [employer] Current drinker  05/03/2014: Currently drinks beer only occasionally per week Children  2 Alcohol use  current drinker; drinks beer; only occasionally per week current drinker; drinks beer and wine; only occasionally per week  Medication History Mobic (15MG  Tablet, 1 (one) Oral 1 PO QD WITH FOOD, Taken starting 10/03/2015) Active. (PAB/JAF RX SENT VIA ESCRIBE) ValACYclovir HCl (500MG  Tablet, Oral) Active.  Citalopram Hydrobromide (20MG  Tablet, Oral) Active. Phentermine HCl (Oral) Specific strength unknown - Active. Biotin (Oral) Specific strength unknown -  Active. Multiple Vitamin (1 Oral) Active. Aleve Active.   Past Surgical History Arthroscopy of Knee  left Tubal Ligation  Tonsillectomy  Left Knee ACL Reconstruction   Review of Systems  General Not Present- Chills, Fatigue, Fever, Memory Loss, Night Sweats, Weight Gain and Weight Loss. Skin Not Present- Eczema, Hives, Itching, Lesions and Rash. HEENT Not Present- Dentures, Double Vision, Headache, Hearing Loss, Tinnitus and Visual Loss. Respiratory Not Present- Allergies, Chronic Cough, Coughing up blood, Shortness of breath at rest and Shortness of breath with exertion. Cardiovascular Not Present- Chest Pain, Difficulty Breathing Lying Down, Murmur, Palpitations, Racing/skipping heartbeats and Swelling. Gastrointestinal Not Present- Abdominal Pain, Bloody Stool, Constipation, Diarrhea, Difficulty Swallowing, Heartburn, Jaundice, Loss of appetitie, Nausea and Vomiting. Female Genitourinary Not Present- Blood in Urine, Discharge, Flank Pain, Incontinence, Painful Urination, Urgency, Urinary frequency, Urinary Retention, Urinating at Night and Weak urinary stream. Musculoskeletal Present- Joint Pain. Not Present- Back Pain, Joint Swelling, Morning Stiffness, Muscle Pain, Muscle Weakness and Spasms. Neurological Not Present- Blackout spells, Difficulty with balance, Dizziness, Paralysis, Tremor and Weakness. Psychiatric Not Present- Insomnia.  Vitals  Weight: 170 lb Height: 66in Weight was reported by patient. Height was reported by patient. Body Surface Area: 1.87 m Body Mass Index: 27.44 kg/m  Pulse: 68 (Regular)  BP: 108/66 (Sitting, Right Arm, Standard)  Physical Exam General Mental Status -Alert, cooperative and good historian. General Appearance-pleasant, Not in acute distress. Orientation-Oriented X3. Build & Nutrition-Well nourished and Well developed.  Head and Neck Head-normocephalic, atraumatic . Neck Global Assessment - supple, no bruit  auscultated on the right, no bruit auscultated on the left.  Eye Vision-Wears contact lenses. Pupil - Bilateral-Regular and Round. Motion - Bilateral-EOMI.  Chest and Lung Exam Auscultation Breath sounds - clear at anterior chest wall and clear at posterior chest wall. Adventitious sounds - No Adventitious sounds.  Cardiovascular Auscultation Rhythm - Regular rate and rhythm. Heart Sounds - S1 WNL and S2 WNL. Murmurs & Other Heart Sounds - Auscultation of the heart reveals - No Murmurs.  Abdomen Palpation/Percussion Tenderness - Abdomen is non-tender to palpation. Rigidity (guarding) - Abdomen is soft. Auscultation Auscultation of the abdomen reveals - Bowel sounds normal.  Female Genitourinary Note: Not done, not pertinent to present illness   Musculoskeletal Note: On exam, she is alert and oriented, in no apparent distress. Her right knee shows no effusion. She has got a valgus deformity. Range of motion about 5 to 125 with tenderness lateral greater than medial with no instability.  Assessment & Plan Primary osteoarthritis of right knee (M17.11)  Note:Surgical Plans: Right Total Knee Replacement  Disposition: Home  PCP: Dr. Einar Pheasant  IV TXA  Anesthesia Issues: None  VERITAS STUDY PATIENT Virtual Therapy  Signed electronically by Ok Edwards, III PA-C

## 2016-05-13 ENCOUNTER — Inpatient Hospital Stay (HOSPITAL_COMMUNITY)
Admission: RE | Admit: 2016-05-13 | Discharge: 2016-05-15 | DRG: 470 | Disposition: A | Discharge status: Home / Self Care | Payer: BC Managed Care – PPO | Source: Ambulatory Visit | Attending: Orthopedic Surgery | Admitting: Orthopedic Surgery

## 2016-05-13 ENCOUNTER — Encounter (HOSPITAL_COMMUNITY): Payer: Self-pay | Admitting: *Deleted

## 2016-05-13 ENCOUNTER — Encounter (HOSPITAL_COMMUNITY)
Admission: RE | Disposition: A | Discharge status: Home / Self Care | Payer: Self-pay | Source: Ambulatory Visit | Attending: Orthopedic Surgery

## 2016-05-13 ENCOUNTER — Inpatient Hospital Stay (HOSPITAL_COMMUNITY): Payer: BC Managed Care – PPO | Admitting: Anesthesiology

## 2016-05-13 DIAGNOSIS — Z79899 Other long term (current) drug therapy: Secondary | ICD-10-CM

## 2016-05-13 DIAGNOSIS — Z8261 Family history of arthritis: Secondary | ICD-10-CM

## 2016-05-13 DIAGNOSIS — M25561 Pain in right knee: Secondary | ICD-10-CM | POA: Diagnosis present

## 2016-05-13 DIAGNOSIS — M21061 Valgus deformity, not elsewhere classified, right knee: Secondary | ICD-10-CM | POA: Diagnosis present

## 2016-05-13 DIAGNOSIS — E7800 Pure hypercholesterolemia, unspecified: Secondary | ICD-10-CM | POA: Diagnosis present

## 2016-05-13 DIAGNOSIS — M1711 Unilateral primary osteoarthritis, right knee: Secondary | ICD-10-CM | POA: Diagnosis present

## 2016-05-13 DIAGNOSIS — B009 Herpesviral infection, unspecified: Secondary | ICD-10-CM | POA: Diagnosis present

## 2016-05-13 DIAGNOSIS — M179 Osteoarthritis of knee, unspecified: Secondary | ICD-10-CM | POA: Diagnosis present

## 2016-05-13 DIAGNOSIS — M1710 Unilateral primary osteoarthritis, unspecified knee: Secondary | ICD-10-CM | POA: Diagnosis present

## 2016-05-13 HISTORY — PX: TOTAL KNEE ARTHROPLASTY: SHX125

## 2016-05-13 LAB — TYPE AND SCREEN
ABO/RH(D): A POS
ANTIBODY SCREEN: NEGATIVE

## 2016-05-13 SURGERY — ARTHROPLASTY, KNEE, TOTAL
Anesthesia: Monitor Anesthesia Care | Site: Knee | Laterality: Right

## 2016-05-13 MED ORDER — ONDANSETRON HCL 4 MG/2ML IJ SOLN: 4.0000 mg | Freq: Four times a day (QID) | INTRAMUSCULAR | Status: DC | PRN

## 2016-05-13 MED ORDER — ACETAMINOPHEN 500 MG PO TABS
1000.0000 mg | ORAL_TABLET | Freq: Four times a day (QID) | ORAL | Status: AC
  Administered 2016-05-13 – 2016-05-14 (×4): 1000 mg via ORAL
  Filled 2016-05-13 (×4): qty 2

## 2016-05-13 MED ORDER — DIPHENHYDRAMINE HCL 12.5 MG/5ML PO ELIX
12.5000 mg | ORAL_SOLUTION | ORAL | Status: DC | PRN
Start: 2016-05-13 — End: 2016-05-15

## 2016-05-13 MED ORDER — VALACYCLOVIR HCL 500 MG PO TABS
500.0000 mg | ORAL_TABLET | Freq: Every day | ORAL | Status: DC
  Administered 2016-05-14 – 2016-05-15 (×2): 500 mg via ORAL
  Filled 2016-05-13 (×2): qty 1

## 2016-05-13 MED ORDER — PROPOFOL 10 MG/ML IV BOLUS
INTRAVENOUS | Status: AC
  Filled 2016-05-13: qty 40

## 2016-05-13 MED ORDER — SODIUM CHLORIDE 0.9 % IJ SOLN
INTRAMUSCULAR | Status: AC
  Filled 2016-05-13: qty 50

## 2016-05-13 MED ORDER — VANCOMYCIN HCL IN DEXTROSE 1-5 GM/200ML-% IV SOLN
1000.0000 mg | INTRAVENOUS | Status: AC
  Administered 2016-05-13: 1000 mg via INTRAVENOUS
  Filled 2016-05-13: qty 200

## 2016-05-13 MED ORDER — BUPIVACAINE IN DEXTROSE 0.75-8.25 % IT SOLN
INTRATHECAL | Status: DC | PRN
  Administered 2016-05-13: 1.8 mL via INTRATHECAL

## 2016-05-13 MED ORDER — FENTANYL CITRATE (PF) 100 MCG/2ML IJ SOLN
INTRAMUSCULAR | Status: DC | PRN
  Administered 2016-05-13: 50 ug via INTRAVENOUS

## 2016-05-13 MED ORDER — BUPIVACAINE LIPOSOME 1.3 % IJ SUSP
INTRAMUSCULAR | Status: DC | PRN
  Administered 2016-05-13: 20 mL

## 2016-05-13 MED ORDER — CHLORHEXIDINE GLUCONATE 4 % EX LIQD: 60.0000 mL | Freq: Once | CUTANEOUS | Status: DC

## 2016-05-13 MED ORDER — METOCLOPRAMIDE HCL 5 MG/ML IJ SOLN
5.0000 mg | Freq: Three times a day (TID) | INTRAMUSCULAR | Status: DC | PRN
Start: 2016-05-13 — End: 2016-05-15

## 2016-05-13 MED ORDER — OXYCODONE HCL 5 MG PO TABS
5.0000 mg | ORAL_TABLET | ORAL | Status: DC | PRN
  Administered 2016-05-13 – 2016-05-15 (×13): 10 mg via ORAL
  Filled 2016-05-13 (×13): qty 2

## 2016-05-13 MED ORDER — MIDAZOLAM HCL 2 MG/2ML IJ SOLN
INTRAMUSCULAR | Status: AC
  Filled 2016-05-13: qty 2

## 2016-05-13 MED ORDER — 0.9 % SODIUM CHLORIDE (POUR BTL) OPTIME
TOPICAL | Status: DC | PRN
  Administered 2016-05-13: 1000 mL

## 2016-05-13 MED ORDER — BUPIVACAINE HCL 0.25 % IJ SOLN
INTRAMUSCULAR | Status: DC | PRN
  Administered 2016-05-13: 30 mL

## 2016-05-13 MED ORDER — METOCLOPRAMIDE HCL 5 MG PO TABS
5.0000 mg | ORAL_TABLET | Freq: Three times a day (TID) | ORAL | Status: DC | PRN
Start: 2016-05-13 — End: 2016-05-15

## 2016-05-13 MED ORDER — METHOCARBAMOL 1000 MG/10ML IJ SOLN
500.0000 mg | Freq: Four times a day (QID) | INTRAVENOUS | Status: DC | PRN
  Filled 2016-05-13: qty 5

## 2016-05-13 MED ORDER — BUPIVACAINE LIPOSOME 1.3 % IJ SUSP
20.0000 mL | Freq: Once | INTRAMUSCULAR | Status: DC
  Filled 2016-05-13: qty 20

## 2016-05-13 MED ORDER — METHOCARBAMOL 500 MG PO TABS
500.0000 mg | ORAL_TABLET | Freq: Four times a day (QID) | ORAL | Status: DC | PRN
  Administered 2016-05-13 – 2016-05-15 (×7): 500 mg via ORAL
  Filled 2016-05-13 (×7): qty 1

## 2016-05-13 MED ORDER — DEXAMETHASONE SODIUM PHOSPHATE 10 MG/ML IJ SOLN
10.0000 mg | Freq: Once | INTRAMUSCULAR | Status: AC
  Administered 2016-05-13: 10 mg via INTRAVENOUS

## 2016-05-13 MED ORDER — STERILE WATER FOR IRRIGATION IR SOLN
Status: DC | PRN
  Administered 2016-05-13: 2000 mL

## 2016-05-13 MED ORDER — DEXAMETHASONE SODIUM PHOSPHATE 10 MG/ML IJ SOLN
INTRAMUSCULAR | Status: AC
  Filled 2016-05-13: qty 1

## 2016-05-13 MED ORDER — ACETAMINOPHEN 10 MG/ML IV SOLN
1000.0000 mg | Freq: Once | INTRAVENOUS | Status: AC
  Administered 2016-05-13: 1000 mg via INTRAVENOUS

## 2016-05-13 MED ORDER — PHENOL 1.4 % MT LIQD: 1.0000 | OROMUCOSAL | Status: DC | PRN

## 2016-05-13 MED ORDER — ACETAMINOPHEN 325 MG PO TABS: 650.0000 mg | ORAL_TABLET | Freq: Four times a day (QID) | ORAL | Status: DC | PRN

## 2016-05-13 MED ORDER — FLEET ENEMA 7-19 GM/118ML RE ENEM: 1.0000 | ENEMA | Freq: Once | RECTAL | Status: DC | PRN

## 2016-05-13 MED ORDER — POLYETHYLENE GLYCOL 3350 17 G PO PACK: 17.0000 g | PACK | Freq: Every day | ORAL | Status: DC | PRN

## 2016-05-13 MED ORDER — ACETAMINOPHEN 10 MG/ML IV SOLN
INTRAVENOUS | Status: AC
  Filled 2016-05-13: qty 100

## 2016-05-13 MED ORDER — DEXAMETHASONE SODIUM PHOSPHATE 10 MG/ML IJ SOLN
10.0000 mg | Freq: Once | INTRAMUSCULAR | Status: AC
  Administered 2016-05-14: 10 mg via INTRAVENOUS
  Filled 2016-05-13: qty 1

## 2016-05-13 MED ORDER — MORPHINE SULFATE (PF) 2 MG/ML IV SOLN
1.0000 mg | INTRAVENOUS | Status: DC | PRN
  Administered 2016-05-13 – 2016-05-14 (×6): 1 mg via INTRAVENOUS
  Filled 2016-05-13 (×6): qty 1

## 2016-05-13 MED ORDER — PROPOFOL 500 MG/50ML IV EMUL
INTRAVENOUS | Status: DC | PRN
  Administered 2016-05-13: 35 ug/kg/min via INTRAVENOUS

## 2016-05-13 MED ORDER — FENTANYL CITRATE (PF) 100 MCG/2ML IJ SOLN
INTRAMUSCULAR | Status: AC
  Filled 2016-05-13: qty 2

## 2016-05-13 MED ORDER — TRANEXAMIC ACID 1000 MG/10ML IV SOLN
1000.0000 mg | INTRAVENOUS | Status: AC
  Administered 2016-05-13: 1000 mg via INTRAVENOUS
  Filled 2016-05-13: qty 1100

## 2016-05-13 MED ORDER — BISACODYL 10 MG RE SUPP: 10.0000 mg | Freq: Every day | RECTAL | Status: DC | PRN

## 2016-05-13 MED ORDER — PROPOFOL 10 MG/ML IV BOLUS
INTRAVENOUS | Status: AC
  Filled 2016-05-13: qty 20

## 2016-05-13 MED ORDER — LACTATED RINGERS IV SOLN
INTRAVENOUS | Status: DC
  Administered 2016-05-13: 09:00:00 via INTRAVENOUS

## 2016-05-13 MED ORDER — MIDAZOLAM HCL 5 MG/5ML IJ SOLN
INTRAMUSCULAR | Status: DC | PRN
  Administered 2016-05-13: 2 mg via INTRAVENOUS

## 2016-05-13 MED ORDER — ONDANSETRON HCL 4 MG PO TABS: 4.0000 mg | ORAL_TABLET | Freq: Four times a day (QID) | ORAL | Status: DC | PRN

## 2016-05-13 MED ORDER — MENTHOL 3 MG MT LOZG: 1.0000 | LOZENGE | OROMUCOSAL | Status: DC | PRN

## 2016-05-13 MED ORDER — SODIUM CHLORIDE 0.9 % IV SOLN
INTRAVENOUS | Status: DC
  Administered 2016-05-13 – 2016-05-14 (×2): via INTRAVENOUS

## 2016-05-13 MED ORDER — TRANEXAMIC ACID 1000 MG/10ML IV SOLN
1000.0000 mg | Freq: Once | INTRAVENOUS | Status: AC
  Administered 2016-05-13: 1000 mg via INTRAVENOUS
  Filled 2016-05-13: qty 10

## 2016-05-13 MED ORDER — SODIUM CHLORIDE 0.9 % IR SOLN
Status: DC | PRN
  Administered 2016-05-13: 1000 mL

## 2016-05-13 MED ORDER — CITALOPRAM HYDROBROMIDE 20 MG PO TABS
20.0000 mg | ORAL_TABLET | Freq: Every day | ORAL | Status: DC
  Administered 2016-05-14 – 2016-05-15 (×2): 20 mg via ORAL
  Filled 2016-05-13 (×2): qty 1

## 2016-05-13 MED ORDER — LIDOCAINE 2% (20 MG/ML) 5 ML SYRINGE
INTRAMUSCULAR | Status: DC | PRN
  Administered 2016-05-13: 100 mg via INTRAVENOUS

## 2016-05-13 MED ORDER — ACETAMINOPHEN 650 MG RE SUPP: 650.0000 mg | Freq: Four times a day (QID) | RECTAL | Status: DC | PRN

## 2016-05-13 MED ORDER — HYDROMORPHONE HCL 1 MG/ML IJ SOLN: 0.2500 mg | INTRAMUSCULAR | Status: DC | PRN

## 2016-05-13 MED ORDER — RIVAROXABAN 10 MG PO TABS
10.0000 mg | ORAL_TABLET | Freq: Every day | ORAL | Status: DC
  Administered 2016-05-14 – 2016-05-15 (×2): 10 mg via ORAL
  Filled 2016-05-13 (×2): qty 1

## 2016-05-13 MED ORDER — SODIUM CHLORIDE 0.9 % IJ SOLN
INTRAMUSCULAR | Status: DC | PRN
  Administered 2016-05-13: 30 mL

## 2016-05-13 MED ORDER — BUPIVACAINE HCL (PF) 0.25 % IJ SOLN
INTRAMUSCULAR | Status: AC
  Filled 2016-05-13: qty 30

## 2016-05-13 MED ORDER — DOCUSATE SODIUM 100 MG PO CAPS
100.0000 mg | ORAL_CAPSULE | Freq: Two times a day (BID) | ORAL | Status: DC
  Administered 2016-05-13 – 2016-05-15 (×4): 100 mg via ORAL
  Filled 2016-05-13 (×4): qty 1

## 2016-05-13 MED ORDER — ONDANSETRON HCL 4 MG/2ML IJ SOLN
INTRAMUSCULAR | Status: DC | PRN
  Administered 2016-05-13: 4 mg via INTRAVENOUS

## 2016-05-13 MED ORDER — LIDOCAINE 2% (20 MG/ML) 5 ML SYRINGE
INTRAMUSCULAR | Status: AC
  Filled 2016-05-13: qty 5

## 2016-05-13 MED ORDER — VANCOMYCIN HCL IN DEXTROSE 1-5 GM/200ML-% IV SOLN
1000.0000 mg | Freq: Two times a day (BID) | INTRAVENOUS | Status: AC
  Administered 2016-05-13: 1000 mg via INTRAVENOUS
  Filled 2016-05-13: qty 200

## 2016-05-13 SURGICAL SUPPLY — 49 items
BAG DECANTER FOR FLEXI CONT (MISCELLANEOUS) ×2 IMPLANT
BAG ZIPLOCK 12X15 (MISCELLANEOUS) ×2 IMPLANT
BANDAGE ACE 6X5 VEL STRL LF (GAUZE/BANDAGES/DRESSINGS) ×2 IMPLANT
BLADE SAG 18X100X1.27 (BLADE) ×2 IMPLANT
BLADE SAW SGTL 11.0X1.19X90.0M (BLADE) ×2 IMPLANT
BOWL SMART MIX CTS (DISPOSABLE) ×2 IMPLANT
CAPT KNEE TOTAL 3 ATTUNE ×2 IMPLANT
CEMENT HV SMART SET (Cement) ×4 IMPLANT
CLOTH BEACON ORANGE TIMEOUT ST (SAFETY) ×2 IMPLANT
CUFF TOURN SGL QUICK 34 (TOURNIQUET CUFF) ×1
CUFF TRNQT CYL 34X4X40X1 (TOURNIQUET CUFF) ×1 IMPLANT
DECANTER SPIKE VIAL GLASS SM (MISCELLANEOUS) ×2 IMPLANT
DRAPE U-SHAPE 47X51 STRL (DRAPES) ×2 IMPLANT
DRSG ADAPTIC 3X8 NADH LF (GAUZE/BANDAGES/DRESSINGS) ×2 IMPLANT
DRSG PAD ABDOMINAL 8X10 ST (GAUZE/BANDAGES/DRESSINGS) ×2 IMPLANT
DURAPREP 26ML APPLICATOR (WOUND CARE) ×2 IMPLANT
ELECT REM PT RETURN 9FT ADLT (ELECTROSURGICAL) ×2
ELECTRODE REM PT RTRN 9FT ADLT (ELECTROSURGICAL) ×1 IMPLANT
EVACUATOR 1/8 PVC DRAIN (DRAIN) ×2 IMPLANT
GAUZE SPONGE 4X4 12PLY STRL (GAUZE/BANDAGES/DRESSINGS) ×2 IMPLANT
GLOVE BIO SURGEON STRL SZ7.5 (GLOVE) IMPLANT
GLOVE BIO SURGEON STRL SZ8 (GLOVE) ×2 IMPLANT
GLOVE BIOGEL PI IND STRL 6.5 (GLOVE) IMPLANT
GLOVE BIOGEL PI IND STRL 8 (GLOVE) ×1 IMPLANT
GLOVE BIOGEL PI INDICATOR 6.5 (GLOVE)
GLOVE BIOGEL PI INDICATOR 8 (GLOVE) ×1
GLOVE SURG SS PI 6.5 STRL IVOR (GLOVE) IMPLANT
GOWN STRL REUS W/TWL LRG LVL3 (GOWN DISPOSABLE) ×2 IMPLANT
GOWN STRL REUS W/TWL XL LVL3 (GOWN DISPOSABLE) IMPLANT
HANDPIECE INTERPULSE COAX TIP (DISPOSABLE) ×1
IMMOBILIZER KNEE 20 (SOFTGOODS) ×2
IMMOBILIZER KNEE 20 THIGH 36 (SOFTGOODS) ×1 IMPLANT
MANIFOLD NEPTUNE II (INSTRUMENTS) ×2 IMPLANT
NS IRRIG 1000ML POUR BTL (IV SOLUTION) ×2 IMPLANT
PACK TOTAL KNEE CUSTOM (KITS) ×2 IMPLANT
PADDING CAST COTTON 6X4 STRL (CAST SUPPLIES) ×2 IMPLANT
POSITIONER SURGICAL ARM (MISCELLANEOUS) ×2 IMPLANT
SET HNDPC FAN SPRY TIP SCT (DISPOSABLE) ×1 IMPLANT
STRIP CLOSURE SKIN 1/2X4 (GAUZE/BANDAGES/DRESSINGS) ×2 IMPLANT
SUT MNCRL AB 4-0 PS2 18 (SUTURE) ×2 IMPLANT
SUT VIC AB 2-0 CT1 27 (SUTURE) ×3
SUT VIC AB 2-0 CT1 TAPERPNT 27 (SUTURE) ×3 IMPLANT
SUT VLOC 180 0 24IN GS25 (SUTURE) ×2 IMPLANT
SYR 50ML LL SCALE MARK (SYRINGE) ×2 IMPLANT
TRAY FOLEY W/METER SILVER 14FR (SET/KITS/TRAYS/PACK) ×2 IMPLANT
TRAY FOLEY W/METER SILVER 16FR (SET/KITS/TRAYS/PACK) IMPLANT
WATER STERILE IRR 1500ML POUR (IV SOLUTION) ×4 IMPLANT
WRAP KNEE MAXI GEL POST OP (GAUZE/BANDAGES/DRESSINGS) ×2 IMPLANT
YANKAUER SUCT BULB TIP 10FT TU (MISCELLANEOUS) ×2 IMPLANT

## 2016-05-13 NOTE — Evaluation (Signed)
Physical Therapy Evaluation Patient Details Name: Maria Munoz MRN: [id number] DOB: 1966/03/23 Today's Date: 05/13/2016   History of Present Illness  R TKA  Clinical Impression  The patient ambulated x 30' today. Pain is 3/20 with medication. Plans to Dc home with HHPT. Pt admitted with above diagnosis. Pt currently with functional limitations due to the deficits listed below (see PT Problem List). Pt will benefit from skilled PT to increase their independence and safety with mobility to allow discharge to the venue listed below.       Follow Up Recommendations Home health PT;Supervision/Assistance - 24 hour    Equipment Recommendations  None recommended by PT    Recommendations for Other Services       Precautions / Restrictions Precautions Precautions: Knee Required Braces or Orthoses: Knee Immobilizer - Right Knee Immobilizer - Right: Discontinue once straight leg raise with < 10 degree lag      Mobility  Bed Mobility Overal bed mobility: Needs Assistance Bed Mobility: Supine to Sit     Supine to sit: Min guard        Transfers Overall transfer level: Needs assistance Equipment used: Rolling walker (2 wheeled) Transfers: Sit to/from Stand Sit to Stand: Min assist         General transfer comment: cues for hand placement and R leg  Ambulation/Gait Ambulation/Gait assistance: Min assist Ambulation Distance (Feet): 30 Feet Assistive device: Rolling walker (2 wheeled) Gait Pattern/deviations: Step-to pattern;Decreased step length - right;Decreased stance time - right     General Gait Details: cues for sequence  Stairs            Wheelchair Mobility    Modified Rankin (Stroke Patients Only)       Balance                                             Pertinent Vitals/Pain Pain Assessment: 0-10 Pain Score: 3  Pain Location: rt knee Pain Descriptors / Indicators: Discomfort;Tightness Pain Intervention(s): Monitored during  session;Premedicated before session;Repositioned;Ice applied    Home Living Family/patient expects to be discharged to:: Private residence Living Arrangements: Spouse/significant other Available Help at Discharge: Family Type of Home: House Home Access: Stairs to enter Entrance Stairs-Rails: None Technical brewer of Steps: 2 Home Layout: One level Home Equipment: Environmental consultant - 2 wheels      Prior Function                 Hand Dominance        Extremity/Trunk Assessment   Upper Extremity Assessment: Defer to OT evaluation           Lower Extremity Assessment: RLE deficits/detail RLE Deficits / Details: + SLR       Communication      Cognition Arousal/Alertness: Awake/alert Behavior During Therapy: WFL for tasks assessed/performed Overall Cognitive Status: Within Functional Limits for tasks assessed                      General Comments      Exercises        Assessment/Plan    PT Assessment Patient needs continued PT services  PT Diagnosis Difficulty walking;Acute pain   PT Problem List Decreased strength;Decreased range of motion;Decreased activity tolerance;Decreased safety awareness;Decreased knowledge of use of DME;Decreased knowledge of precautions;Pain  PT Treatment Interventions DME instruction;Gait training;Stair training;Functional mobility training;Therapeutic activities;Therapeutic exercise;Patient/family education  PT Goals (Current goals can be found in the Care Plan section) Acute Rehab PT Goals Patient Stated Goal: to go home PT Goal Formulation: With patient/family Time For Goal Achievement: 05/17/16 Potential to Achieve Goals: Good    Frequency 7X/week   Barriers to discharge        Co-evaluation               End of Session Equipment Utilized During Treatment: Right knee immobilizer Activity Tolerance: Patient tolerated treatment well Patient left: in chair;with call bell/phone within reach;with  family/visitor present Nurse Communication: Mobility status         Time: [id number] PT Time Calculation (min) (ACUTE ONLY): 20 min   Charges:   PT Evaluation $PT Eval Low Complexity: 1 Procedure     PT G CodesClaretha Cooper 05/13/2016, 6:58 PM

## 2016-05-13 NOTE — Anesthesia Postprocedure Evaluation (Signed)
Anesthesia Post Note  Patient: IMANNI MORANVILLE  Procedure(s) Performed: Procedure(s) (LRB): RIGHT TOTAL KNEE ARTHROPLASTY (Right)  Patient location during evaluation: PACU Anesthesia Type: Spinal and MAC Level of consciousness: awake and alert Pain management: pain level controlled Vital Signs Assessment: post-procedure vital signs reviewed and stable Respiratory status: spontaneous breathing and respiratory function stable Cardiovascular status: blood pressure returned to baseline and stable Postop Assessment: spinal receding Anesthetic complications: no    Last Vitals:  Vitals:   05/13/16 1300 05/13/16 1308  BP: 102/80   Pulse: (!) 57 (!) 57  Resp: 16 12  Temp:  36.4 C    Last Pain:  Vitals:   05/13/16 1315  TempSrc:   PainSc: 0-No pain    LLE Motor Response: Purposeful movement (05/13/16 1315) LLE Sensation: Decreased (05/13/16 1315) RLE Motor Response: Purposeful movement (05/13/16 1315) RLE Sensation: Decreased (05/13/16 1315) L Sensory Level: L3-Anterior knee, lower leg (05/13/16 1315) R Sensory Level: L3-Anterior knee, lower leg (05/13/16 1315)  Wayne Brunker,W. EDMOND

## 2016-05-13 NOTE — Anesthesia Procedure Notes (Signed)
Spinal  Start time: 05/13/2016 11:16 AM End time: 05/13/2016 11:21 AM Staffing Resident/CRNA: Carleene Cooper A Preanesthetic Checklist Completed: patient identified, site marked, surgical consent, pre-op evaluation, timeout performed, IV checked, risks and benefits discussed and monitors and equipment checked Spinal Block Patient position: sitting Prep: Betadine Patient monitoring: heart rate, blood pressure and continuous pulse ox Approach: midline Location: L3-4 Injection technique: single-shot Needle Needle type: Spinocan and Sprotte  Needle gauge: 24 G Needle length: 10 cm Needle insertion depth: 6 cm Assessment Sensory level: T4 Additional Notes Pt placed in sitting position. Tolerated procedure well. Spinal kit expiration date checked and verified. + CSF, - heme. No paresthesia.

## 2016-05-13 NOTE — Transfer of Care (Signed)
Immediate Anesthesia Transfer of Care Note  Patient: Maria Munoz  Procedure(s) Performed: Procedure(s): RIGHT TOTAL KNEE ARTHROPLASTY (Right)  Patient Location: PACU  Anesthesia Type:Spinal  Level of Consciousness: awake, alert , oriented and patient cooperative  Airway & Oxygen Therapy: Patient Spontanous Breathing and Patient connected to face mask oxygen  Post-op Assessment: Report given to RN and Post -op Vital signs reviewed and stable  Post vital signs: Reviewed and stable  Last Vitals:  Vitals:   05/13/16 0816  BP: 113/72  Pulse: 67  Resp: 16    Last Pain:  Vitals:   05/13/16 1022  TempSrc:   PainSc: 0-No pain      Patients Stated Pain Goal: 4 ([id number])  Complications: No apparent anesthesia complications

## 2016-05-13 NOTE — H&P (View-Only) (Signed)
Maria Munoz DOB: Aug 10, 1966 Divorced / Language: Maria Munoz / Race: White Female Date of Admission:  05/13/2016 CC:  Right knee pain History of Present Illness  The patient is a 50 year old female who comes in for a preoperative History and Physical. The patient is scheduled for a right total knee arthroplasty to be performed by Dr. Dione Plover. Aluisio, MD at [facility] on 05/13/2016. The patient is a 50 year old female who is being followed for their right knee pain and osteoarthritis. They are now out from post cortisone injection. Symptoms reported include: pain, aching, popping, grinding, giving way and pain with weightbearing. The patient feels that they are doing poorly and report their pain level to be 4 / 10. Current treatment includes: NSAIDs. The following medication has been used for pain control: Aleve (w/ Mobic). The patient has reported improvement of their symptoms with: Cortisone injections (helped for 2 weeks). The patient indicates that they are ready to discuss surgery. Note for "Follow-up Knee": Patient states that she did not find much relief in the injections. She only got about two weeks or reflief with the cortisone shot. She states that she has tried the gel before and no benefit either. At this point she is ready to proceed with surgery at this time.  We have gone over the procedure, the hospital stay, the importance of therapy and she wishes to proceed with surgery. She has helped her mother, [name], go through both knees in the past so she already has an idea about the postoperative course. She feels as though the right knee is getting worse. The injections did not help. She is ready to proceed with surgery. They have been treated conservatively in the past for the above stated problem and despite conservative measures, they continue to have progressive pain and severe functional limitations and dysfunction. They have failed non-operative management including home  exercise, medications, and injections. It is felt that they would benefit from undergoing total joint replacement. Risks and benefits of the procedure have been discussed with the patient and they elect to proceed with surgery. There are no active contraindications to surgery such as ongoing infection or rapidly progressive neurological disease.  Problem List/Past Medical  Primary osteoarthritis of left knee (M17.12)  Right knee pain (M25.561)  Anxiety Disorder  Allergies  Ampicillin *PENICILLINS*  Hives. as a child  Family History Diabetes Mellitus  Mother. mother Cancer  Father. father Osteoporosis  mother Cerebrovascular Accident  Father. father Osteoarthritis  Mother. mother Hypertension  Father. mother and father  Social History  Drug/Alcohol Rehab (Previously)  no Drug/Alcohol Rehab (Currently)  no Not under pain contract  Exercise  Exercises daily; does running / walking and gym / weights Exercises weekly; does other Tobacco use  Former smoker. former smoker; smoke(d) less than 1/2 pack(s) per day Tobacco / smoke exposure  no Number of flights of stairs before winded  4-5 Pain Contract  no Marital status  divorced Living situation  live alone Illicit drug use  no No history of drug/alcohol rehab  Current work status  working full time; Engineer, [employer] Current drinker  05/03/2014: Currently drinks beer only occasionally per week Children  2 Alcohol use  current drinker; drinks beer; only occasionally per week current drinker; drinks beer and wine; only occasionally per week  Medication History Mobic (15MG  Tablet, 1 (one) Oral 1 PO QD WITH FOOD, Taken starting 10/03/2015) Active. (PAB/JAF RX SENT VIA ESCRIBE) ValACYclovir HCl (500MG  Tablet, Oral) Active.  Citalopram Hydrobromide (20MG  Tablet, Oral) Active. Phentermine HCl (Oral) Specific strength unknown - Active. Biotin (Oral) Specific strength unknown -  Active. Multiple Vitamin (1 Oral) Active. Aleve Active.   Past Surgical History Arthroscopy of Knee  left Tubal Ligation  Tonsillectomy  Left Knee ACL Reconstruction   Review of Systems  General Not Present- Chills, Fatigue, Fever, Memory Loss, Night Sweats, Weight Gain and Weight Loss. Skin Not Present- Eczema, Hives, Itching, Lesions and Rash. HEENT Not Present- Dentures, Double Vision, Headache, Hearing Loss, Tinnitus and Visual Loss. Respiratory Not Present- Allergies, Chronic Cough, Coughing up blood, Shortness of breath at rest and Shortness of breath with exertion. Cardiovascular Not Present- Chest Pain, Difficulty Breathing Lying Down, Murmur, Palpitations, Racing/skipping heartbeats and Swelling. Gastrointestinal Not Present- Abdominal Pain, Bloody Stool, Constipation, Diarrhea, Difficulty Swallowing, Heartburn, Jaundice, Loss of appetitie, Nausea and Vomiting. Female Genitourinary Not Present- Blood in Urine, Discharge, Flank Pain, Incontinence, Painful Urination, Urgency, Urinary frequency, Urinary Retention, Urinating at Night and Weak urinary stream. Musculoskeletal Present- Joint Pain. Not Present- Back Pain, Joint Swelling, Morning Stiffness, Muscle Pain, Muscle Weakness and Spasms. Neurological Not Present- Blackout spells, Difficulty with balance, Dizziness, Paralysis, Tremor and Weakness. Psychiatric Not Present- Insomnia.  Vitals  Weight: 170 lb Height: 66in Weight was reported by patient. Height was reported by patient. Body Surface Area: 1.87 m Body Mass Index: 27.44 kg/m  Pulse: 68 (Regular)  BP: 108/66 (Sitting, Right Arm, Standard)  Physical Exam General Mental Status -Alert, cooperative and good historian. General Appearance-pleasant, Not in acute distress. Orientation-Oriented X3. Build & Nutrition-Well nourished and Well developed.  Head and Neck Head-normocephalic, atraumatic . Neck Global Assessment - supple, no bruit  auscultated on the right, no bruit auscultated on the left.  Eye Vision-Wears contact lenses. Pupil - Bilateral-Regular and Round. Motion - Bilateral-EOMI.  Chest and Lung Exam Auscultation Breath sounds - clear at anterior chest wall and clear at posterior chest wall. Adventitious sounds - No Adventitious sounds.  Cardiovascular Auscultation Rhythm - Regular rate and rhythm. Heart Sounds - S1 WNL and S2 WNL. Murmurs & Other Heart Sounds - Auscultation of the heart reveals - No Murmurs.  Abdomen Palpation/Percussion Tenderness - Abdomen is non-tender to palpation. Rigidity (guarding) - Abdomen is soft. Auscultation Auscultation of the abdomen reveals - Bowel sounds normal.  Female Genitourinary Note: Not done, not pertinent to present illness   Musculoskeletal Note: On exam, she is alert and oriented, in no apparent distress. Her right knee shows no effusion. She has got a valgus deformity. Range of motion about 5 to 125 with tenderness lateral greater than medial with no instability.  Assessment & Plan Primary osteoarthritis of right knee (M17.11)  Note:Surgical Plans: Right Total Knee Replacement  Disposition: Home  PCP: Dr. Einar Pheasant  IV TXA  Anesthesia Issues: None  VERITAS STUDY PATIENT Virtual Therapy  Signed electronically by Ok Edwards, III PA-C

## 2016-05-13 NOTE — Anesthesia Preprocedure Evaluation (Addendum)
Anesthesia Evaluation  Patient identified by MRN, date of birth, ID band Patient awake    Reviewed: Allergy & Precautions, H&P , NPO status , Patient's Chart, lab work & pertinent test results  Airway Mallampati: II  TM Distance: >3 FB Neck ROM: Full    Dental no notable dental hx. (+) Teeth Intact, Dental Advisory Given   Pulmonary Current Smoker,    Pulmonary exam normal breath sounds clear to auscultation       Cardiovascular negative cardio ROS   Rhythm:Regular Rate:Normal     Neuro/Psych negative neurological ROS  negative psych ROS   GI/Hepatic negative GI ROS, Neg liver ROS,   Endo/Other  negative endocrine ROS  Renal/GU negative Renal ROS  negative genitourinary   Musculoskeletal  (+) Arthritis , Osteoarthritis,    Abdominal   Peds  Hematology negative hematology ROS (+)   Anesthesia Other Findings   Reproductive/Obstetrics negative OB ROS                            Anesthesia Physical Anesthesia Plan  ASA: II  Anesthesia Plan: MAC and Spinal   Post-op Pain Management:    Induction: Intravenous  Airway Management Planned: Simple Face Mask  Additional Equipment:   Intra-op Plan:   Post-operative Plan:   Informed Consent: I have reviewed the patients History and Physical, chart, labs and discussed the procedure including the risks, benefits and alternatives for the proposed anesthesia with the patient or authorized representative who has indicated his/her understanding and acceptance.   Dental advisory given  Plan Discussed with: CRNA  Anesthesia Plan Comments:         Anesthesia Quick Evaluation

## 2016-05-13 NOTE — Interval H&P Note (Signed)
History and Physical Interval Note:  05/13/2016 10:22 AM  Maria Munoz  has presented today for surgery, with the diagnosis of right knee osteoarthritis  The various methods of treatment have been discussed with the patient and family. After consideration of risks, benefits and other options for treatment, the patient has consented to  Procedure(s): RIGHT TOTAL KNEE ARTHROPLASTY (Right) as a surgical intervention .  The patient's history has been reviewed, patient examined, no change in status, stable for surgery.  I have reviewed the patient's chart and labs.  Questions were answered to the patient's satisfaction.     [name]

## 2016-05-13 NOTE — Op Note (Signed)
OPERATIVE REPORT-TOTAL KNEE ARTHROPLASTY   Pre-operative diagnosis- Osteoarthritis  Right knee(s)  Post-operative diagnosis- Osteoarthritis Right knee(s)  Procedure-  Right  Total Knee Arthroplasty  Surgeon- Dione Plover. Lucrecia Mcphearson, MD  Assistant- Amber constable, PA-C   Anesthesia-  Spinal  EBL-* No blood loss amount entered *   Drains Hemovac  Tourniquet time-  Total Tourniquet Time Documented: Thigh (Right) - 29 minutes Total: Thigh (Right) - 29 minutes     Complications- None  Condition-PACU - hemodynamically stable.   Brief Clinical Note  Maria Munoz is a 50 y.o. year old female with end stage OA of her right knee with progressively worsening pain and dysfunction. She has constant pain, with activity and at rest and significant functional deficits with difficulties even with ADLs. She has had extensive non-op management including analgesics, injections of cortisone and viscosupplements, and home exercise program, but remains in significant pain with significant dysfunction.Radiographs show bone on bone arthritis lateral and patelloefmoral. She presents now for right Total Knee Arthroplasty.    Procedure in detail---   The patient is brought into the operating room and positioned supine on the operating table. After successful administration of  Spinal,   a tourniquet is placed high on the  Right thigh(s) and the lower extremity is prepped and draped in the usual sterile fashion. Time out is performed by the operating team and then the  Right lower extremity is wrapped in Esmarch, knee flexed and the tourniquet inflated to 300 mmHg.       A midline incision is made with a ten blade through the subcutaneous tissue to the level of the extensor mechanism. A fresh blade is used to make a medial parapatellar arthrotomy. Soft tissue over the proximal medial tibia is subperiosteally elevated to the joint line with a knife and into the semimembranosus bursa with a Cobb elevator. Soft  tissue over the proximal lateral tibia is elevated with attention being paid to avoiding the patellar tendon on the tibial tubercle. The patella is everted, knee flexed 90 degrees and the ACL and PCL are removed. Findings are bone on bone lateral and patellofemoral with large osteophytes.        The drill is used to create a starting hole in the distal femur and the canal is thoroughly irrigated with sterile saline to remove the fatty contents. The 5 degree Right  valgus alignment guide is placed into the femoral canal and the distal femoral cutting block is pinned to remove 10 mm off the distal femur. Resection is made with an oscillating saw.      The tibia is subluxed forward and the menisci are removed. The extramedullary alignment guide is placed referencing proximally at the medial aspect of the tibial tubercle and distally along the second metatarsal axis and tibial crest. The block is pinned to remove 44mm off the more deficient lateral  side. Resection is made with an oscillating saw. Size 5is the most appropriate size for the tibia and the proximal tibia is prepared with the modular drill and keel punch for that size.      The femoral sizing guide is placed and size 5 is most appropriate. Rotation is marked off the epicondylar axis and confirmed by creating a rectangular flexion gap at 90 degrees. The size 5 cutting block is pinned in this rotation and the anterior, posterior and chamfer cuts are made with the oscillating saw. The intercondylar block is then placed and that cut is made.      Trial  size 5 tibial component, trial size 5 narrow posterior stabilized femur and a 8  mm posterior stabilized rotating platform insert trial is placed. Full extension is achieved with excellent varus/valgus and anterior/posterior balance throughout full range of motion. The patella is everted and thickness measured to be 22  mm. Free hand resection is taken to 12 mm, a 35 template is placed, lug holes are drilled,  trial patella is placed, and it tracks normally. Osteophytes are removed off the posterior femur with the trial in place. All trials are removed and the cut bone surfaces prepared with pulsatile lavage. Cement is mixed and once ready for implantation, the size 5 tibial implant, size  5 narrow posterior stabilized femoral component, and the size 35 patella are cemented in place and the patella is held with the clamp. The trial insert is placed and the knee held in full extension. The Exparel (20 ml mixed with 30 ml saline) and .25% Bupivicaine, are injected into the extensor mechanism, posterior capsule, medial and lateral gutters and subcutaneous tissues.  All extruded cement is removed and once the cement is hard the permanent 8 mm posterior stabilized rotating platform insert is placed into the tibial tray.      The wound is copiously irrigated with saline solution and the extensor mechanism closed over a hemovac drain with #1 V-loc suture. The tourniquet is released for a total tourniquet time of 29  minutes. Flexion against gravity is 140 degrees and the patella tracks normally. Subcutaneous tissue is closed with 2.0 vicryl and subcuticular with running 4.0 Monocryl. The incision is cleaned and dried and steri-strips and a bulky sterile dressing are applied. The limb is placed into a knee immobilizer and the patient is awakened and transported to recovery in stable condition.      Please note that a surgical assistant was a medical necessity for this procedure in order to perform it in a safe and expeditious manner. Surgical assistant was necessary to retract the ligaments and vital neurovascular structures to prevent injury to them and also necessary for proper positioning of the limb to allow for anatomic placement of the prosthesis.   Dione Plover Lagena Strand, MD    05/13/2016, 12:19 PM

## 2016-05-14 LAB — CBC
HEMATOCRIT: 35.6 % — AB (ref 36.0–46.0)
HEMOGLOBIN: 11.9 g/dL — AB (ref 12.0–15.0)
MCH: 32.2 pg (ref 26.0–34.0)
MCHC: 33.4 g/dL (ref 30.0–36.0)
MCV: 96.2 fL (ref 78.0–100.0)
Platelets: 239 10*3/uL (ref 150–400)
RBC: 3.7 MIL/uL — AB (ref 3.87–5.11)
RDW: 13.4 % (ref 11.5–15.5)
WBC: 13.4 10*3/uL — AB (ref 4.0–10.5)

## 2016-05-14 LAB — BASIC METABOLIC PANEL
ANION GAP: 5 (ref 5–15)
BUN: <5 mg/dL — ABNORMAL LOW (ref 6–20)
CHLORIDE: 107 mmol/L (ref 101–111)
CO2: 28 mmol/L (ref 22–32)
Calcium: 8.4 mg/dL — ABNORMAL LOW (ref 8.9–10.3)
Creatinine, Ser: 0.45 mg/dL (ref 0.44–1.00)
GFR calc Af Amer: >60 mL/min (ref >60)
GFR calc non Af Amer: >60 mL/min (ref >60)
GLUCOSE: 108 mg/dL — AB (ref 65–99)
POTASSIUM: 3.4 mmol/L — AB (ref 3.5–5.1)
Sodium: 140 mmol/L (ref 135–145)

## 2016-05-14 MED ORDER — OXYCODONE HCL 5 MG PO TABS: 5.0000 mg | ORAL_TABLET | ORAL | 0 refills | Status: DC | PRN

## 2016-05-14 MED ORDER — RIVAROXABAN 10 MG PO TABS: 10.0000 mg | ORAL_TABLET | Freq: Every day | ORAL | 0 refills | Status: DC

## 2016-05-14 MED ORDER — METHOCARBAMOL 500 MG PO TABS: 500.0000 mg | ORAL_TABLET | Freq: Four times a day (QID) | ORAL | 0 refills | Status: DC | PRN

## 2016-05-14 NOTE — Progress Notes (Signed)
OT Cancellation Note  Patient Details Name: Maria Munoz MRN: [id number] DOB: [birth year]   Cancelled Treatment:    Reason Eval/Treat Not Completed: Other (comment). Pt just finished with PT and back to bed.  Will check back tomorrow.  Edman Lipsey 05/14/2016, 3:04 PM  Lesle Chris, OTR/L [phone] 05/14/2016

## 2016-05-14 NOTE — Progress Notes (Signed)
OT Cancellation Note  Patient Details Name: Maria Munoz MRN: [id number] DOB: 06-Apr-1966   Cancelled Treatment:    Reason Eval/Treat Not Completed: Pt refusing OT this am due to pain. Will check back for OT evaluation later today vs tomorrow.   Meziah Blasingame A 05/14/2016, 1:40 PM

## 2016-05-14 NOTE — Progress Notes (Signed)
Physical Therapy Treatment Patient Details Name: Maria Munoz MRN: [id number] DOB: Sep 28, 1965 Today's Date: 05/14/2016    History of Present Illness R TKA    PT Comments    The patient was in increased pain when attempted therapy at 12:30. Recently medicated and tolerated well. Plans DC tomorrow.  Follow Up Recommendations  Supervision/Assistance - 24 hour;Other (comment) (Virtual therapy)     Equipment Recommendations  None recommended by PT    Recommendations for Other Services       Precautions / Restrictions Precautions Precautions: Knee Required Braces or Orthoses: Knee Immobilizer - Right Knee Immobilizer - Right: Discontinue once straight leg raise with < 10 degree lag    Mobility  Bed Mobility Overal bed mobility: Needs Assistance Bed Mobility: Supine to Sit;Sit to Supine     Supine to sit: Supervision     General bed mobility comments: manages rt leg to bed edge, assist leg onto bed  Transfers Overall transfer level: Needs assistance Equipment used: Rolling walker (2 wheeled) Transfers: Sit to/from Stand Sit to Stand: Supervision         General transfer comment: cues for hand placement and R leg  Ambulation/Gait Ambulation/Gait assistance: Supervision Ambulation Distance (Feet): 60 Feet Assistive device: Rolling walker (2 wheeled) Gait Pattern/deviations: Step-to pattern;Antalgic;Decreased weight shift to right     General Gait Details: cues for sequence   Stairs            Wheelchair Mobility    Modified Rankin (Stroke Patients Only)       Balance                                    Cognition Arousal/Alertness: Awake/alert                          Exercises Total Joint Exercises Ankle Circles/Pumps: AROM;Both;10 reps;Supine Quad Sets: AROM;Both;10 reps;Supine Heel Slides: AAROM;Right;10 reps;Supine Hip ABduction/ADduction: AAROM;Right;10 reps;Supine Straight Leg Raises: AAROM;Right;10  reps;Supine    General Comments        Pertinent Vitals/Pain Pain Score: 3  Pain Location: rt knee Pain Descriptors / Indicators: Discomfort;Grimacing    Home Living                      Prior Function            PT Goals (current goals can now be found in the care plan section) Progress towards PT goals: Progressing toward goals    Frequency  7X/week    PT Plan Current plan remains appropriate    Co-evaluation             End of Session Equipment Utilized During Treatment: Right knee immobilizer Activity Tolerance: Patient tolerated treatment well Patient left: in bed;with call bell/phone within reach;with family/visitor present     Time: [id number] PT Time Calculation (min) (ACUTE ONLY): 28 min  Charges:                       G CodesVito Berger 05/14/2016, 4:02 PM  Tresa Endo PT [phone]

## 2016-05-14 NOTE — Progress Notes (Addendum)
   Subjective: 1 Day Post-Op Procedure(s) (LRB): RIGHT TOTAL KNEE ARTHROPLASTY (Right) Patient reports pain as mild.   Patient seen in rounds for Dr. Wynelle Link. Patient is well, but has had some minor complaints of pain in the knee, requiring pain medications We will resume therapy today.  She walked 30 feet yesterday. Plan is to go Home after hospital stay.  Objective: Vital signs in last 24 hours: Temp:  [97.5 F (36.4 C)-98.7 F (37.1 C)] 97.8 F (36.6 C) (09/12 0540) Pulse Rate:  [57-76] 76 (09/12 0540) Resp:  [12-20] 20 (09/12 0540) BP: (102-123)/(55-80) 103/65 (09/12 0540) SpO2:  [98 %-100 %] 99 % (09/12 0540) Weight:  [77.6 kg (171 lb)] 77.6 kg (171 lb) (09/11 0831)  Intake/Output from previous day:  Intake/Output Summary (Last 24 hours) at 05/14/16 0820 Last data filed at 05/14/16 0557  Gross per 24 hour  Intake             3510 ml  Output             3785 ml  Net             -275 ml    Intake/Output this shift: No intake/output data recorded.  Labs:  Recent Labs  05/14/16 0451  HGB 11.9*    Recent Labs  05/14/16 0451  WBC 13.4*  RBC 3.70*  HCT 35.6*  PLT 239    Recent Labs  05/14/16 0451  NA 140  K 3.4*  CL 107  CO2 28  BUN <5*  CREATININE 0.45  GLUCOSE 108*  CALCIUM 8.4*   No results for input(s): LABPT, INR in the last 72 hours.  EXAM General - Patient is Alert, Appropriate and Oriented Extremity - Neurovascular intact Sensation intact distally Dorsiflexion/Plantar flexion intact Dressing - dressing C/D/I Motor Function - intact, moving foot and toes well on exam.  Hemovac pulled without difficulty.  Past Medical History:  Diagnosis Date  . Arthritis   . Herpes    H/O  . History of abnormal Pap smear   . Hypercholesterolemia     Assessment/Plan: 1 Day Post-Op Procedure(s) (LRB): RIGHT TOTAL KNEE ARTHROPLASTY (Right) Principal Problem:   OA (osteoarthritis) of knee  Estimated body mass index is 27.6 kg/m as calculated  from the following:   Height as of this encounter: 5\' 6"  (1.676 m).   Weight as of this encounter: 77.6 kg (171 lb). Advance diet Up with therapy Plan for discharge tomorrow Discharge home - No HHPT - Virtual Therapy at Home  DVT Prophylaxis - Xarelto Weight-Bearing as tolerated to right leg D/C O2 and Pulse OX and try on Room Air  Arlee Muslim, PA-C Orthopaedic Surgery 05/14/2016, 8:20 AM

## 2016-05-14 NOTE — Discharge Instructions (Addendum)
° °Dr. Frank Aluisio °Total Joint Specialist °[facility] °[address] °[place], [place] [address] °[phone] ° °TOTAL KNEE REPLACEMENT POSTOPERATIVE DIRECTIONS ° °Knee Rehabilitation, Guidelines Following Surgery  °Results after knee surgery are often greatly improved when you follow the exercise, range of motion and muscle strengthening exercises prescribed by your doctor. Safety measures are also important to protect the knee from further injury. Any time any of these exercises cause you to have increased pain or swelling in your knee joint, decrease the amount until you are comfortable again and slowly increase them. If you have problems or questions, call your caregiver or physical therapist for advice.  ° °HOME CARE INSTRUCTIONS  °Remove items at home which could result in a fall. This includes throw rugs or furniture in walking pathways.  °· ICE to the affected knee every three hours for 30 minutes at a time and then as needed for pain and swelling.  Continue to use ice on the knee for pain and swelling from surgery. You may notice swelling that will progress down to the foot and ankle.  This is normal after surgery.  Elevate the leg when you are not up walking on it.   °· Continue to use the breathing machine which will help keep your temperature down.  It is common for your temperature to cycle up and down following surgery, especially at night when you are not up moving around and exerting yourself.  The breathing machine keeps your lungs expanded and your temperature down. °· Do not place pillow under knee, focus on keeping the knee straight while resting ° °DIET °You may resume your previous home diet once your are discharged from the hospital. ° °DRESSING / WOUND CARE / SHOWERING °You may shower 3 days after surgery, but keep the wounds dry during showering.  You may use an occlusive plastic wrap (Press'n Seal for example), NO SOAKING/SUBMERGING IN THE BATHTUB.  If the  bandage gets wet, change with a clean dry gauze.  If the incision gets wet, pat the wound dry with a clean towel. °You may start showering once you are discharged home but do not submerge the incision under water. Just pat the incision dry and apply a dry gauze dressing on daily. °Change the surgical dressing daily and reapply a dry dressing each time. ° °ACTIVITY °Walk with your walker as instructed. °Use walker as long as suggested by your caregivers. °Avoid periods of inactivity such as sitting longer than an hour when not asleep. This helps prevent blood clots.  °You may resume a sexual relationship in one month or when given the OK by your doctor.  °You may return to work once you are cleared by your doctor.  °Do not drive a car for 6 weeks or until released by you surgeon.  °Do not drive while taking narcotics. ° °WEIGHT BEARING °Weight bearing as tolerated with assist device (walker, cane, etc) as directed, use it as long as suggested by your surgeon or therapist, typically at least 4-6 weeks. ° °POSTOPERATIVE CONSTIPATION PROTOCOL °Constipation - defined medically as fewer than three stools per week and severe constipation as less than one stool per week. ° °One of the most common issues patients have following surgery is constipation.  Even if you have a regular bowel pattern at home, your normal regimen is likely to be disrupted due to multiple reasons following surgery.  Combination of anesthesia, postoperative narcotics, change in appetite and fluid intake all can affect your bowels.    In order to avoid complications following surgery, here are some recommendations in order to help you during your recovery period. ° °Colace (docusate) - Pick up an over-the-counter form of Colace or another stool softener and take twice a day as long as you are requiring postoperative pain medications.  Take with a full glass of water daily.  If you experience loose stools or diarrhea, hold the colace until you stool forms  back up.  If your symptoms do not get better within 1 week or if they get worse, check with your doctor. ° °Dulcolax (bisacodyl) - Pick up over-the-counter and take as directed by the product packaging as needed to assist with the movement of your bowels.  Take with a full glass of water.  Use this product as needed if not relieved by Colace only.  ° °MiraLax (polyethylene glycol) - Pick up over-the-counter to have on hand.  MiraLax is a solution that will increase the amount of water in your bowels to assist with bowel movements.  Take as directed and can mix with a glass of water, juice, soda, coffee, or tea.  Take if you go more than two days without a movement. °Do not use MiraLax more than once per day. Call your doctor if you are still constipated or irregular after using this medication for 7 days in a row. ° °If you continue to have problems with postoperative constipation, please contact the office for further assistance and recommendations.  If you experience "the worst abdominal pain ever" or develop nausea or vomiting, please contact the office immediatly for further recommendations for treatment. ° °ITCHING ° If you experience itching with your medications, try taking only a single pain pill, or even half a pain pill at a time.  You can also use Benadryl over the counter for itching or also to help with sleep.  ° °TED HOSE STOCKINGS °Wear the elastic stockings on both legs for three weeks following surgery during the day but you may remove then at night for sleeping. ° °MEDICATIONS °See your medication summary on the “After Visit Summary” that the nursing staff will review with you prior to discharge.  You may have some home medications which will be placed on hold until you complete the course of blood thinner medication.  It is important for you to complete the blood thinner medication as prescribed by your surgeon.  Continue your approved medications as instructed at time of  discharge. ° °PRECAUTIONS °If you experience chest pain or shortness of breath - call 911 immediately for transfer to the hospital emergency department.  °If you develop a fever greater that 101 F, purulent drainage from wound, increased redness or drainage from wound, foul odor from the wound/dressing, or calf pain - CONTACT YOUR SURGEON.   °                                                °FOLLOW-UP APPOINTMENTS °Make sure you keep all of your appointments after your operation with your surgeon and caregivers. You should call the office at the above phone number and make an appointment for approximately two weeks after the date of your surgery or on the date instructed by your surgeon outlined in the "After Visit Summary". ° ° °RANGE OF MOTION AND STRENGTHENING EXERCISES  °Rehabilitation of the knee is important following a knee injury or   an operation. After just a few days of immobilization, the muscles of the thigh which control the knee become weakened and shrink (atrophy). Knee exercises are designed to build up the tone and strength of the thigh muscles and to improve knee motion. Often times heat used for twenty to thirty minutes before working out will loosen up your tissues and help with improving the range of motion but do not use heat for the first two weeks following surgery. These exercises can be done on a training (exercise) mat, on the floor, on a table or on a bed. Use what ever works the best and is most comfortable for you Knee exercises include:  °Leg Lifts - While your knee is still immobilized in a splint or cast, you can do straight leg raises. Lift the leg to 60 degrees, hold for 3 sec, and slowly lower the leg. Repeat 10-20 times 2-3 times daily. Perform this exercise against resistance later as your knee gets better.  °Quad and Hamstring Sets - Tighten up the muscle on the front of the thigh (Quad) and hold for 5-10 sec. Repeat this 10-20 times hourly. Hamstring sets are done by pushing the  foot backward against an object and holding for 5-10 sec. Repeat as with quad sets.  °· Leg Slides: Lying on your back, slowly slide your foot toward your buttocks, bending your knee up off the floor (only go as far as is comfortable). Then slowly slide your foot back down until your leg is flat on the floor again. °· Angel Wings: Lying on your back spread your legs to the side as far apart as you can without causing discomfort.  °A rehabilitation program following serious knee injuries can speed recovery and prevent re-injury in the future due to weakened muscles. Contact your doctor or a physical therapist for more information on knee rehabilitation.  ° °IF YOU ARE TRANSFERRED TO A SKILLED REHAB FACILITY °If the patient is transferred to a skilled rehab facility following release from the hospital, a list of the current medications will be sent to the facility for the patient to continue.  When discharged from the skilled rehab facility, please have the facility set up the patient's Home Health Physical Therapy prior to being released. Also, the skilled facility will be responsible for providing the patient with their medications at time of release from the facility to include their pain medication, the muscle relaxants, and their blood thinner medication. If the patient is still at the rehab facility at time of the two week follow up appointment, the skilled rehab facility will also need to assist the patient in arranging follow up appointment in our office and any transportation needs. ° °MAKE SURE YOU:  °Understand these instructions.  °Get help right away if you are not doing well or get worse.  ° ° °Pick up stool softner and laxative for home use following surgery while on pain medications. °Do not submerge incision under water. °Please use good hand washing techniques while changing dressing each day. °May shower starting three days after surgery. °Please use a clean towel to pat the incision dry following  showers. °Continue to use ice for pain and swelling after surgery. °Do not use any lotions or creams on the incision until instructed by your surgeon. ° °Take Xarelto for two and a half more weeks, then discontinue Xarelto. °Once the patient has completed the blood thinner regimen, then take a Baby 81 mg Aspirin daily for three more weeks. ° ° °Information   on my medicine - XARELTO (Rivaroxaban)  This medication education was reviewed with me or my healthcare representative as part of my discharge preparation.  The pharmacist that spoke with me during my hospital stay was:  Altha Harm  Why was Xarelto prescribed for you? Xarelto was prescribed for you to reduce the risk of blood clots forming after orthopedic surgery. The medical term for these abnormal blood clots is venous thromboembolism (VTE).  What do you need to know about xarelto ? Take your Xarelto ONCE DAILY at the same time every day. You may take it either with or without food.  If you have difficulty swallowing the tablet whole, you may crush it and mix in applesauce just prior to taking your dose.  Take Xarelto exactly as prescribed by your doctor and DO NOT stop taking Xarelto without talking to the doctor who prescribed the medication.  Stopping without other VTE prevention medication to take the place of Xarelto may increase your risk of developing a clot.  After discharge, you should have regular check-up appointments with your healthcare provider that is prescribing your Xarelto.    What do you do if you miss a dose? If you miss a dose, take it as soon as you remember on the same day then continue your regularly scheduled once daily regimen the next day. Do not take two doses of Xarelto on the same day.   Important Safety Information A possible side effect of Xarelto is bleeding. You should call your healthcare provider right away if you experience any of the following: ? Bleeding from an injury or your nose that does  not stop. ? Unusual colored urine (red or dark brown) or unusual colored stools (red or black). ? Unusual bruising for unknown reasons. ? A serious fall or if you hit your head (even if there is no bleeding).  Some medicines may interact with Xarelto and might increase your risk of bleeding while on Xarelto. To help avoid this, consult your healthcare provider or pharmacist prior to using any new prescription or non-prescription medications, including herbals, vitamins, non-steroidal anti-inflammatory drugs (NSAIDs) and supplements.  This website has more information on Xarelto: [email or web address]

## 2016-05-14 NOTE — Discharge Summary (Signed)
Physician Discharge Summary   Patient ID: Maria Munoz MRN: [id number] DOB/AGE: 1965/10/22 50 y.o.  Admit date: 05/13/2016 Discharge date: 05/15/2016  Primary Diagnosis:  Osteoarthritis  Right knee(s)  Admission Diagnoses:  Past Medical History:  Diagnosis Date  . Arthritis   . Herpes    H/O  . History of abnormal Pap smear   . Hypercholesterolemia    Discharge Diagnoses:   Principal Problem:   OA (osteoarthritis) of knee  Estimated body mass index is 27.6 kg/m as calculated from the following:   Height as of this encounter: 5' 6" (1.676 m).   Weight as of this encounter: 77.6 kg (171 lb).  Procedure:  Procedure(s) (LRB): RIGHT TOTAL KNEE ARTHROPLASTY (Right)   Consults: None  HPI: Maria Munoz is a 50 y.o. year old female with end stage OA of her right knee with progressively worsening pain and dysfunction. She has constant pain, with activity and at rest and significant functional deficits with difficulties even with ADLs. She has had extensive non-op management including analgesics, injections of cortisone and viscosupplements, and home exercise program, but remains in significant pain with significant dysfunction.Radiographs show bone on bone arthritis lateral and patelloefmoral. She presents now for right Total Knee Arthroplasty.   Laboratory Data: Admission on 05/13/2016  Component Date Value Ref Range Status  . ABO/RH(D) 05/03/2016 A POS   Final  . WBC 05/14/2016 13.4* 4.0 - 10.5 K/uL Final  . RBC 05/14/2016 3.70* 3.87 - 5.11 MIL/uL Final  . Hemoglobin 05/14/2016 11.9* 12.0 - 15.0 g/dL Final  . HCT 05/14/2016 35.6* 36.0 - 46.0 % Final  . MCV 05/14/2016 96.2  78.0 - 100.0 fL Final  . MCH 05/14/2016 32.2  26.0 - 34.0 pg Final  . MCHC 05/14/2016 33.4  30.0 - 36.0 g/dL Final  . RDW 05/14/2016 13.4  11.5 - 15.5 % Final  . Platelets 05/14/2016 239  150 - 400 K/uL Final  . Sodium 05/14/2016 140  135 - 145 mmol/L Final  . Potassium 05/14/2016 3.4* 3.5 - 5.1 mmol/L Final   . Chloride 05/14/2016 107  101 - 111 mmol/L Final  . CO2 05/14/2016 28  22 - 32 mmol/L Final  . Glucose, Bld 05/14/2016 108* 65 - 99 mg/dL Final  . BUN 05/14/2016 <5* 6 - 20 mg/dL Final  . Creatinine, Ser 05/14/2016 0.45  0.44 - 1.00 mg/dL Final  . Calcium 05/14/2016 8.4* 8.9 - 10.3 mg/dL Final  . GFR calc non Af Amer 05/14/2016 >60  >60 mL/min Final  . GFR calc Af Amer 05/14/2016 >60  >60 mL/min Final   Comment: (NOTE) The eGFR has been calculated using the CKD EPI equation. This calculation has not been validated in all clinical situations. eGFR's persistently <60 mL/min signify possible Chronic Kidney Disease.   Maria Munoz gap 05/14/2016 5  5 - 15 Final  Hospital Outpatient Visit on 05/03/2016  Component Date Value Ref Range Status  . Preg, Serum 05/03/2016 NEGATIVE  NEGATIVE Final   Comment:        THE SENSITIVITY OF THIS METHODOLOGY IS >10 mIU/mL.   Marland Kitchen aPTT 05/03/2016 30  24 - 36 seconds Final  . Prothrombin Time 05/03/2016 11.5  11.4 - 15.2 seconds Final  . INR 05/03/2016 0.84   Final  . ABO/RH(D) 05/13/2016 A POS   Final  . Antibody Screen 05/13/2016 NEG   Final  . Sample Expiration 05/13/2016 05/16/2016   Final  . Extend sample reason 05/13/2016 NO TRANSFUSIONS OR PREGNANCY IN THE PAST 3  MONTHS   Final  . Color, Urine 05/03/2016 YELLOW  YELLOW Final  . APPearance 05/03/2016 CLEAR  CLEAR Final  . Specific Gravity, Urine 05/03/2016 1.009  1.005 - 1.030 Final  . pH 05/03/2016 7.5  5.0 - 8.0 Final  . Glucose, UA 05/03/2016 NEGATIVE  NEGATIVE mg/dL Final  . Hgb urine dipstick 05/03/2016 NEGATIVE  NEGATIVE Final  . Bilirubin Urine 05/03/2016 NEGATIVE  NEGATIVE Final  . Ketones, ur 05/03/2016 NEGATIVE  NEGATIVE mg/dL Final  . Protein, ur 05/03/2016 NEGATIVE  NEGATIVE mg/dL Final  . Nitrite 05/03/2016 NEGATIVE  NEGATIVE Final  . Leukocytes, UA 05/03/2016 NEGATIVE  NEGATIVE Final  . MRSA, PCR 05/03/2016 NEGATIVE  NEGATIVE Final  . Staphylococcus aureus 05/03/2016 POSITIVE*  NEGATIVE Final   Comment:        The Xpert SA Assay (FDA approved for NASAL specimens in patients over 59 years of age), is one component of a comprehensive surveillance program.  Test performance has been validated by [facility] for patients greater than or equal to 50 year old. It is not intended to diagnose infection nor to guide or monitor treatment.   Office Visit on 04/29/2016  Component Date Value Ref Range Status  . WBC 04/29/2016 8.7  4.0 - 10.5 K/uL Final  . RBC 04/29/2016 4.51  3.87 - 5.11 Mil/uL Final  . Hemoglobin 04/29/2016 14.3  12.0 - 15.0 g/dL Final  . HCT 04/29/2016 42.3  36.0 - 46.0 % Final  . MCV 04/29/2016 93.9  78.0 - 100.0 fl Final  . MCHC 04/29/2016 33.8  30.0 - 36.0 g/dL Final  . RDW 04/29/2016 14.0  11.5 - 15.5 % Final  . Platelets 04/29/2016 266.0  150.0 - 400.0 K/uL Final  . Neutrophils Relative % 04/29/2016 64.1  43.0 - 77.0 % Final  . Lymphocytes Relative 04/29/2016 27.2  12.0 - 46.0 % Final  . Monocytes Relative 04/29/2016 4.8  3.0 - 12.0 % Final  . Eosinophils Relative 04/29/2016 2.9  0.0 - 5.0 % Final  . Basophils Relative 04/29/2016 1.0  0.0 - 3.0 % Final  . Neutro Abs 04/29/2016 5.6  1.4 - 7.7 K/uL Final  . Lymphs Abs 04/29/2016 2.4  0.7 - 4.0 K/uL Final  . Monocytes Absolute 04/29/2016 0.4  0.1 - 1.0 K/uL Final  . Eosinophils Absolute 04/29/2016 0.2  0.0 - 0.7 K/uL Final  . Basophils Absolute 04/29/2016 0.1  0.0 - 0.1 K/uL Final  . Sodium 04/29/2016 139  135 - 145 mEq/L Final  . Potassium 04/29/2016 3.9  3.5 - 5.1 mEq/L Final  . Chloride 04/29/2016 104  96 - 112 mEq/L Final  . CO2 04/29/2016 29  19 - 32 mEq/L Final  . Glucose, Bld 04/29/2016 111* 70 - 99 mg/dL Final  . BUN 04/29/2016 10  6 - 23 mg/dL Final  . Creatinine, Ser 04/29/2016 0.53  0.40 - 1.20 mg/dL Final  . Total Bilirubin 04/29/2016 0.5  0.2 - 1.2 mg/dL Final  . Alkaline Phosphatase 04/29/2016 51  39 - 117 U/L Final  . AST 04/29/2016 16  0 - 37 U/L Final  . ALT 04/29/2016 19   0 - 35 U/L Final  . Total Protein 04/29/2016 6.7  6.0 - 8.3 g/dL Final  . Albumin 04/29/2016 4.2  3.5 - 5.2 g/dL Final  . Calcium 04/29/2016 9.4  8.4 - 10.5 mg/dL Final  . GFR 04/29/2016 129.02  >60.00 mL/min Final  . TSH 04/29/2016 1.33  0.35 - 4.50 uIU/mL Final     X-Rays:No results  found.  EKG: Orders placed or performed in visit on 04/29/16  . EKG 12-Lead     Hospital Course: KAITHLYN TEAGLE is a 50 y.o. who was admitted to [facility]. They were brought to the operating room on 05/13/2016 and underwent Procedure(s): RIGHT TOTAL KNEE ARTHROPLASTY.  Patient tolerated the procedure well and was later transferred to the recovery room and then to the orthopaedic floor for postoperative care.  They were given PO and IV analgesics for pain control following their surgery.  They were given 24 hours of postoperative antibiotics of  Anti-infectives    Start     Dose/Rate Route Frequency Ordered Stop   05/14/16 1000  valACYclovir (VALTREX) tablet 500 mg     500 mg Oral Daily 05/13/16 1326     05/13/16 2200  vancomycin (VANCOCIN) IVPB 1000 mg/200 mL premix     1,000 mg 200 mL/hr over 60 Minutes Intravenous Every 12 hours 05/13/16 1326 05/13/16 2229   05/13/16 0812  vancomycin (VANCOCIN) IVPB 1000 mg/200 mL premix     1,000 mg 200 mL/hr over 60 Minutes Intravenous On call to O.R. 05/13/16 1607 05/13/16 1125     and started on DVT prophylaxis in the form of Xarelto.   PT and OT were ordered for total joint protocol.  Discharge planning consulted to help with postop disposition and equipment needs.  Patient had a good night on the evening of surgery and walked 30 feet that afternoon.  They started to get up OOB with therapy on day one. Hemovac drain was pulled without difficulty.  Continued to work with therapy into day two.  Dressing was changed on day two and the incision was healing well. Patient was seen in rounds and was ready to go home following therapy.   Discharge home - No HHPT -  Virtual Therapy at Home Diet - Cardiac diet Follow up - in 2 weeks Activity - WBAT Disposition - Home Condition Upon Discharge - Good D/C Meds - See DC Summary DVT Prophylaxis - Xarelto    Discharge Instructions    Call MD / Call 911    Complete by:  As directed   If you experience chest pain or shortness of breath, CALL 911 and be transported to the hospital emergency room.  If you develope a fever above 101 F, pus (white drainage) or increased drainage or redness at the wound, or calf pain, call your surgeon's office.   Change dressing    Complete by:  As directed   Change dressing daily with sterile 4 x 4 inch gauze dressing and apply TED hose. Do not submerge the incision under water.   Constipation Prevention    Complete by:  As directed   Drink plenty of fluids.  Prune juice may be helpful.  You may use a stool softener, such as Colace (over the counter) 100 mg twice a day.  Use MiraLax (over the counter) for constipation as needed.   Diet - low sodium heart healthy    Complete by:  As directed   Diet Carb Modified    Complete by:  As directed   Diet general    Complete by:  As directed   Discharge instructions    Complete by:  As directed   Pick up stool softner and laxative for home use following surgery while on pain medications. Do not submerge incision under water. Please use good hand washing techniques while changing dressing each day. May shower starting three days after surgery.  Please use a clean towel to pat the incision dry following showers. Continue to use ice for pain and swelling after surgery. Do not use any lotions or creams on the incision until instructed by your surgeon.   Postoperative Constipation Protocol  Constipation - defined medically as fewer than three stools per week and severe constipation as less than one stool per week.  One of the most common issues patients have following surgery is constipation.  Even if you have a regular bowel pattern at  home, your normal regimen is likely to be disrupted due to multiple reasons following surgery.  Combination of anesthesia, postoperative narcotics, change in appetite and fluid intake all can affect your bowels.  In order to avoid complications following surgery, here are some recommendations in order to help you during your recovery period.  Colace (docusate) - Pick up an over-the-counter form of Colace or another stool softener and take twice a day as long as you are requiring postoperative pain medications.  Take with a full glass of water daily.  If you experience loose stools or diarrhea, hold the colace until you stool forms back up.  If your symptoms do not get better within 1 week or if they get worse, check with your doctor.  Dulcolax (bisacodyl) - Pick up over-the-counter and take as directed by the product packaging as needed to assist with the movement of your bowels.  Take with a full glass of water.  Use this product as needed if not relieved by Colace only.   MiraLax (polyethylene glycol) - Pick up over-the-counter to have on hand.  MiraLax is a solution that will increase the amount of water in your bowels to assist with bowel movements.  Take as directed and can mix with a glass of water, juice, soda, coffee, or tea.  Take if you go more than two days without a movement. Do not use MiraLax more than once per day. Call your doctor if you are still constipated or irregular after using this medication for 7 days in a row.  If you continue to have problems with postoperative constipation, please contact the office for further assistance and recommendations.  If you experience "the worst abdominal pain ever" or develop nausea or vomiting, please contact the office immediatly for further recommendations for treatment.   Take Xarelto for two and a half more weeks, then discontinue Xarelto.   Do not put a pillow under the knee. Place it under the heel.    Complete by:  As directed   Do not sit  on low chairs, stoools or toilet seats, as it may be difficult to get up from low surfaces    Complete by:  As directed   Driving restrictions    Complete by:  As directed   No driving until released by the physician.   Increase activity slowly as tolerated    Complete by:  As directed   Lifting restrictions    Complete by:  As directed   No lifting until released by the physician.   Patient may shower    Complete by:  As directed   You may shower without a dressing once there is no drainage.  Do not wash over the wound.  If drainage remains, do not shower until drainage stops.   TED hose    Complete by:  As directed   Use stockings (TED hose) for 3 weeks on both leg(s).  You may remove them at night for sleeping.   Weight  bearing as tolerated    Complete by:  As directed   Laterality:  right   Extremity:  Lower       Medication List    STOP taking these medications   ibuprofen 200 MG tablet Commonly known as:  ADVIL,MOTRIN   meloxicam 15 MG tablet Commonly known as:  MOBIC   multivitamin with minerals Tabs tablet   naproxen sodium 220 MG tablet Commonly known as:  ANAPROX     TAKE these medications   citalopram 20 MG tablet Commonly known as:  CELEXA Take 1 tablet (20 mg total) by mouth daily.   methocarbamol 500 MG tablet Commonly known as:  ROBAXIN Take 1 tablet (500 mg total) by mouth every 6 (six) hours as needed for muscle spasms.   oxyCODONE 5 MG immediate release tablet Commonly known as:  Oxy IR/ROXICODONE Take 1-2 tablets (5-10 mg total) by mouth every 3 (three) hours as needed for moderate pain or severe pain.   rivaroxaban 10 MG Tabs tablet Commonly known as:  XARELTO Take 1 tablet (10 mg total) by mouth daily with breakfast. Take Xarelto for two and a half more weeks, then discontinue Xarelto. Once the patient has completed the blood thinner regimen, then take a Baby 81 mg Aspirin daily for three more weeks.   valACYclovir 500 MG tablet Commonly known as:   VALTREX Take 1 tablet (500 mg total) by mouth daily.      Follow-up Information    [name], MD. Schedule an appointment as soon as possible for a visit on 05/28/2016.   Specialty:  Orthopedic Surgery Why:  Call for appointment for Tuesday 05/28/2016 with Dr. Wynelle Link. Contact information: [address] [place] [address] [phone]           Signed: Arlee Muslim, PA-C Orthopaedic Surgery 05/14/2016, 10:30 PM

## 2016-05-15 LAB — BASIC METABOLIC PANEL
Anion gap: 6 (ref 5–15)
BUN: 6 mg/dL (ref 6–20)
CO2: 29 mmol/L (ref 22–32)
CREATININE: 0.48 mg/dL (ref 0.44–1.00)
Calcium: 8.3 mg/dL — ABNORMAL LOW (ref 8.9–10.3)
Chloride: 105 mmol/L (ref 101–111)
GFR calc Af Amer: >60 mL/min (ref >60)
GFR calc non Af Amer: >60 mL/min (ref >60)
Glucose, Bld: 104 mg/dL — ABNORMAL HIGH (ref 65–99)
POTASSIUM: 3.3 mmol/L — AB (ref 3.5–5.1)
Sodium: 140 mmol/L (ref 135–145)

## 2016-05-15 LAB — CBC
HEMATOCRIT: 30.7 % — AB (ref 36.0–46.0)
Hemoglobin: 10.2 g/dL — ABNORMAL LOW (ref 12.0–15.0)
MCH: 31.2 pg (ref 26.0–34.0)
MCHC: 33.2 g/dL (ref 30.0–36.0)
MCV: 93.9 fL (ref 78.0–100.0)
Platelets: 224 10*3/uL (ref 150–400)
RBC: 3.27 MIL/uL — ABNORMAL LOW (ref 3.87–5.11)
RDW: 13.3 % (ref 11.5–15.5)
WBC: 13 10*3/uL — ABNORMAL HIGH (ref 4.0–10.5)

## 2016-05-15 NOTE — Care Management Note (Signed)
Case Management Note  Patient Details  Name: Maria Munoz MRN: [id number] Date of Birth: 23-Jan-1966  Subjective/Objective:                  RIGHT TOTAL KNEE ARTHROPLASTY (Right) Action/Plan: Discharge planning Expected Discharge Date: 05/15/16              Expected Discharge Plan:  Home/Self Care  In-House Referral:  NA  Discharge planning Services  CM Consult  Post Acute Care Choice:    Choice offered to:  Patient  DME Arranged:  N/A DME Agency:  NA  HH Arranged:  NA HH Agency:  NA  Status of Service:  Completed, signed off  If discussed at [place] of Stay Meetings, dates discussed:    Additional Comments: CM met with pt in room to confirm plan is for Virtual PT; pt and PA note confirms this is the plan.  Pt states she has all DME at home.  No other CM needs were communicated. Dellie Catholic, RN 05/15/2016, 12:51 PM

## 2016-05-15 NOTE — Progress Notes (Signed)
Occupational Therapy Evaluation Patient Details Name: Maria Munoz MRN: [id number] DOB: 1966/04/30 Today's Date: 05/15/2016    History of Present Illness R TKA   Clinical Impression   All OT education completed and pt questions answered. No further OT needs at this time. Will sign off.    Follow Up Recommendations  No OT follow up    Equipment Recommendations  None recommended by OT    Recommendations for Other Services       Precautions / Restrictions Precautions Precautions: Knee Required Braces or Orthoses: Knee Immobilizer - Right Knee Immobilizer - Right: Discontinue once straight leg raise with < 10 degree lag Restrictions Weight Bearing Restrictions: No Other Position/Activity Restrictions: WBAT      Mobility Bed Mobility               General bed mobility comments: NT -- OOB  Transfers Overall transfer level: Needs assistance Equipment used: Rolling walker (2 wheeled) Transfers: Sit to/from Stand Sit to Stand: Supervision              Balance                                            ADL Overall ADL's : Needs assistance/impaired Eating/Feeding: Independent;Sitting   Grooming: Set up;Sitting           Upper Body Dressing : Set up;Sitting   Lower Body Dressing: Minimal assistance;Sit to/from stand   Toilet Transfer: Supervision/safety;Ambulation;BSC;RW   Toileting- Clothing Manipulation and Hygiene: Supervision/safety;Sit to/from stand   Tub/ Banker: Copy Details (indicate cue type and reason): verbal instruction on safe technique; pt reports similar to stairs she just practiced with PT and did not wish to practice shower transfer specifically Functional mobility during ADLs: Supervision/safety;Rolling walker       Vision     Perception     Praxis      Pertinent Vitals/Pain Pain Assessment: 0-10 Pain Score: 3  Pain Location: R knee Pain Descriptors /  Indicators: Aching;Sore Pain Intervention(s): Monitored during session;Ice applied     Hand Dominance     Extremity/Trunk Assessment Upper Extremity Assessment Upper Extremity Assessment: Overall WFL for tasks assessed   Lower Extremity Assessment Lower Extremity Assessment: Defer to PT evaluation       Communication Communication Communication: No difficulties   Cognition Arousal/Alertness: Awake/alert Behavior During Therapy: WFL for tasks assessed/performed Overall Cognitive Status: Within Functional Limits for tasks assessed                     General Comments       Exercises       Shoulder Instructions      Home Living Family/patient expects to be discharged to:: Private residence Living Arrangements: Spouse/significant other Available Help at Discharge: Family Type of Home: House Home Access: Stairs to enter Technical brewer of Steps: 2 Entrance Stairs-Rails: None Home Layout: One level     Bathroom Shower/Tub: Occupational psychologist: Handicapped height Bathroom Accessibility: Yes How Accessible: Accessible via walker Home Equipment: [place] - 2 wheels          Prior Functioning/Environment Level of Independence: Independent             OT Diagnosis: Acute pain   OT Problem List: Decreased strength;Decreased range of motion;Decreased knowledge of use of DME or AE;Pain   OT  Treatment/Interventions:      OT Goals(Current goals can be found in the care plan section) Acute Rehab OT Goals Patient Stated Goal: home today OT Goal Formulation: All assessment and education complete, DC therapy  OT Frequency:     Barriers to D/C:            Co-evaluation              End of Session Nurse Communication: Mobility status  Activity Tolerance: Patient tolerated treatment well Patient left: in chair;with family/visitor present   Time: [id number] OT Time Calculation (min): 13 min Charges:  OT General Charges $OT  Visit: 1 Procedure OT Evaluation $OT Eval Low Complexity: 1 Procedure G-Codes:    Faith Branan A May 19, 2016, 10:39 AM

## 2016-05-15 NOTE — Progress Notes (Signed)
Discharge teaching completed with teach back.Discharge Instructions given and reviewed with pt. Pt. Understands to call tomorrow for follow up appointment. Prescriptions given for Oxycodone, Robaxin, and Xarelto. Awaiting PT to complete gait test.

## 2016-05-15 NOTE — Progress Notes (Signed)
Pt. Discharged to home with spouse, left via wheelchair. No respiratory distress noted

## 2016-05-15 NOTE — Progress Notes (Signed)
   Subjective: 2 Days Post-Op Procedure(s) (LRB): RIGHT TOTAL KNEE ARTHROPLASTY (Right) Patient reports pain as mild.   Patient seen in rounds by Dr. Wynelle Link. Patient is well, but has had some minor complaints of pain in the knee, requiring pain medications Patient is ready to go home  Objective: Vital signs in last 24 hours: Temp:  [97.9 F (36.6 C)-98.7 F (37.1 C)] 98.7 F (37.1 C) (09/13 0523) Pulse Rate:  [66-73] 73 (09/13 0523) Resp:  [14-18] 14 (09/13 0523) BP: (98-119)/(60-71) 107/65 (09/13 0523) SpO2:  [92 %-96 %] 93 % (09/13 0523)  Intake/Output from previous day:  Intake/Output Summary (Last 24 hours) at 05/15/16 0720 Last data filed at 05/14/16 2318  Gross per 24 hour  Intake           901.25 ml  Output              400 ml  Net           501.25 ml    Intake/Output this shift: No intake/output data recorded.  Labs:  Recent Labs  05/14/16 0451 05/15/16 0427  HGB 11.9* 10.2*    Recent Labs  05/14/16 0451 05/15/16 0427  WBC 13.4* 13.0*  RBC 3.70* 3.27*  HCT 35.6* 30.7*  PLT 239 224    Recent Labs  05/14/16 0451 05/15/16 0427  NA 140 140  K 3.4* 3.3*  CL 107 105  CO2 28 29  BUN <5* 6  CREATININE 0.45 0.48  GLUCOSE 108* 104*  CALCIUM 8.4* 8.3*   No results for input(s): LABPT, INR in the last 72 hours.  EXAM: General - Patient is Alert, Appropriate and Oriented Extremity - Neurovascular intact Sensation intact distally Dorsiflexion/Plantar flexion intact Incision - clean, dry, no drainage Motor Function - intact, moving foot and toes well on exam.   Assessment/Plan: 2 Days Post-Op Procedure(s) (LRB): RIGHT TOTAL KNEE ARTHROPLASTY (Right) Procedure(s) (LRB): RIGHT TOTAL KNEE ARTHROPLASTY (Right) Past Medical History:  Diagnosis Date  . Arthritis   . Herpes    H/O  . History of abnormal Pap smear   . Hypercholesterolemia    Principal Problem:   OA (osteoarthritis) of knee  Estimated body mass index is 27.6 kg/m as  calculated from the following:   Height as of this encounter: 5\' 6"  (1.676 m).   Weight as of this encounter: 77.6 kg (171 lb). Up with therapy Discharge home - No HHPT - Virtual Therapy at Home Diet - Cardiac diet Follow up - in 2 weeks Activity - WBAT Disposition - Home Condition Upon Discharge - Good D/C Meds - See DC Summary DVT Prophylaxis - Xarelto   Discharge home - No HHPT - [facility] Therapy at [place], PA-C Orthopaedic Surgery 05/15/2016, 7:20 AM

## 2016-05-15 NOTE — Progress Notes (Signed)
Physical Therapy Treatment Patient Details Name: Maria Munoz MRN: [id number] DOB: 1965-09-03 Today's Date: 05/15/2016    History of Present Illness R TKA    PT Comments    Progressing well, will allow pt a rest break and then return to do walking test.  Follow Up Recommendations  Other (comment) (virtual therapy)     Equipment Recommendations  None recommended by PT    Recommendations for Other Services       Precautions / Restrictions Precautions Precautions: Knee Precaution Comments: D/C KI, IND SLR Required Braces or Orthoses: Knee Immobilizer - Right Knee Immobilizer - Right: Discontinue once straight leg raise with < 10 degree lag Restrictions Weight Bearing Restrictions: No Other Position/Activity Restrictions: WBAT    Mobility  Bed Mobility               General bed mobility comments: NT -- OOB  Transfers Overall transfer level: Needs assistance Equipment used: Rolling walker (2 wheeled) Transfers: Sit to/from Stand Sit to Stand: Supervision         General transfer comment: cues for hand placement and R leg  Ambulation/Gait Ambulation/Gait assistance: Supervision Ambulation Distance (Feet): 45 Feet Assistive device: Rolling walker (2 wheeled) Gait Pattern/deviations: Step-to pattern;Decreased weight shift to right;Trunk flexed     General Gait Details: cues for sequence and RW distance from self   Stairs Stairs: Yes Stairs assistance: Min assist Stair Management: Backwards;With walker Number of Stairs: 3 General stair comments: cues for sequence, technique  Wheelchair Mobility    Modified Rankin (Stroke Patients Only)       Balance                                    Cognition Arousal/Alertness: Awake/alert Behavior During Therapy: WFL for tasks assessed/performed Overall Cognitive Status: Within Functional Limits for tasks assessed                      Exercises Total Joint Exercises Ankle  Circles/Pumps: AROM;Both;10 reps Quad Sets: AROM;Both;10 reps Heel Slides: AAROM;Right;10 reps    General Comments        Pertinent Vitals/Pain Pain Assessment: 0-10 Pain Score: 2  Pain Location: R knee Pain Descriptors / Indicators: Sore Pain Intervention(s): Limited activity within patient's tolerance;Monitored during session;Premedicated before session;Ice applied    Home Living Family/patient expects to be discharged to:: Private residence Living Arrangements: Spouse/significant other Available Help at Discharge: Family Type of Home: House Home Access: Stairs to enter Entrance Stairs-Rails: None Home Layout: One level Home Equipment: Environmental consultant - 2 wheels      Prior Function Level of Independence: Independent          PT Goals (current goals can now be found in the care plan section) Acute Rehab PT Goals Patient Stated Goal: home today PT Goal Formulation: With patient/family Time For Goal Achievement: 05/17/16 Potential to Achieve Goals: Good Progress towards PT goals: Progressing toward goals    Frequency  7X/week    PT Plan Current plan remains appropriate    Co-evaluation             End of Session Equipment Utilized During Treatment: Gait belt Activity Tolerance: Patient tolerated treatment well Patient left: in chair;with call bell/phone within reach;with family/visitor present     Time: [id number] PT Time Calculation (min) (ACUTE ONLY): 23 min  Charges:  $Gait Training: 23-37 mins  G CodesKenyon Ana 05/15/2016, 11:17 AM

## 2016-05-15 NOTE — Progress Notes (Signed)
   05/15/16 1118  PT Visit Information  Last PT Received On 05/15/16  Assistance Needed +1  History of Present Illness R TKA  Subjective Data  Patient Stated Goal home today  Precautions  Precautions Knee  Precaution Comments D/C KI, IND SLR  Restrictions  Other Position/Activity Restrictions WBAT  Pain Assessment  Pain Assessment 0-10  Pain Score 3  Pain Location R knee  Pain Descriptors / Indicators Sore  Pain Intervention(s) Limited activity within patient's tolerance;Monitored during session;Ice applied  Cognition  Arousal/Alertness Awake/alert  Behavior During Therapy WFL for tasks assessed/performed  Overall Cognitive Status Within Functional Limits for tasks assessed  Bed Mobility  General bed mobility comments NT -- OOB  Transfers  Overall transfer level Needs assistance  Equipment used Rolling walker (2 wheeled)  Transfers Sit to/from Stand  Sit to Stand Supervision  General transfer comment cues for hand placement and R leg  Ambulation/Gait  Ambulation/Gait assistance Supervision  Ambulation Distance (Feet) 65 Feet  Assistive device Rolling walker (2 wheeled)  Gait Pattern/deviations Step-to pattern;Decreased weight shift to right  General Gait Details cues for sequence and RW distance from self  Gait velocity 0.50  Gait velocity interpretation <1.8 ft/sec, indicative of risk for recurrent falls  PT - End of Session  Activity Tolerance Patient tolerated treatment well  Patient left in chair;with call bell/phone within reach;with family/visitor present  Nurse Communication Mobility status (mobility status)  PT - Assessment/Plan  PT Plan Current plan remains appropriate  PT Frequency (ACUTE ONLY) 7X/week  Follow Up Recommendations Other (comment) (virtual therapy)  PT equipment None recommended by PT  PT Goal Progression  Progress towards PT goals Progressing toward goals  Acute Rehab PT Goals  PT Goal Formulation With patient/family  Time For Goal  Achievement 05/17/16  Potential to Achieve Goals Good  PT Time Calculation  PT Start Time (ACUTE ONLY) 1053  PT Stop Time (ACUTE ONLY) 1104  PT Time Calculation (min) (ACUTE ONLY) 11 min  PT General Charges  $$ ACUTE PT VISIT 1 Procedure  PT Treatments  $Gait Training 8-22 mins

## 2016-07-27 ENCOUNTER — Other Ambulatory Visit: Payer: Self-pay | Admitting: Internal Medicine

## 2016-08-20 ENCOUNTER — Other Ambulatory Visit: Payer: Self-pay | Admitting: Internal Medicine

## 2016-09-26 ENCOUNTER — Other Ambulatory Visit: Payer: Self-pay | Admitting: Internal Medicine

## 2016-10-05 IMAGING — MG MM DIGITAL SCREENING BILAT W/ CAD
4 series · 4 of 4 positions shown · non-contrast
Comparison: Previous exam(s).

CLINICAL DATA: Screening.

EXAM:
DIGITAL SCREENING BILATERAL MAMMOGRAM WITH CAD

[L CC]
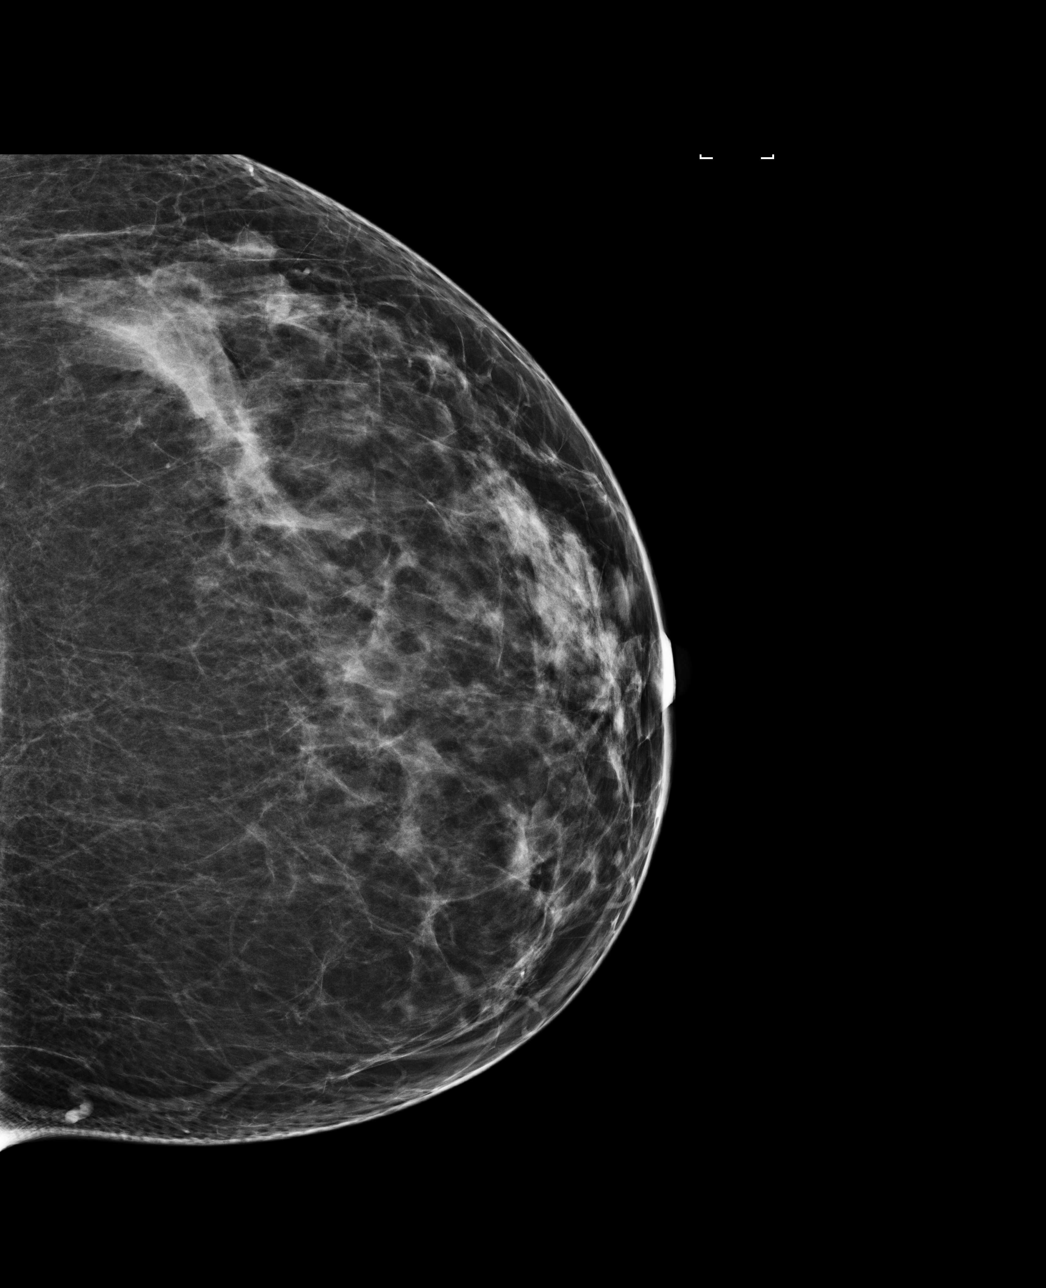

[R MLO]
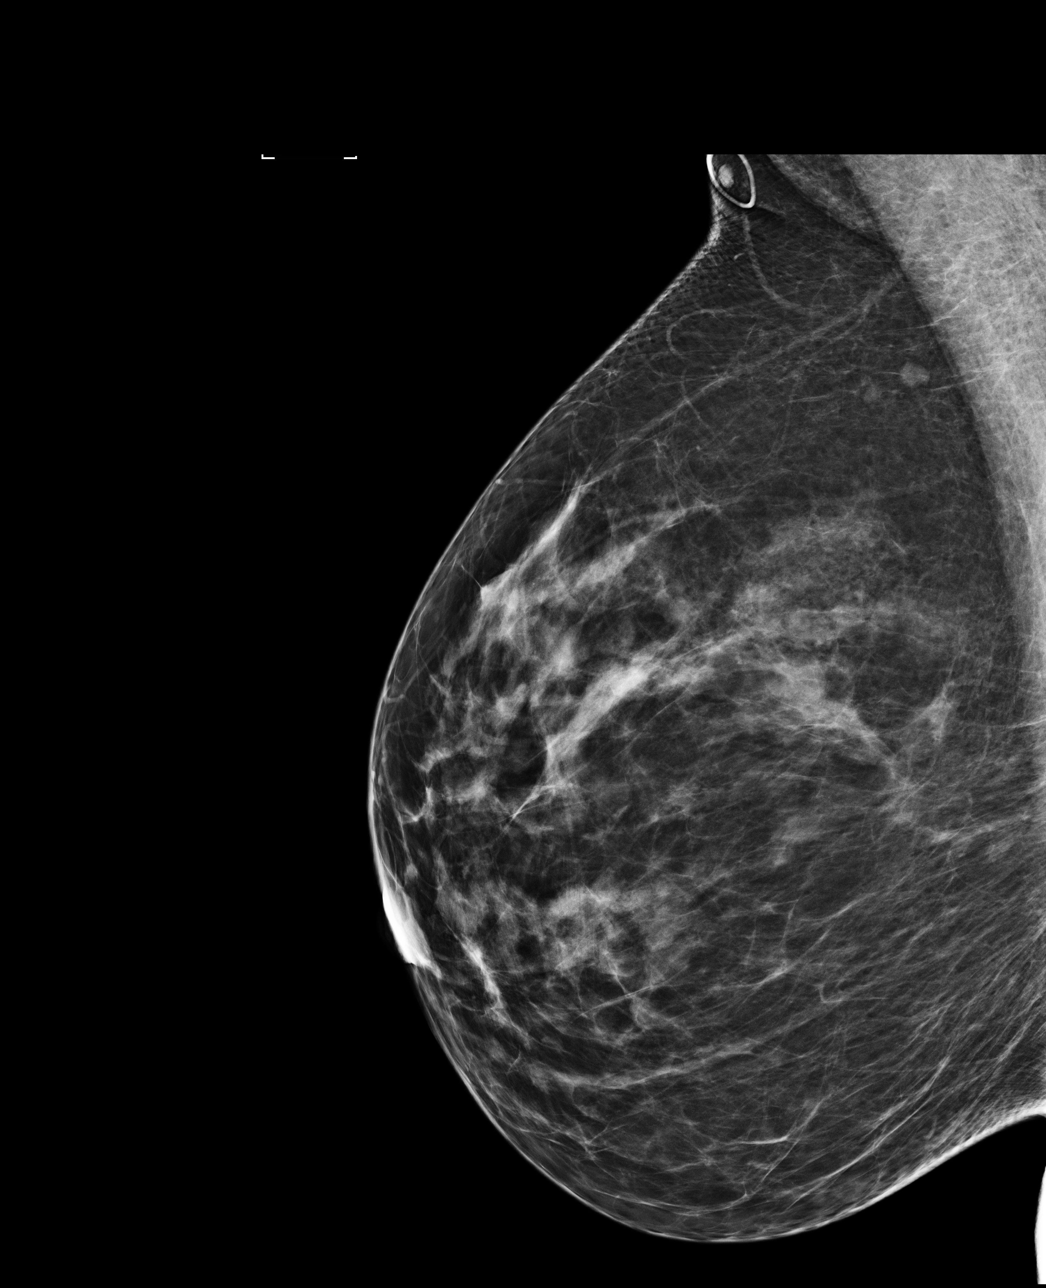

[L MLO]
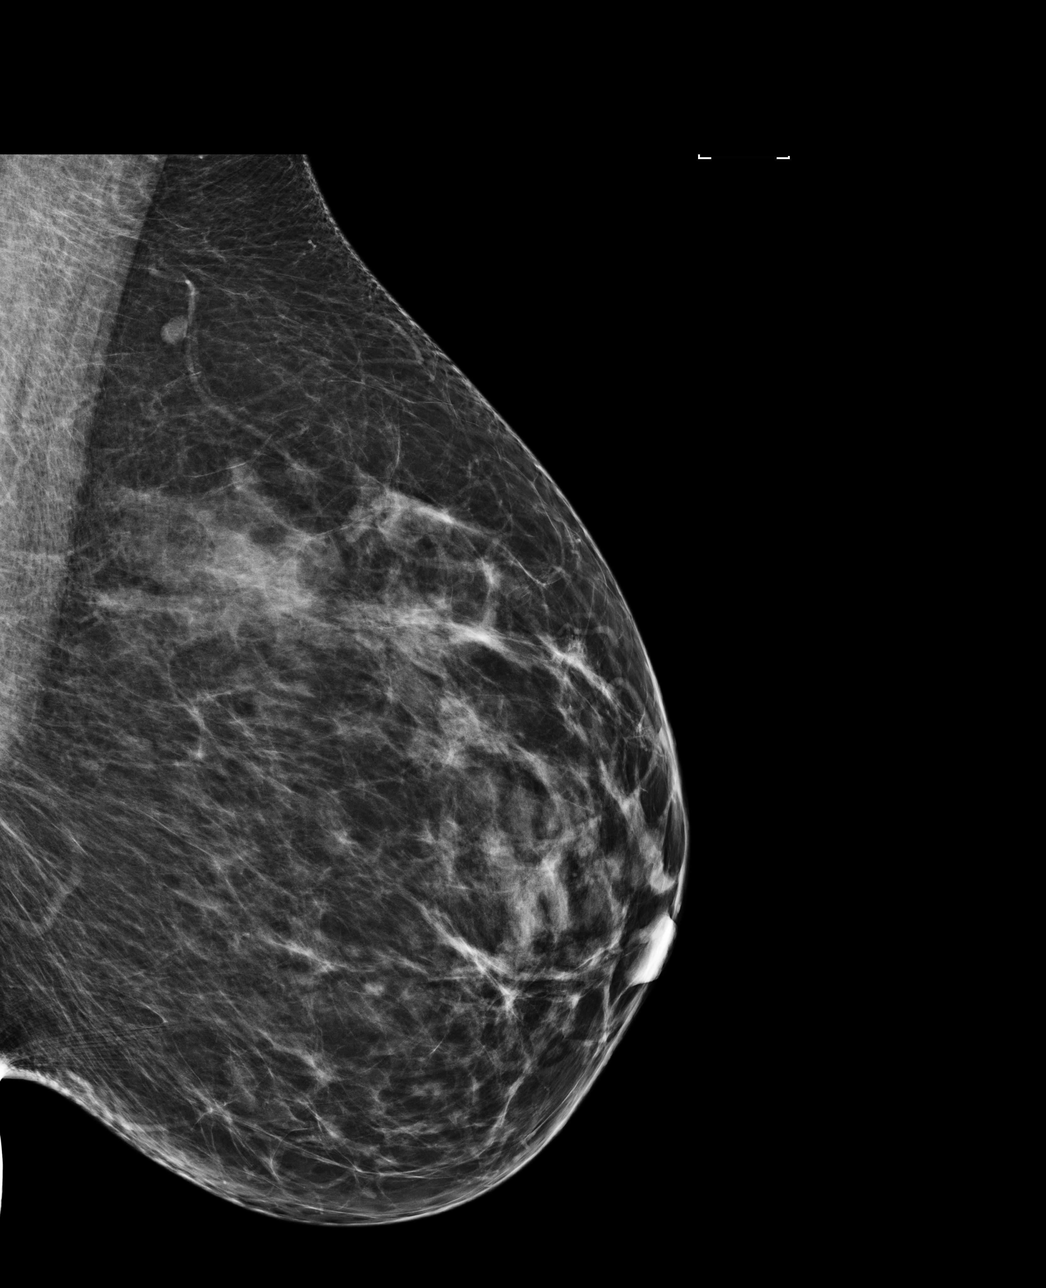

[R CC]
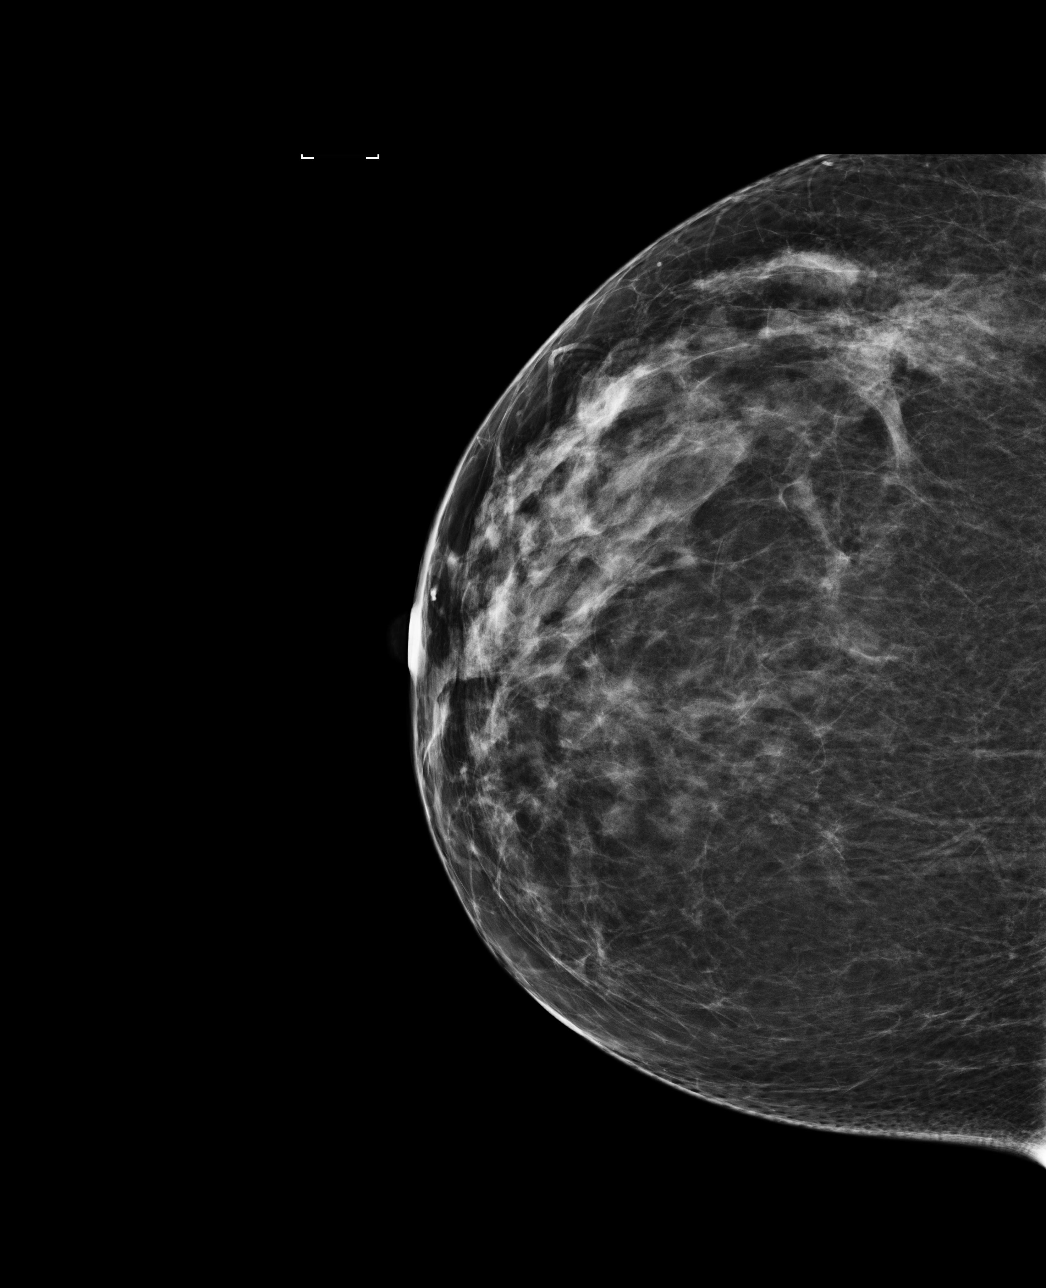

[4 of 4 positions shown; findings below may reference images not displayed]

ACR Breast Density Category c: The breast tissue is heterogeneously
dense, which may obscure small masses.
FINDINGS: There are no findings suspicious for malignancy. Images were
processed with CAD.
IMPRESSION: No mammographic evidence of malignancy. A result letter of this
screening mammogram will be mailed directly to the patient.

RECOMMENDATION:
Screening mammogram in one year. (Code:[id number])

BI-RADS CATEGORY  1: Negative.

## 2016-10-18 ENCOUNTER — Ambulatory Visit: Payer: BC Managed Care – PPO | Admitting: Anesthesiology

## 2016-10-18 ENCOUNTER — Encounter: Payer: Self-pay | Admitting: *Deleted

## 2016-10-18 ENCOUNTER — Ambulatory Visit
Admission: RE | Admit: 2016-10-18 | Discharge: 2016-10-18 | Disposition: A | Discharge status: Home / Self Care | Payer: BC Managed Care – PPO | Source: Ambulatory Visit | Attending: Gastroenterology | Admitting: Gastroenterology

## 2016-10-18 ENCOUNTER — Encounter
Admission: RE | Disposition: A | Discharge status: Home / Self Care | Payer: Self-pay | Source: Ambulatory Visit | Attending: Gastroenterology

## 2016-10-18 DIAGNOSIS — G709 Myoneural disorder, unspecified: Secondary | ICD-10-CM | POA: Diagnosis not present

## 2016-10-18 DIAGNOSIS — M1990 Unspecified osteoarthritis, unspecified site: Secondary | ICD-10-CM | POA: Insufficient documentation

## 2016-10-18 DIAGNOSIS — D122 Benign neoplasm of ascending colon: Secondary | ICD-10-CM | POA: Diagnosis not present

## 2016-10-18 DIAGNOSIS — J449 Chronic obstructive pulmonary disease, unspecified: Secondary | ICD-10-CM | POA: Insufficient documentation

## 2016-10-18 DIAGNOSIS — K5730 Diverticulosis of large intestine without perforation or abscess without bleeding: Secondary | ICD-10-CM | POA: Diagnosis not present

## 2016-10-18 DIAGNOSIS — Z1211 Encounter for screening for malignant neoplasm of colon: Secondary | ICD-10-CM | POA: Insufficient documentation

## 2016-10-18 DIAGNOSIS — Z79899 Other long term (current) drug therapy: Secondary | ICD-10-CM | POA: Insufficient documentation

## 2016-10-18 DIAGNOSIS — K219 Gastro-esophageal reflux disease without esophagitis: Secondary | ICD-10-CM | POA: Insufficient documentation

## 2016-10-18 DIAGNOSIS — F17200 Nicotine dependence, unspecified, uncomplicated: Secondary | ICD-10-CM | POA: Insufficient documentation

## 2016-10-18 DIAGNOSIS — Z880 Allergy status to penicillin: Secondary | ICD-10-CM | POA: Diagnosis not present

## 2016-10-18 DIAGNOSIS — E7800 Pure hypercholesterolemia, unspecified: Secondary | ICD-10-CM | POA: Insufficient documentation

## 2016-10-18 HISTORY — PX: COLONOSCOPY WITH PROPOFOL: SHX5780

## 2016-10-18 SURGERY — COLONOSCOPY WITH PROPOFOL
Anesthesia: General

## 2016-10-18 MED ORDER — PROPOFOL 10 MG/ML IV BOLUS
INTRAVENOUS | Status: DC | PRN
  Administered 2016-10-18: 80 mg via INTRAVENOUS
  Administered 2016-10-18: 30 mg via INTRAVENOUS
  Administered 2016-10-18: 40 mg via INTRAVENOUS
  Administered 2016-10-18 (×2): 30 mg via INTRAVENOUS
  Administered 2016-10-18: 20 mg via INTRAVENOUS
  Administered 2016-10-18: 40 mg via INTRAVENOUS
  Administered 2016-10-18: 30 mg via INTRAVENOUS
  Administered 2016-10-18 (×2): 40 mg via INTRAVENOUS
  Administered 2016-10-18: 20 mg via INTRAVENOUS
  Administered 2016-10-18 (×3): 40 mg via INTRAVENOUS

## 2016-10-18 MED ORDER — PHENYLEPHRINE 40 MCG/ML (10ML) SYRINGE FOR IV PUSH (FOR BLOOD PRESSURE SUPPORT)
PREFILLED_SYRINGE | INTRAVENOUS | Status: DC | PRN
  Administered 2016-10-18: 200 ug via INTRAVENOUS
  Administered 2016-10-18: 100 ug via INTRAVENOUS
  Administered 2016-10-18 (×2): 200 ug via INTRAVENOUS
  Administered 2016-10-18: 100 ug via INTRAVENOUS
  Administered 2016-10-18: 200 ug via INTRAVENOUS

## 2016-10-18 MED ORDER — CLINDAMYCIN PHOSPHATE 600 MG/50ML IV SOLN
600.0000 mg | Freq: Once | INTRAVENOUS | Status: AC
  Administered 2016-10-18: 600 mg via INTRAVENOUS

## 2016-10-18 MED ORDER — PHENYLEPHRINE HCL 10 MG/ML IJ SOLN: INTRAMUSCULAR | Status: DC | PRN

## 2016-10-18 MED ORDER — PROPOFOL 500 MG/50ML IV EMUL
INTRAVENOUS | Status: AC
  Filled 2016-10-18: qty 50

## 2016-10-18 MED ORDER — PROPOFOL 500 MG/50ML IV EMUL
INTRAVENOUS | Status: DC | PRN
  Administered 2016-10-18: 140 ug/kg/min via INTRAVENOUS

## 2016-10-18 MED ORDER — SODIUM CHLORIDE 0.9 % IV SOLN
INTRAVENOUS | Status: DC
  Administered 2016-10-18: 09:00:00 via INTRAVENOUS

## 2016-10-18 NOTE — Anesthesia Postprocedure Evaluation (Signed)
Anesthesia Post Note  Patient: Maria Munoz  Procedure(s) Performed: Procedure(s) (LRB): COLONOSCOPY WITH PROPOFOL (N/A)  Patient location during evaluation: Endoscopy Anesthesia Type: General Level of consciousness: awake and alert Pain management: pain level controlled Vital Signs Assessment: post-procedure vital signs reviewed and stable Respiratory status: spontaneous breathing, nonlabored ventilation, respiratory function stable and patient connected to nasal cannula oxygen Cardiovascular status: blood pressure returned to baseline and stable Postop Assessment: no signs of nausea or vomiting Anesthetic complications: no     Last Vitals:  Vitals:   10/18/16 1129 10/18/16 1139  BP: 104/76 115/80  Pulse: 65 70  Resp: (!) 21 18  Temp:      Last Pain:  Vitals:   10/18/16 1109  TempSrc: Tympanic                 Precious Haws Piscitello

## 2016-10-18 NOTE — Transfer of Care (Signed)
Immediate Anesthesia Transfer of Care Note  Patient: Maria Munoz  Procedure(s) Performed: Procedure(s): COLONOSCOPY WITH PROPOFOL (N/A)  Patient Location: PACU  Anesthesia Type:General  Level of Consciousness: sedated  Airway & Oxygen Therapy: Patient Spontanous Breathing and Patient connected to nasal cannula oxygen  Post-op Assessment: Report given to RN and Post -op Vital signs reviewed and stable  Post vital signs: Reviewed and stable  Last Vitals:  Vitals:   10/18/16 0853  BP: (!) 144/92  Pulse: 94  Resp: 18  Temp: 36.4 C    Last Pain:  Vitals:   10/18/16 0853  TempSrc: Tympanic         Complications: No apparent anesthesia complications

## 2016-10-18 NOTE — Op Note (Addendum)
[facility] Patient Name: Maria Munoz Procedure Date: 10/18/2016 10:07 AM MRN: [id number] Account #: [id number] Date of Birth: Oct 31, 1965 Admit Type: Outpatient Age: 51 Room: [facility] ENDO ROOM 3 Gender: Female Note Status: Finalized Procedure:            Colonoscopy Indications:          Screening for colorectal malignant neoplasm Providers:            Lollie Sails, MD Medicines:            Monitored Anesthesia Care Complications:        No immediate complications. Procedure:            Pre-Anesthesia Assessment:                       - ASA Grade Assessment: III - A patient with severe                        systemic disease.                       After obtaining informed consent, the colonoscope was                        passed under direct vision. Throughout the procedure,                        the patient's blood pressure, pulse, and oxygen                        saturations were monitored continuously. The                        Colonoscope was introduced through the anus and                        advanced to the the cecum, identified by appendiceal                        orifice and ileocecal valve. The colonoscopy was                        unusually difficult due to a tortuous colon. Successful                        completion of the procedure was aided by changing the                        patient to a supine position and changing the patient                        to a prone position. The patient tolerated the                        procedure. The quality of the bowel preparation was                        fair except the ascending colon was poor. Findings:      A 20 mm polyp was found in the proximal ascending colon. The polyp was  sessile. Polypectomy was attempted, initially using a cold snare. Polyp       resection was incomplete with this device. This intervention then       required a different device and polypectomy  technique. The polyp was       removed with a saline injection-lift technique using a cold snare and       cold forcep. Resection and retrieval were complete. To prevent bleeding       after the polypectomy, two hemostatic clips were successfully placed.       There was no bleeding at the end of the maneuver.      Multiple small-mouthed diverticula were found in the sigmoid colon,       descending colon and transverse colon.      The digital rectal exam was normal. Impression:           - One 20 mm polyp in the proximal ascending colon,                        removed using injection-lift and a cold snare. Resected                        and retrieved. Clips were placed.                       - Diverticulosis in the sigmoid colon, in the                        descending colon and in the transverse colon. Recommendation:       - Discharge patient to home (ambulatory). Procedure Code(s):    --- Professional ---                       [phone], Colonoscopy, flexible; with removal of tumor(s),                        polyp(s), or other lesion(s) by snare technique                       [id number], Colonoscopy, flexible; with directed submucosal                        injection(s), any substance CPT copyright 2016 American Medical Association. All rights reserved. The codes documented in this report are preliminary and upon coder review may  be revised to meet current compliance requirements. Lollie Sails, MD 10/18/2016 11:10:40 AM This report has been signed electronically. Number of Addenda: 0 Note Initiated On: 10/18/2016 10:07 AM Scope Withdrawal Time: 0 hours 38 minutes 17 seconds  Total Procedure Duration: 0 hours 51 minutes 50 seconds       [facility]

## 2016-10-18 NOTE — Anesthesia Post-op Follow-up Note (Signed)
Anesthesia QCDR form completed.        

## 2016-10-18 NOTE — H&P (Signed)
Outpatient short stay form Pre-procedure 10/18/2016 10:05 AM Maria Sails MD  Primary Physician: Dr. Einar Pheasant  Reason for visit:  Colonoscopy  History of present illness:  Patient is a 51 year old female presenting today as above. She tolerated her prep well. She takes no aspirin products or blood thinning agents. She did have a total knee replacement several months ago and has been given clindamycin IV this morning. Although the chart states that she takes Xarelto she states she has not taken that since her knee surgery.    Current Facility-Administered Medications:  .  0.9 %  sodium chloride infusion, , Intravenous, Continuous, Maria Sails, MD, Last Rate: 20 mL/hr at 10/18/16 [id number]  Prescriptions Prior to Admission  Medication Sig Dispense Refill Last Dose  . citalopram (CELEXA) 20 MG tablet Take 1 tablet (20 mg total) by mouth daily. 30 tablet 0 10/17/2016 at Unknown time  . meloxicam (MOBIC) 15 MG tablet Take 15 mg by mouth daily.   10/17/2016 at Unknown time  . valACYclovir (VALTREX) 500 MG tablet Take 1 tablet (500 mg total) by mouth daily. 30 tablet 5 10/17/2016 at Unknown time  . methocarbamol (ROBAXIN) 500 MG tablet Take 1 tablet (500 mg total) by mouth every 6 (six) hours as needed for muscle spasms. 80 tablet 0   . oxyCODONE (OXY IR/ROXICODONE) 5 MG immediate release tablet Take 1-2 tablets (5-10 mg total) by mouth every 3 (three) hours as needed for moderate pain or severe pain. 80 tablet 0   . rivaroxaban (XARELTO) 10 MG TABS tablet Take 1 tablet (10 mg total) by mouth daily with breakfast. Take Xarelto for two and a half more weeks, then discontinue Xarelto. Once the patient has completed the blood thinner regimen, then take a Baby 81 mg Aspirin daily for three more weeks. 19 tablet 0      Allergies  Allergen Reactions  . Spinach Anaphylaxis and Other (See Comments)    Most dark, leafy greens  . Ampicillin Hives    Has patient had a PCN reaction causing  immediate rash, facial/tongue/throat swelling, SOB or lightheadedness with hypotension:unsure Has patient had a PCN reaction causing severe rash involving mucus membranes or skin necrosis:unsure Has patient had a PCN reaction that required hospitalization:No Has patient had a PCN reaction occurring within the last 10 years:No If all of the above answers are "NO", then may proceed with Cephalosporin use. Childhood Reaction     Past Medical History:  Diagnosis Date  . Arthritis   . Herpes    H/O  . History of abnormal Pap smear   . Hypercholesterolemia     Review of systems:      Physical Exam    Heart and lungs: Regular rate and rhythm without rub or gallop, lungs are bilaterally clear.    HEENT: Normocephalic atraumatic eyes are anicteric    Other:     Pertinant exam for procedure: Soft nontender nondistended bowel sounds positive normoactive.    Planned proceedures: Colonoscopy and indicated procedures. I have discussed the risks benefits and complications of procedures to include not limited to bleeding, infection, perforation and the risk of sedation and the patient wishes to proceed.    Maria Sails, MD Gastroenterology 10/18/2016  10:05 AM

## 2016-10-18 NOTE — Anesthesia Preprocedure Evaluation (Signed)
Anesthesia Evaluation  Patient identified by MRN, date of birth, ID band Patient awake    Reviewed: Allergy & Precautions, H&P , NPO status , Patient's Chart, lab work & pertinent test results  History of Anesthesia Complications (+) PONV and history of anesthetic complications  Airway Mallampati: III  TM Distance: >3 FB Neck ROM: full    Dental  (+) Poor Dentition, Chipped, Missing   Pulmonary neg shortness of breath, COPD, Current Smoker,    Pulmonary exam normal breath sounds clear to auscultation       Cardiovascular Exercise Tolerance: Good (-) angina(-) Past MI and (-) DOE negative cardio ROS Normal cardiovascular exam Rhythm:regular Rate:Normal     Neuro/Psych  Neuromuscular disease negative psych ROS   GI/Hepatic negative GI ROS, Neg liver ROS, neg GERD  ,  Endo/Other  negative endocrine ROS  Renal/GU negative Renal ROS  negative genitourinary   Musculoskeletal  (+) Arthritis ,   Abdominal   Peds  Hematology negative hematology ROS (+)   Anesthesia Other Findings Signs and symptoms suggestive of sleep apnea   Past Medical History: No date: Arthritis No date: Herpes     Comment: H/O No date: History of abnormal Pap smear No date: Hypercholesterolemia  Past Surgical History: 2009: ANTERIOR CRUCIATE LIGAMENT REPAIR Left No date: CERVICAL BIOPSY  W/ LOOP ELECTRODE EXCISION No date: JOINT REPLACEMENT     Comment: RT Knee No date: NOVASURE ABLATION 05/13/2016: TOTAL KNEE ARTHROPLASTY Right     Comment: Procedure: RIGHT TOTAL KNEE ARTHROPLASTY;                Surgeon: Gaynelle Arabian, MD;  Location: [facility] ORS;               Service: Orthopedics;  Laterality: Right; No date: TYMPANOPLASTY  BMI    Body Mass Index:  26.63 kg/m      Reproductive/Obstetrics negative OB ROS                             Anesthesia Physical Anesthesia Plan  ASA: III  Anesthesia Plan: General    Post-op Pain Management:    Induction:   Airway Management Planned:   Additional Equipment:   Intra-op Plan:   Post-operative Plan:   Informed Consent: I have reviewed the patients History and Physical, chart, labs and discussed the procedure including the risks, benefits and alternatives for the proposed anesthesia with the patient or authorized representative who has indicated his/her understanding and acceptance.   Dental Advisory Given  Plan Discussed with: Anesthesiologist, CRNA and Surgeon  Anesthesia Plan Comments:         Anesthesia Quick Evaluation

## 2016-10-21 ENCOUNTER — Encounter: Payer: Self-pay | Admitting: Gastroenterology

## 2016-10-21 LAB — SURGICAL PATHOLOGY

## 2016-10-22 ENCOUNTER — Encounter: Payer: Self-pay | Admitting: Internal Medicine

## 2016-10-22 DIAGNOSIS — Z86010 Personal history of colonic polyps: Secondary | ICD-10-CM | POA: Insufficient documentation

## 2016-10-28 ENCOUNTER — Other Ambulatory Visit: Payer: Self-pay | Admitting: Internal Medicine

## 2016-10-30 ENCOUNTER — Encounter: Payer: Self-pay | Admitting: Internal Medicine

## 2016-10-30 ENCOUNTER — Ambulatory Visit (INDEPENDENT_AMBULATORY_CARE_PROVIDER_SITE_OTHER): Payer: BC Managed Care – PPO | Admitting: Internal Medicine

## 2016-10-30 VITALS — BP 118/68 | HR 62 | Temp 98.7°F | Resp 18 | Ht 67.0 in | Wt 177.6 lb

## 2016-10-30 DIAGNOSIS — Z1231 Encounter for screening mammogram for malignant neoplasm of breast: Secondary | ICD-10-CM | POA: Diagnosis not present

## 2016-10-30 DIAGNOSIS — E7800 Pure hypercholesterolemia, unspecified: Secondary | ICD-10-CM

## 2016-10-30 DIAGNOSIS — Z86010 Personal history of colonic polyps: Secondary | ICD-10-CM | POA: Diagnosis not present

## 2016-10-30 DIAGNOSIS — M179 Osteoarthritis of knee, unspecified: Secondary | ICD-10-CM

## 2016-10-30 DIAGNOSIS — M1710 Unilateral primary osteoarthritis, unspecified knee: Secondary | ICD-10-CM

## 2016-10-30 DIAGNOSIS — Z23 Encounter for immunization: Secondary | ICD-10-CM

## 2016-10-30 DIAGNOSIS — Z1239 Encounter for other screening for malignant neoplasm of breast: Secondary | ICD-10-CM

## 2016-10-30 DIAGNOSIS — F439 Reaction to severe stress, unspecified: Secondary | ICD-10-CM | POA: Diagnosis not present

## 2016-10-30 MED ORDER — CITALOPRAM HYDROBROMIDE 20 MG PO TABS: ORAL_TABLET | ORAL | 3 refills | Status: DC

## 2016-10-30 NOTE — Progress Notes (Signed)
Patient ID: Maria Munoz, female   DOB: May 19, 1966, 51 y.o.   MRN: [id number]   Subjective:    Patient ID: Maria Munoz, female    DOB: 12/02/1965, 51 y.o.   MRN: [id number]  HPI  Patient here for a scheduled follow up.  Is s/p TKR 05/13/16.  Doing well.  Feels good.  Good rom.  No pain in her knee.  No chest pain.  Plans to start exercising more.  Has not been exercising.  No sob.  Just had colonoscopy.  Polyp removed.  No nausea or vomiting.  Bowels stable.  Has changed jobs.  Stress is better.  Citalopram working well.     Past Medical History:  Diagnosis Date  . Arthritis   . Herpes    H/O  . History of abnormal Pap smear   . Hypercholesterolemia    Past Surgical History:  Procedure Laterality Date  . ANTERIOR CRUCIATE LIGAMENT REPAIR Left 2009  . CERVICAL BIOPSY  W/ LOOP ELECTRODE EXCISION    . COLONOSCOPY WITH PROPOFOL N/A 10/18/2016   Procedure: COLONOSCOPY WITH PROPOFOL;  Surgeon: Lollie Sails, MD;  Location[facility] ENDOSCOPY;  Service: Endoscopy;  Laterality: N/A;  . JOINT REPLACEMENT     RT Knee  . NOVASURE ABLATION    . TOTAL KNEE ARTHROPLASTY Right 05/13/2016   Procedure: RIGHT TOTAL KNEE ARTHROPLASTY;  Surgeon: Gaynelle Arabian, MD;  Location: [facility] ORS;  Service: Orthopedics;  Laterality: Right;  . TYMPANOPLASTY     Family History  Problem Relation Age of Onset  . Arthritis Mother   . Hyperlipidemia Mother   . Hypertension Mother   . Cancer Father     prostate  . Hyperlipidemia Father   . Hypertension Father   . Alcohol abuse Maternal Grandfather   . Breast cancer Neg Hx    Social History   Social History  . Marital status: Married    Spouse name: N/A  . Number of children: N/A  . Years of education: N/A   Social History Main Topics  . Smoking status: Current Every Day Smoker    Packs/day: 0.25    Years: 3.00    Types: Cigarettes  . Smokeless tobacco: Never Used     Comment: 2 cigarettes a day  . Alcohol use 0.0 oz/week     Comment: socially  . Drug  use: No  . Sexual activity: Not Asked   Other Topics Concern  . None   Social History Narrative  . None    Outpatient Encounter Prescriptions as of 10/30/2016  Medication Sig  . citalopram (CELEXA) 20 MG tablet Take 1 tablet (20 mg total) by mouth daily.  . meloxicam (MOBIC) 15 MG tablet Take 15 mg by mouth daily.  . valACYclovir (VALTREX) 500 MG tablet Take 1 tablet (500 mg total) by mouth daily.  . [DISCONTINUED] citalopram (CELEXA) 20 MG tablet Take 1 tablet (20 mg total) by mouth daily.  . [DISCONTINUED] methocarbamol (ROBAXIN) 500 MG tablet Take 1 tablet (500 mg total) by mouth every 6 (six) hours as needed for muscle spasms.  . [DISCONTINUED] oxyCODONE (OXY IR/ROXICODONE) 5 MG immediate release tablet Take 1-2 tablets (5-10 mg total) by mouth every 3 (three) hours as needed for moderate pain or severe pain.  . [DISCONTINUED] rivaroxaban (XARELTO) 10 MG TABS tablet Take 1 tablet (10 mg total) by mouth daily with breakfast. Take Xarelto for two and a half more weeks, then discontinue Xarelto. Once the patient has completed the blood thinner regimen, then take  a Baby 81 mg Aspirin daily for three more weeks.   No facility-administered encounter medications on file as of 10/30/2016.     Review of Systems  Constitutional: Negative for appetite change and unexpected weight change.  HENT: Negative for congestion and sinus pressure.   Respiratory: Negative for cough, chest tightness and shortness of breath.   Cardiovascular: Negative for chest pain, palpitations and leg swelling.  Gastrointestinal: Negative for abdominal pain, diarrhea, nausea and vomiting.  Genitourinary: Negative for difficulty urinating and dysuria.  Musculoskeletal: Negative for back pain and myalgias.  Skin: Negative for color change and rash.  Neurological: Negative for dizziness, light-headedness and headaches.  Psychiatric/Behavioral: Negative for agitation and dysphoric mood.       Objective:    Physical  Exam  Constitutional: She appears well-developed and well-nourished. No distress.  HENT:  Nose: Nose normal.  Mouth/Throat: Oropharynx is clear and moist.  Neck: Neck supple. No thyromegaly present.  Cardiovascular: Normal rate and regular rhythm.   Pulmonary/Chest: Breath sounds normal. No respiratory distress. She has no wheezes.  Abdominal: Soft. Bowel sounds are normal. There is no tenderness.  Musculoskeletal: She exhibits no edema or tenderness.  Lymphadenopathy:    She has no cervical adenopathy.  Skin: No rash noted. No erythema.  Psychiatric: She has a normal mood and affect. Her behavior is normal.    BP 118/68 (BP Location: Left Arm, Patient Position: Sitting, Cuff Size: Large)   Pulse 62   Temp 98.7 F (37.1 C) (Oral)   Resp 18   Ht 5\' 7"  (1.702 m)   Wt 177 lb 9.6 oz (80.6 kg)   SpO2 99%   BMI 27.82 kg/m  Wt Readings from Last 3 Encounters:  10/30/16 177 lb 9.6 oz (80.6 kg)  10/18/16 170 lb (77.1 kg)  05/13/16 171 lb (77.6 kg)     Lab Results  Component Value Date   WBC 13.0 (H) 05/15/2016   HGB 10.2 (L) 05/15/2016   HCT 30.7 (L) 05/15/2016   PLT 224 05/15/2016   GLUCOSE 104 (H) 05/15/2016   CHOL 199 01/18/2015   TRIG 56.0 01/18/2015   HDL 102.90 01/18/2015   LDLDIRECT 104.1 07/23/2013   LDLCALC 85 01/18/2015   ALT 19 04/29/2016   AST 16 04/29/2016   NA 140 05/15/2016   K 3.3 (L) 05/15/2016   CL 105 05/15/2016   CREATININE 0.48 05/15/2016   BUN 6 05/15/2016   CO2 29 05/15/2016   TSH 1.33 04/29/2016   INR 0.84 05/03/2016       Assessment & Plan:   Problem List Items Addressed This Visit    History of colon polyps    Just had colonoscopy 10/18/16 as outlined.        Hypercholesterolemia    Low cholesterol diet and exercise.  Follow lipid panel.   Lab Results  Component Value Date   CHOL 199 01/18/2015   HDL 102.90 01/18/2015   LDLCALC 85 01/18/2015   LDLDIRECT 104.1 07/23/2013   TRIG 56.0 01/18/2015   CHOLHDL 2 01/18/2015         OA (osteoarthritis) of knee    S/p TKR 05/13/16.  Doing well.  Follow.        Stress    On citalopram.  Feels she is doing well.  Follow.         Other Visit Diagnoses    Breast cancer screening    -  Primary   Relevant Orders   MM DIGITAL SCREENING BILATERAL   Encounter for  immunization       Relevant Orders   Flu Vaccine QUAD 36+ mos IM (Completed)       Einar Pheasant, MD

## 2016-10-30 NOTE — Progress Notes (Signed)
Pre-visit discussion using our clinic review tool. No additional management support is needed unless otherwise documented below in the visit note.  

## 2016-11-03 ENCOUNTER — Encounter: Payer: Self-pay | Admitting: Internal Medicine

## 2016-11-03 NOTE — Assessment & Plan Note (Signed)
Just had colonoscopy 10/18/16 as outlined.

## 2016-11-03 NOTE — Assessment & Plan Note (Signed)
Low cholesterol diet and exercise.  Follow lipid panel.   Lab Results  Component Value Date   CHOL 199 01/18/2015   HDL 102.90 01/18/2015   LDLCALC 85 01/18/2015   LDLDIRECT 104.1 07/23/2013   TRIG 56.0 01/18/2015   CHOLHDL 2 01/18/2015

## 2016-11-03 NOTE — Assessment & Plan Note (Signed)
On citalopram.  Feels she is doing well.  Follow.

## 2016-11-03 NOTE — Assessment & Plan Note (Signed)
S/p TKR 05/13/16.  Doing well.  Follow.

## 2016-11-15 ENCOUNTER — Other Ambulatory Visit (INDEPENDENT_AMBULATORY_CARE_PROVIDER_SITE_OTHER): Payer: BC Managed Care – PPO

## 2016-11-15 DIAGNOSIS — E7800 Pure hypercholesterolemia, unspecified: Secondary | ICD-10-CM

## 2016-11-15 LAB — HEPATIC FUNCTION PANEL
ALT: 17 U/L (ref 0–35)
AST: 15 U/L (ref 0–37)
Albumin: 4.5 g/dL (ref 3.5–5.2)
Alkaline Phosphatase: 60 U/L (ref 39–117)
BILIRUBIN DIRECT: 0.1 mg/dL (ref 0.0–0.3)
TOTAL PROTEIN: 6.8 g/dL (ref 6.0–8.3)
Total Bilirubin: 0.5 mg/dL (ref 0.2–1.2)

## 2016-11-15 LAB — BASIC METABOLIC PANEL
BUN: 11 mg/dL (ref 6–23)
CHLORIDE: 102 meq/L (ref 96–112)
CO2: 30 meq/L (ref 19–32)
Calcium: 9.9 mg/dL (ref 8.4–10.5)
Creatinine, Ser: 0.57 mg/dL (ref 0.40–1.20)
GFR: 118.38 mL/min (ref >60.00)
Glucose, Bld: 87 mg/dL (ref 70–99)
POTASSIUM: 4.1 meq/L (ref 3.5–5.1)
Sodium: 137 mEq/L (ref 135–145)

## 2016-11-15 LAB — CBC WITH DIFFERENTIAL/PLATELET
BASOS PCT: 1.1 % (ref 0.0–3.0)
Basophils Absolute: 0.1 10*3/uL (ref 0.0–0.1)
EOS PCT: 7.3 % — AB (ref 0.0–5.0)
Eosinophils Absolute: 0.6 10*3/uL (ref 0.0–0.7)
HEMATOCRIT: 43.1 % (ref 36.0–46.0)
HEMOGLOBIN: 14.5 g/dL (ref 12.0–15.0)
LYMPHS PCT: 35.9 % (ref 12.0–46.0)
Lymphs Abs: 3 10*3/uL (ref 0.7–4.0)
MCHC: 33.6 g/dL (ref 30.0–36.0)
MCV: 95 fl (ref 78.0–100.0)
MONOS PCT: 5 % (ref 3.0–12.0)
Monocytes Absolute: 0.4 10*3/uL (ref 0.1–1.0)
NEUTROS ABS: 4.2 10*3/uL (ref 1.4–7.7)
Neutrophils Relative %: 50.7 % (ref 43.0–77.0)
PLATELETS: 265 10*3/uL (ref 150.0–400.0)
RBC: 4.54 Mil/uL (ref 3.87–5.11)
RDW: 13.5 % (ref 11.5–15.5)
WBC: 8.4 10*3/uL (ref 4.0–10.5)

## 2016-11-15 LAB — LIPID PANEL
CHOL/HDL RATIO: 2
Cholesterol: 222 mg/dL — ABNORMAL HIGH (ref 0–200)
HDL: 99 mg/dL (ref >39.00)
LDL Cholesterol: 106 mg/dL — ABNORMAL HIGH (ref 0–99)
NonHDL: 122.55
TRIGLYCERIDES: 83 mg/dL (ref 0.0–149.0)
VLDL: 16.6 mg/dL (ref 0.0–40.0)

## 2016-11-18 ENCOUNTER — Encounter: Payer: Self-pay | Admitting: Internal Medicine

## 2016-11-27 ENCOUNTER — Ambulatory Visit
Admission: RE | Admit: 2016-11-27 | Discharge: 2016-11-27 | Disposition: A | Discharge status: Home / Self Care | Payer: BC Managed Care – PPO | Source: Ambulatory Visit | Attending: Internal Medicine | Admitting: Internal Medicine

## 2016-11-27 DIAGNOSIS — Z1231 Encounter for screening mammogram for malignant neoplasm of breast: Secondary | ICD-10-CM | POA: Diagnosis not present

## 2016-11-27 DIAGNOSIS — Z1239 Encounter for other screening for malignant neoplasm of breast: Secondary | ICD-10-CM

## 2016-11-27 DIAGNOSIS — Z23 Encounter for immunization: Secondary | ICD-10-CM

## 2016-12-03 ENCOUNTER — Other Ambulatory Visit: Payer: Self-pay | Admitting: Podiatry

## 2017-01-05 ENCOUNTER — Encounter: Payer: Self-pay | Admitting: Internal Medicine

## 2017-01-13 NOTE — Telephone Encounter (Signed)
Hard copy mailed  

## 2017-02-11 ENCOUNTER — Encounter: Payer: Self-pay | Admitting: Emergency Medicine

## 2017-02-11 ENCOUNTER — Ambulatory Visit
Admission: EM | Admit: 2017-02-11 | Discharge: 2017-02-11 | Disposition: A | Discharge status: Home / Self Care | Payer: BC Managed Care – PPO | Attending: Family Medicine | Admitting: Family Medicine

## 2017-02-11 DIAGNOSIS — R062 Wheezing: Secondary | ICD-10-CM | POA: Diagnosis not present

## 2017-02-11 DIAGNOSIS — R05 Cough: Secondary | ICD-10-CM | POA: Diagnosis not present

## 2017-02-11 DIAGNOSIS — J029 Acute pharyngitis, unspecified: Secondary | ICD-10-CM | POA: Diagnosis not present

## 2017-02-11 DIAGNOSIS — R5383 Other fatigue: Secondary | ICD-10-CM

## 2017-02-11 DIAGNOSIS — R059 Cough, unspecified: Secondary | ICD-10-CM

## 2017-02-11 MED ORDER — HYDROCOD POLST-CPM POLST ER 10-8 MG/5ML PO SUER: 5.0000 mL | Freq: Two times a day (BID) | ORAL | 0 refills | Status: DC | PRN

## 2017-02-11 MED ORDER — DOXYCYCLINE HYCLATE 100 MG PO TABS: 100.0000 mg | ORAL_TABLET | Freq: Two times a day (BID) | ORAL | 0 refills | Status: DC

## 2017-02-11 MED ORDER — PREDNISONE 20 MG PO TABS: 20.0000 mg | ORAL_TABLET | Freq: Every day | ORAL | 0 refills | Status: DC

## 2017-02-11 NOTE — ED Provider Notes (Signed)
[facility]    CSN: [id number] Arrival date & time: 02/11/17  0901     History   Chief Complaint Chief Complaint  Patient presents with  . Cough    HPI Maria Munoz is a 51 y.o. female.   The history is provided by the patient.  Cough  Associated symptoms: sore throat and wheezing   URI  Presenting symptoms: congestion, cough, facial pain, fatigue and sore throat   Severity:  Moderate Onset quality:  Sudden Duration:  6 days Timing:  Constant Progression:  Worsening Chronicity:  New Relieved by:  Nothing Ineffective treatments:  OTC medications Associated symptoms: sinus pain and wheezing   Risk factors: sick contacts   Risk factors: not elderly, no chronic cardiac disease, no chronic kidney disease, no diabetes mellitus, no immunosuppression, no recent illness and no recent travel  Chronic respiratory disease: chronic smoker.     Past Medical History:  Diagnosis Date  . Arthritis   . Herpes    H/O  . History of abnormal Pap smear   . Hypercholesterolemia     Patient Active Problem List   Diagnosis Date Noted  . History of colon polyps 10/22/2016  . OA (osteoarthritis) of knee 05/13/2016  . Health care maintenance 01/01/2015  . Neck pain 12/19/2013  . CTS (carpal tunnel syndrome) 12/19/2013  . Obesity (BMI 30.0-34.9) 12/19/2013  . Herpes 07/15/2013  . Knee pain 07/15/2013  . Hypercholesterolemia 07/15/2013  . Abnormal Pap smear of cervix 07/15/2013  . Stress 07/15/2013    Past Surgical History:  Procedure Laterality Date  . ANTERIOR CRUCIATE LIGAMENT REPAIR Left 2009  . CERVICAL BIOPSY  W/ LOOP ELECTRODE EXCISION    . COLONOSCOPY WITH PROPOFOL N/A 10/18/2016   Procedure: COLONOSCOPY WITH PROPOFOL;  Surgeon: Lollie Sails, MD;  Location: [facility] ENDOSCOPY;  Service: Endoscopy;  Laterality: N/A;  . JOINT REPLACEMENT     RT Knee  . NOVASURE ABLATION    . TOTAL KNEE ARTHROPLASTY Right 05/13/2016   Procedure: RIGHT TOTAL KNEE ARTHROPLASTY;   Surgeon: Gaynelle Arabian, MD;  Location: [facility] ORS;  Service: Orthopedics;  Laterality: Right;  . TYMPANOPLASTY      OB History    No data available       Home Medications    Prior to Admission medications   Medication Sig Start Date End Date Taking? Authorizing Provider  chlorpheniramine-HYDROcodone (TUSSIONEX PENNKINETIC ER) 10-8 MG/5ML SUER Take 5 mLs by mouth every 12 (twelve) hours as needed. 02/11/17   Norval Gable, MD  citalopram (CELEXA) 20 MG tablet Take 1 tablet (20 mg total) by mouth daily. 10/30/16   Einar Pheasant, MD  doxycycline (VIBRA-TABS) 100 MG tablet Take 1 tablet (100 mg total) by mouth 2 (two) times daily. 02/11/17   Norval Gable, MD  predniSONE (DELTASONE) 20 MG tablet Take 1 tablet (20 mg total) by mouth daily. 02/11/17   Norval Gable, MD  valACYclovir (VALTREX) 500 MG tablet Take 1 tablet (500 mg total) by mouth daily. 08/20/16   Einar Pheasant, MD    Family History Family History  Problem Relation Age of Onset  . Arthritis Mother   . Hyperlipidemia Mother   . Hypertension Mother   . Cancer Father        prostate  . Hyperlipidemia Father   . Hypertension Father   . Alcohol abuse Maternal Grandfather   . Breast cancer Neg Hx     Social History Social History  Substance Use Topics  . Smoking status: Current Every Day  Smoker    Packs/day: 0.25    Years: 3.00    Types: Cigarettes  . Smokeless tobacco: Never Used     Comment: 2 cigarettes a day  . Alcohol use 0.0 oz/week     Comment: socially     Allergies   Spinach and Ampicillin   Review of Systems Review of Systems  Constitutional: Positive for fatigue.  HENT: Positive for congestion, sinus pain and sore throat.   Respiratory: Positive for cough and wheezing.      Physical Exam Triage Vital Signs ED Triage Vitals  Enc Vitals Group     BP 02/11/17 0953 115/75     Pulse Rate 02/11/17 0953 69     Resp 02/11/17 0953 16     Temp 02/11/17 0953 98.3 F (36.8 C)     Temp Source  02/11/17 0953 Oral     SpO2 02/11/17 0953 97 %     Weight 02/11/17 0951 170 lb (77.1 kg)     Height 02/11/17 0951 5\' 7"  (1.702 m)     Head Circumference --      Peak Flow --      Pain Score 02/11/17 0951 4     Pain Loc --      Pain Edu? --      Excl. in [place]? --    No data found.   Updated Vital Signs BP 115/75 (BP Location: Right Arm)   Pulse 69   Temp 98.3 F (36.8 C) (Oral)   Resp 16   Ht 5\' 7"  (1.702 m)   Wt 170 lb (77.1 kg)   SpO2 97%   BMI 26.63 kg/m   Visual Acuity Right Eye Distance:   Left Eye Distance:   Bilateral Distance:    Right Eye Near:   Left Eye Near:    Bilateral Near:     Physical Exam  Constitutional: She appears well-developed and well-nourished. No distress.  HENT:  Head: Normocephalic and atraumatic.  Right Ear: Tympanic membrane, external ear and ear canal normal.  Left Ear: Tympanic membrane, external ear and ear canal normal.  Nose: Mucosal edema and rhinorrhea present. No nose lacerations, sinus tenderness, nasal deformity, septal deviation or nasal septal hematoma. No epistaxis.  No foreign bodies. Right sinus exhibits maxillary sinus tenderness and frontal sinus tenderness. Left sinus exhibits maxillary sinus tenderness and frontal sinus tenderness.  Mouth/Throat: Uvula is midline, oropharynx is clear and moist and mucous membranes are normal. No oropharyngeal exudate.  Eyes: Conjunctivae and EOM are normal. Pupils are equal, round, and reactive to light. Right eye exhibits no discharge. Left eye exhibits no discharge. No scleral icterus.  Neck: Normal range of motion. Neck supple. No thyromegaly present.  Cardiovascular: Normal rate, regular rhythm and normal heart sounds.   Pulmonary/Chest: Effort normal. No respiratory distress. She has wheezes (few, diffuse; rhonchi). She has no rales.  Lymphadenopathy:    She has no cervical adenopathy.  Skin: She is not diaphoretic.  Nursing note and vitals reviewed.    UC Treatments / Results    Labs (all labs ordered are listed, but only abnormal results are displayed) Labs Reviewed - No data to display  EKG  EKG Interpretation None       Radiology No results found.  Procedures Procedures (including critical care time)  Medications Ordered in UC Medications - No data to display   Initial Impression / Assessment and Plan / UC Course  I have reviewed the triage vital signs and the nursing notes.  Pertinent  labs & imaging results that were available during my care of the patient were reviewed by me and considered in my medical decision making (see chart for details).       Final Clinical Impressions(s) / UC Diagnoses   Final diagnoses:  Cough    New Prescriptions Discharge Medication List as of 02/11/2017 10:23 AM    START taking these medications   Details  chlorpheniramine-HYDROcodone (TUSSIONEX PENNKINETIC ER) 10-8 MG/5ML SUER Take 5 mLs by mouth every 12 (twelve) hours as needed., Starting Tue 02/11/2017, Normal    doxycycline (VIBRA-TABS) 100 MG tablet Take 1 tablet (100 mg total) by mouth 2 (two) times daily., Starting Tue 02/11/2017, Normal    predniSONE (DELTASONE) 20 MG tablet Take 1 tablet (20 mg total) by mouth daily., Starting Tue 02/11/2017, Normal       1. diagnosis reviewed with patient 2. rx as per orders above; reviewed possible side effects, interactions, risks and benefits  3. Recommend supportive treatment with fluids, rest 4. Follow-up prn if symptoms worsen or don't improve   Norval Gable, MD 02/11/17 1045

## 2017-02-11 NOTE — ED Triage Notes (Signed)
Patient c/o cough and chest congestion since Saturday.  Patient c/o runny nose and nasal congestion also.  Patient denies fevers.

## 2017-02-27 ENCOUNTER — Other Ambulatory Visit (HOSPITAL_COMMUNITY)
Admission: RE | Admit: 2017-02-27 | Discharge: 2017-02-27 | Disposition: A | Discharge status: Home / Self Care | Payer: BC Managed Care – PPO | Source: Ambulatory Visit | Attending: Internal Medicine | Admitting: Internal Medicine

## 2017-02-27 ENCOUNTER — Encounter: Payer: Self-pay | Admitting: Internal Medicine

## 2017-02-27 ENCOUNTER — Ambulatory Visit (INDEPENDENT_AMBULATORY_CARE_PROVIDER_SITE_OTHER): Payer: BC Managed Care – PPO | Admitting: Internal Medicine

## 2017-02-27 VITALS — BP 110/62 | HR 82 | Temp 98.4°F | Resp 12 | Ht 65.35 in | Wt 178.8 lb

## 2017-02-27 DIAGNOSIS — Z0000 Encounter for general adult medical examination without abnormal findings: Secondary | ICD-10-CM | POA: Diagnosis not present

## 2017-02-27 DIAGNOSIS — F439 Reaction to severe stress, unspecified: Secondary | ICD-10-CM | POA: Diagnosis not present

## 2017-02-27 DIAGNOSIS — Z86010 Personal history of colonic polyps: Secondary | ICD-10-CM

## 2017-02-27 DIAGNOSIS — Z124 Encounter for screening for malignant neoplasm of cervix: Secondary | ICD-10-CM | POA: Diagnosis not present

## 2017-02-27 DIAGNOSIS — E7800 Pure hypercholesterolemia, unspecified: Secondary | ICD-10-CM

## 2017-02-27 DIAGNOSIS — N939 Abnormal uterine and vaginal bleeding, unspecified: Secondary | ICD-10-CM | POA: Diagnosis not present

## 2017-02-27 NOTE — Assessment & Plan Note (Signed)
Physical today 02/28/17.  PAP 02/28/17.  Mammogram 11/27/2016 - Birads I.  Colonoscopy 10/2016 - one tubular adenomatous polyp in the ascending colon and diverticulosis.

## 2017-02-27 NOTE — Progress Notes (Signed)
Patient ID: Maria Munoz, female   DOB: 04/26/1966, 51 y.o.   MRN: [id number]   Subjective:    Patient ID: Maria Munoz, female    DOB: [birth year], 51 y.o.   MRN: [id number]  HPI  Patient here for her physical exam.  She reports she is doing relatively well.  Handling stress relatively well.  Has been more stressful recently.  Job changed temporarily.  She reports she will soon be back doing her new job.  Handling stress ok.  No chest pain.  No sob.  No acid reflux.  No abdominal pain.  Bowels moving. No urinary change.  She does report that she had an ablation years ago.  No bleeding until the previous two months.  Has had two episodes.  First - one day of bleeding and had to use a tampon.  Second - minimal bleeding on a pad.  No cramping.  No bleeding since.  Each episode occurred several weeks apart.     Past Medical History:  Diagnosis Date  . Arthritis   . Herpes    H/O  . History of abnormal Pap smear   . Hypercholesterolemia    Past Surgical History:  Procedure Laterality Date  . ANTERIOR CRUCIATE LIGAMENT REPAIR Left 2009  . CERVICAL BIOPSY  W/ LOOP ELECTRODE EXCISION    . COLONOSCOPY WITH PROPOFOL N/A 10/18/2016   Procedure: COLONOSCOPY WITH PROPOFOL;  Surgeon: Lollie Sails, MD;  Location: [facility] ENDOSCOPY;  Service: Endoscopy;  Laterality: N/A;  . JOINT REPLACEMENT     RT Knee  . NOVASURE ABLATION    . TOTAL KNEE ARTHROPLASTY Right 05/13/2016   Procedure: RIGHT TOTAL KNEE ARTHROPLASTY;  Surgeon: Gaynelle Arabian, MD;  Location: [facility] ORS;  Service: Orthopedics;  Laterality: Right;  . TYMPANOPLASTY     Family History  Problem Relation Age of Onset  . Arthritis Mother   . Hyperlipidemia Mother   . Hypertension Mother   . Cancer Father        prostate  . Hyperlipidemia Father   . Hypertension Father   . Alcohol abuse Maternal Grandfather   . Breast cancer Neg Hx    Social History   Social History  . Marital status: Married    Spouse name: N/A  . Number of children: N/A   . Years of education: N/A   Social History Main Topics  . Smoking status: Current Every Day Smoker    Packs/day: 0.25    Years: 3.00    Types: Cigarettes  . Smokeless tobacco: Never Used     Comment: 2 cigarettes a day  . Alcohol use 0.0 oz/week     Comment: socially  . Drug use: No  . Sexual activity: Not Asked   Other Topics Concern  . None   Social History Narrative  . None    Outpatient Encounter Prescriptions as of 02/27/2017  Medication Sig  . citalopram (CELEXA) 20 MG tablet Take 1 tablet (20 mg total) by mouth daily.  . valACYclovir (VALTREX) 500 MG tablet Take 1 tablet (500 mg total) by mouth daily.  . [DISCONTINUED] chlorpheniramine-HYDROcodone (TUSSIONEX PENNKINETIC ER) 10-8 MG/5ML SUER Take 5 mLs by mouth every 12 (twelve) hours as needed.  . [DISCONTINUED] doxycycline (VIBRA-TABS) 100 MG tablet Take 1 tablet (100 mg total) by mouth 2 (two) times daily.  . [DISCONTINUED] predniSONE (DELTASONE) 20 MG tablet Take 1 tablet (20 mg total) by mouth daily.   No facility-administered encounter medications on file as of 02/27/2017.  Review of Systems  Constitutional: Negative for appetite change and unexpected weight change.  HENT: Negative for congestion and sinus pressure.   Eyes: Negative for pain and visual disturbance.  Respiratory: Negative for cough, chest tightness and shortness of breath.   Cardiovascular: Negative for chest pain, palpitations and leg swelling.  Gastrointestinal: Negative for abdominal pain, diarrhea, nausea and vomiting.  Genitourinary: Positive for vaginal bleeding. Negative for difficulty urinating and dysuria.  Musculoskeletal: Negative for back pain and joint swelling.  Skin: Negative for color change and rash.  Neurological: Negative for dizziness, light-headedness and headaches.  Hematological: Negative for adenopathy. Does not bruise/bleed easily.  Psychiatric/Behavioral: Negative for agitation and dysphoric mood.         Objective:    Physical Exam  Constitutional: She is oriented to person, place, and time. She appears well-developed and well-nourished. No distress.  HENT:  Nose: Nose normal.  Mouth/Throat: Oropharynx is clear and moist.  Eyes: Right eye exhibits no discharge. Left eye exhibits no discharge. No scleral icterus.  Neck: Neck supple. No thyromegaly present.  Cardiovascular: Normal rate and regular rhythm.   Pulmonary/Chest: Breath sounds normal. No accessory muscle usage. No tachypnea. No respiratory distress. She has no decreased breath sounds. She has no wheezes. She has no rhonchi. Right breast exhibits no inverted nipple, no mass, no nipple discharge and no tenderness (no axillary adenopathy). Left breast exhibits no inverted nipple, no mass, no nipple discharge and no tenderness (no axilarry adenopathy).  Abdominal: Soft. Bowel sounds are normal. There is no tenderness.  Genitourinary:  Genitourinary Comments: Normal external genitalia.  Vaginal vault without lesions.  Cervix identified.  Pap smear performed.  Could not appreciate any adnexal masses or tenderness.    Musculoskeletal: She exhibits no edema or tenderness.  Lymphadenopathy:    She has no cervical adenopathy.  Neurological: She is alert and oriented to person, place, and time.  Skin: Skin is warm. No rash noted. No erythema.  Psychiatric: She has a normal mood and affect. Her behavior is normal.    BP 110/62 (BP Location: Left Arm, Patient Position: Sitting, Cuff Size: Normal)   Pulse 82   Temp 98.4 F (36.9 C) (Oral)   Resp 12   Ht 5' 5.35" (1.66 m)   Wt 178 lb 12 oz (81.1 kg)   LMP 02/03/2017   SpO2 95%   BMI 29.42 kg/m  Wt Readings from Last 3 Encounters:  02/27/17 178 lb 12 oz (81.1 kg)  02/11/17 170 lb (77.1 kg)  10/30/16 177 lb 9.6 oz (80.6 kg)     Lab Results  Component Value Date   WBC 8.4 11/15/2016   HGB 14.5 11/15/2016   HCT 43.1 11/15/2016   PLT 265.0 11/15/2016   GLUCOSE 87 11/15/2016    CHOL 222 (H) 11/15/2016   TRIG 83.0 11/15/2016   HDL 99.00 11/15/2016   LDLDIRECT 104.1 07/23/2013   LDLCALC 106 (H) 11/15/2016   ALT 17 11/15/2016   AST 15 11/15/2016   NA 137 11/15/2016   K 4.1 11/15/2016   CL 102 11/15/2016   CREATININE 0.57 11/15/2016   BUN 11 11/15/2016   CO2 30 11/15/2016   TSH 1.33 04/29/2016   INR 0.84 05/03/2016       Assessment & Plan:   Problem List Items Addressed This Visit    Health care maintenance    Physical today 02/28/17.  PAP 02/28/17.  Mammogram 11/27/2016 - Birads I.  Colonoscopy 10/2016 - one tubular adenomatous polyp in the ascending colon and  diverticulosis.        History of colon polyps    Colonoscopy 10/18/16 - tubular adenoma.        Hypercholesterolemia    Low cholesterol diet and exercise.  Follow lipid panel.        Stress    On citalopram and doing well.  Follow.        Vaginal bleeding    Had an ablation approximately 7 years ago.  Has had no bleeding until the last two months.  One episode - bleeding for one day.  Had to wear a tampon.  Second episode - bleeding.  Wore a pad with minimal blood.  Pelvic and pap today.  Refer to gyn for evaluation.  Not sure if pt has been menopausal since s/p ablation.  Given has been years since period and now with bleeding, feel gyn evaluation warranted.        Relevant Orders   Ambulatory referral to Gynecology    Other Visit Diagnoses    Routine general medical examination at a health care facility    -  Primary   Screening for cervical cancer       Relevant Orders   Cytology - PAP       Einar Pheasant, MD

## 2017-02-27 NOTE — Progress Notes (Signed)
Pre-visit discussion using our clinic review tool. No additional management support is needed unless otherwise documented below in the visit note.  

## 2017-02-27 NOTE — Assessment & Plan Note (Signed)
Had an ablation approximately 7 years ago.  Has had no bleeding until the last two months.  One episode - bleeding for one day.  Had to wear a tampon.  Second episode - bleeding.  Wore a pad with minimal blood.  Pelvic and pap today.  Refer to gyn for evaluation.  Not sure if pt has been menopausal since s/p ablation.  Given has been years since period and now with bleeding, feel gyn evaluation warranted.

## 2017-03-01 ENCOUNTER — Encounter: Payer: Self-pay | Admitting: Internal Medicine

## 2017-03-01 NOTE — Assessment & Plan Note (Signed)
Low cholesterol diet and exercise.  Follow lipid panel.   

## 2017-03-01 NOTE — Assessment & Plan Note (Signed)
On citalopram and doing well.  Follow.

## 2017-03-01 NOTE — Assessment & Plan Note (Signed)
Colonoscopy 10/18/16 - tubular adenoma.

## 2017-03-04 LAB — CYTOLOGY - PAP
DIAGNOSIS: NEGATIVE
HPV: NOT DETECTED

## 2017-03-05 ENCOUNTER — Encounter: Payer: Self-pay | Admitting: Internal Medicine

## 2017-03-07 ENCOUNTER — Other Ambulatory Visit: Payer: Self-pay

## 2017-03-07 MED ORDER — CITALOPRAM HYDROBROMIDE 20 MG PO TABS: 20.0000 mg | ORAL_TABLET | Freq: Every day | ORAL | 3 refills | Status: DC

## 2017-03-21 ENCOUNTER — Other Ambulatory Visit: Payer: Self-pay | Admitting: Internal Medicine

## 2017-06-18 ENCOUNTER — Other Ambulatory Visit: Payer: Self-pay | Admitting: Internal Medicine

## 2017-07-04 ENCOUNTER — Encounter: Payer: Self-pay | Admitting: Internal Medicine

## 2017-07-04 ENCOUNTER — Ambulatory Visit (INDEPENDENT_AMBULATORY_CARE_PROVIDER_SITE_OTHER): Payer: BC Managed Care – PPO | Admitting: Internal Medicine

## 2017-07-04 DIAGNOSIS — N939 Abnormal uterine and vaginal bleeding, unspecified: Secondary | ICD-10-CM

## 2017-07-04 DIAGNOSIS — F439 Reaction to severe stress, unspecified: Secondary | ICD-10-CM

## 2017-07-04 DIAGNOSIS — E7800 Pure hypercholesterolemia, unspecified: Secondary | ICD-10-CM | POA: Diagnosis not present

## 2017-07-04 DIAGNOSIS — G5601 Carpal tunnel syndrome, right upper limb: Secondary | ICD-10-CM

## 2017-07-04 NOTE — Progress Notes (Signed)
Patient ID: Maria Munoz, female   DOB: [birth year], 51 y.o.   MRN: [id number]   Subjective:    Patient ID: Maria Munoz, female    DOB: 03-Dec-1965, 51 y.o.   MRN: [id number]  HPI  Patient here for a scheduled follow up.  Increased stress.  Discussed with her today.  Increased stress with her job.  On citalopram.  Does not feel she needs anything more at this time.  No chest pain.  No sob.  No acid reflux.  No abdominal pain.  Bowels moving.  She recently saw Dr Ouida Sills.  Discussed HRT.  Decided to hold on starting.  Discussed with her today.  She elects to wait on starting HRT.  She is taking otc estroven.  Her main complaint is that of bilateral hand numbness.  Splints have not made a big difference.  Notices first thing in am and with certain positions.     Past Medical History:  Diagnosis Date  . Arthritis   . Herpes    H/O  . History of abnormal Pap smear   . Hypercholesterolemia    Past Surgical History:  Procedure Laterality Date  . ANTERIOR CRUCIATE LIGAMENT REPAIR Left 2009  . CERVICAL BIOPSY  W/ LOOP ELECTRODE EXCISION    . JOINT REPLACEMENT     RT Knee  . NOVASURE ABLATION    . TYMPANOPLASTY     Family History  Problem Relation Age of Onset  . Arthritis Mother   . Hyperlipidemia Mother   . Hypertension Mother   . Cancer Father        prostate  . Hyperlipidemia Father   . Hypertension Father   . Alcohol abuse Maternal Grandfather   . Breast cancer Neg Hx    Social History   Socioeconomic History  . Marital status: Married    Spouse name: None  . Number of children: None  . Years of education: None  . Highest education level: None  Social Needs  . Financial resource strain: None  . Food insecurity - worry: None  . Food insecurity - inability: None  . Transportation needs - medical: None  . Transportation needs - non-medical: None  Occupational History  . None  Tobacco Use  . Smoking status: Current Every Day Smoker    Packs/day: 0.25    Years: 3.00     Pack years: 0.75    Types: Cigarettes  . Smokeless tobacco: Never Used  . Tobacco comment: 2 cigarettes a day  Substance and Sexual Activity  . Alcohol use: Yes    Alcohol/week: 0.0 oz    Comment: socially  . Drug use: No  . Sexual activity: None  Other Topics Concern  . None  Social History Narrative  . None    Outpatient Encounter Medications as of 07/04/2017  Medication Sig  . citalopram (CELEXA) 20 MG tablet Take 1 tablet (20 mg total) by mouth daily.  . valACYclovir (VALTREX) 500 MG tablet Take 1 tablet (500 mg total) by mouth daily.   No facility-administered encounter medications on file as of 07/04/2017.     Review of Systems  Constitutional: Negative for appetite change and unexpected weight change.  HENT: Negative for congestion and sinus pressure.   Respiratory: Negative for cough, chest tightness and shortness of breath.   Cardiovascular: Negative for chest pain, palpitations and leg swelling.  Gastrointestinal: Negative for abdominal pain, diarrhea, nausea and vomiting.  Genitourinary: Negative for difficulty urinating and dysuria.  Musculoskeletal: Negative for back pain and  joint swelling.  Skin: Negative for color change and rash.  Neurological: Negative for dizziness, light-headedness and headaches.  Psychiatric/Behavioral: Negative for agitation.       Increased stress.         Objective:    Physical Exam  Constitutional: She appears well-developed and well-nourished. No distress.  HENT:  Nose: Nose normal.  Mouth/Throat: Oropharynx is clear and moist.  Neck: Neck supple. No thyromegaly present.  Cardiovascular: Normal rate and regular rhythm.  Pulmonary/Chest: Breath sounds normal. No respiratory distress. She has no wheezes.  Abdominal: Soft. Bowel sounds are normal. There is no tenderness.  Musculoskeletal: She exhibits no edema or tenderness.  Positive phalens.  Negative tinels.    Lymphadenopathy:    She has no cervical adenopathy.  Skin:  No rash noted. No erythema.  Psychiatric: She has a normal mood and affect. Her behavior is normal.    BP 110/62 (BP Location: Left Arm, Patient Position: Sitting, Cuff Size: Normal)   Pulse 82   Temp 98.6 F (37 C) (Oral)   Resp 14   Ht 5\' 5"  (1.651 m)   Wt 186 lb 9.6 oz (84.6 kg)   SpO2 95%   BMI 31.05 kg/m  Wt Readings from Last 3 Encounters:  07/04/17 186 lb 9.6 oz (84.6 kg)  02/27/17 178 lb 12 oz (81.1 kg)  02/11/17 170 lb (77.1 kg)     Lab Results  Component Value Date   WBC 8.4 11/15/2016   HGB 14.5 11/15/2016   HCT 43.1 11/15/2016   PLT 265.0 11/15/2016   GLUCOSE 87 11/15/2016   CHOL 222 (H) 11/15/2016   TRIG 83.0 11/15/2016   HDL 99.00 11/15/2016   LDLDIRECT 104.1 07/23/2013   LDLCALC 106 (H) 11/15/2016   ALT 17 11/15/2016   AST 15 11/15/2016   NA 137 11/15/2016   K 4.1 11/15/2016   CL 102 11/15/2016   CREATININE 0.57 11/15/2016   BUN 11 11/15/2016   CO2 30 11/15/2016   TSH 1.33 04/29/2016   INR 0.84 05/03/2016       Assessment & Plan:   Problem List Items Addressed This Visit    CTS (carpal tunnel syndrome)    Bilateral hand numbness as outlined.  Has tried splints.  Persistent.  Schedule NCS.        Relevant Orders   Ambulatory referral to Neurology   Hypercholesterolemia    Low cholesterol diet and exercise.  Follow lipid panel.        Stress    Increased stress.  Discussed with her today.  On citalopram.  Does not feel needs anything more at this time.  Follow.        Vaginal bleeding    Just saw gyn.  Continue f/u with gyn.            Einar Pheasant, MD

## 2017-07-07 ENCOUNTER — Encounter: Payer: Self-pay | Admitting: Internal Medicine

## 2017-07-07 NOTE — Assessment & Plan Note (Signed)
Increased stress.  Discussed with her today.  On citalopram.  Does not feel needs anything more at this time.  Follow.   

## 2017-07-07 NOTE — Assessment & Plan Note (Signed)
Just saw gyn.  Continue f/u with gyn.

## 2017-07-07 NOTE — Assessment & Plan Note (Signed)
Low cholesterol diet and exercise.  Follow lipid panel.   

## 2017-07-07 NOTE — Assessment & Plan Note (Signed)
Bilateral hand numbness as outlined.  Has tried splints.  Persistent.  Schedule NCS.

## 2017-07-18 ENCOUNTER — Other Ambulatory Visit: Payer: Self-pay | Admitting: Internal Medicine

## 2017-08-09 ENCOUNTER — Other Ambulatory Visit: Payer: Self-pay | Admitting: Internal Medicine

## 2017-10-12 ENCOUNTER — Telehealth: Payer: BC Managed Care – PPO | Admitting: Family

## 2017-10-12 DIAGNOSIS — J029 Acute pharyngitis, unspecified: Secondary | ICD-10-CM

## 2017-10-12 MED ORDER — BENZONATATE 100 MG PO CAPS: 100.0000 mg | ORAL_CAPSULE | Freq: Three times a day (TID) | ORAL | 0 refills | Status: DC | PRN

## 2017-10-12 MED ORDER — PREDNISONE 5 MG PO TABS: 5.0000 mg | ORAL_TABLET | ORAL | 0 refills | Status: DC

## 2017-10-12 NOTE — Progress Notes (Signed)
Thank you for the details you included in the comment boxes. Those details are very helpful in determining the best course of treatment for you and help [place] to provide the best care.This is consistent with a virus but not with the flu. See below for your treatment plan.   We are sorry that you are not feeling well.  Here is how we plan to help!  Based on your presentation I believe you most likely have A cough due to a virus.  This is called viral bronchitis and is best treated by rest, plenty of fluids and control of the cough.  You may use Ibuprofen or Tylenol as directed to help your symptoms.     In addition you may use A non-prescription cough medication called Mucinex DM: take 2 tablets every 12 hours. and A prescription cough medication called Tessalon Perles 100mg . You may take 1-2 capsules every 8 hours as needed for your cough.  Sterapred 5 mg dosepak  From your responses in the eVisit questionnaire you describe inflammation in the upper respiratory tract which is causing a significant cough.  This is commonly called Bronchitis and has four common causes:    Allergies  Viral Infections  Acid Reflux  Bacterial Infection Allergies, viruses and acid reflux are treated by controlling symptoms or eliminating the cause. An example might be a cough caused by taking certain blood pressure medications. You stop the cough by changing the medication. Another example might be a cough caused by acid reflux. Controlling the reflux helps control the cough.  USE OF BRONCHODILATOR ("RESCUE") INHALERS: There is a risk from using your bronchodilator too frequently.  The risk is that over-reliance on a medication which only relaxes the muscles surrounding the breathing tubes can reduce the effectiveness of medications prescribed to reduce swelling and congestion of the tubes themselves.  Although you feel brief relief from the bronchodilator inhaler, your asthma may actually be worsening with the tubes  becoming more swollen and filled with mucus.  This can delay other crucial treatments, such as oral steroid medications. If you need to use a bronchodilator inhaler daily, several times per day, you should discuss this with your provider.  There are probably better treatments that could be used to keep your asthma under control.     HOME CARE . Only take medications as instructed by your medical team. . Complete the entire course of an antibiotic. . Drink plenty of fluids and get plenty of rest. . Avoid close contacts especially the very young and the elderly . Cover your mouth if you cough or cough into your sleeve. . Always remember to wash your hands . A steam or ultrasonic humidifier can help congestion.   GET HELP RIGHT AWAY IF: . You develop worsening fever. . You become short of breath . You cough up blood. . Your symptoms persist after you have completed your treatment plan MAKE SURE YOU   Understand these instructions.  Will watch your condition.  Will get help right away if you are not doing well or get worse.  Your e-visit answers were reviewed by a board certified advanced clinical practitioner to complete your personal care plan.  Depending on the condition, your plan could have included both over the counter or prescription medications. If there is a problem please reply  once you have received a response from your provider. Your safety is important to [place].  If you have drug allergies check your prescription carefully.    You can use  MyChart to ask questions about today's visit, request a non-urgent call back, or ask for a work or school excuse for 24 hours related to this e-Visit. If it has been greater than 24 hours you will need to follow up with your provider, or enter a new e-Visit to address those concerns. You will get an e-mail in the next two days asking about your experience.  I hope that your e-visit has been valuable and will speed your recovery. Thank you for  using e-visits.

## 2017-10-23 ENCOUNTER — Other Ambulatory Visit: Payer: Self-pay

## 2017-10-23 MED ORDER — CITALOPRAM HYDROBROMIDE 20 MG PO TABS: 20.0000 mg | ORAL_TABLET | Freq: Every day | ORAL | 0 refills | Status: DC

## 2017-11-03 ENCOUNTER — Ambulatory Visit: Payer: BC Managed Care – PPO | Admitting: Internal Medicine

## 2017-11-03 DIAGNOSIS — Z0289 Encounter for other administrative examinations: Secondary | ICD-10-CM

## 2017-11-11 ENCOUNTER — Other Ambulatory Visit: Payer: Self-pay | Admitting: Internal Medicine

## 2017-12-05 ENCOUNTER — Telehealth: Payer: Self-pay | Admitting: *Deleted

## 2017-12-05 DIAGNOSIS — Z1231 Encounter for screening mammogram for malignant neoplasm of breast: Secondary | ICD-10-CM

## 2017-12-05 NOTE — Telephone Encounter (Signed)
Mammogram ordered placed. Pt to call & schedule appt.

## 2017-12-22 NOTE — Telephone Encounter (Signed)
Unread mychart message mailed to patient 

## 2018-01-12 ENCOUNTER — Other Ambulatory Visit: Payer: Self-pay | Admitting: Internal Medicine

## 2018-01-19 ENCOUNTER — Other Ambulatory Visit: Payer: Self-pay

## 2018-01-19 MED ORDER — VALACYCLOVIR HCL 500 MG PO TABS: 500.0000 mg | ORAL_TABLET | Freq: Every day | ORAL | 0 refills | Status: DC

## 2018-01-29 ENCOUNTER — Encounter: Payer: Self-pay | Admitting: Internal Medicine

## 2018-01-29 ENCOUNTER — Ambulatory Visit: Payer: BC Managed Care – PPO | Admitting: Internal Medicine

## 2018-01-29 ENCOUNTER — Encounter

## 2018-01-29 VITALS — BP 128/78 | HR 81 | Temp 98.7°F | Resp 18 | Wt 189.0 lb

## 2018-01-29 DIAGNOSIS — F439 Reaction to severe stress, unspecified: Secondary | ICD-10-CM | POA: Diagnosis not present

## 2018-01-29 DIAGNOSIS — N6489 Other specified disorders of breast: Secondary | ICD-10-CM

## 2018-01-29 DIAGNOSIS — E7800 Pure hypercholesterolemia, unspecified: Secondary | ICD-10-CM

## 2018-01-29 DIAGNOSIS — G5601 Carpal tunnel syndrome, right upper limb: Secondary | ICD-10-CM

## 2018-01-29 DIAGNOSIS — M542 Cervicalgia: Secondary | ICD-10-CM | POA: Diagnosis not present

## 2018-01-29 NOTE — Progress Notes (Signed)
Patient ID: Maria Munoz, female   DOB: [birth year], 52 y.o.   MRN: [id number]   Subjective:    Patient ID: Maria Munoz, female    DOB: 1966-08-22, 52 y.o.   MRN: [id number]  HPI  Patient here for a scheduled follow up.  She reports increased stress recently.  Increased stress with her job. Discussed with her today.  She feels she is handling things relatively well.  Desires no further intervention.  Want sot monitor.  She is still having problems with her hands.  Numbness and pain.  Had NCS.  Revealed CTS.  Wearing splints.  Still with issues and affecting her grip.  Discussed referral to ortho no chest pain.  No sob.  No acid reflux.  No abdominal pain.  Bowels moving.  Discussed exercise.  She plans to get back in a routine.  Also plans to do some yoga.  Still with back pain.  Feels related to increased size of her breasts.  Adds increased stress to her back.  No vaginal bleeding or vaginal problems.  Taking estroven. Feels like her left breast is bigger.  More fullness.  No nodule.  No nipple discharge.       Past Medical History:  Diagnosis Date  . Arthritis   . Herpes    H/O  . History of abnormal Pap smear   . Hypercholesterolemia    Past Surgical History:  Procedure Laterality Date  . ANTERIOR CRUCIATE LIGAMENT REPAIR Left 2009  . CERVICAL BIOPSY  W/ LOOP ELECTRODE EXCISION    . COLONOSCOPY WITH PROPOFOL N/A 10/18/2016   Procedure: COLONOSCOPY WITH PROPOFOL;  Surgeon: Lollie Sails, MD;  Location: [facility] ENDOSCOPY;  Service: Endoscopy;  Laterality: N/A;  . JOINT REPLACEMENT     RT Knee  . NOVASURE ABLATION    . TOTAL KNEE ARTHROPLASTY Right 05/13/2016   Procedure: RIGHT TOTAL KNEE ARTHROPLASTY;  Surgeon: Gaynelle Arabian, MD;  Location: [facility] ORS;  Service: Orthopedics;  Laterality: Right;  . TYMPANOPLASTY     Family History  Problem Relation Age of Onset  . Arthritis Mother   . Hyperlipidemia Mother   . Hypertension Mother   . Cancer Father        prostate  . Hyperlipidemia  Father   . Hypertension Father   . Alcohol abuse Maternal Grandfather   . Breast cancer Neg Hx    Social History   Socioeconomic History  . Marital status: Divorced    Spouse name: Not on file  . Number of children: Not on file  . Years of education: Not on file  . Highest education level: Not on file  Occupational History  . Not on file  Social Needs  . Financial resource strain: Not on file  . Food insecurity:    Worry: Not on file    Inability: Not on file  . Transportation needs:    Medical: Not on file    Non-medical: Not on file  Tobacco Use  . Smoking status: Current Every Day Smoker    Packs/day: 0.25    Years: 3.00    Pack years: 0.75    Types: Cigarettes  . Smokeless tobacco: Never Used  . Tobacco comment: 2 cigarettes a day  Substance and Sexual Activity  . Alcohol use: Yes    Alcohol/week: 0.0 oz    Comment: socially  . Drug use: No  . Sexual activity: Not on file  Lifestyle  . Physical activity:    Days per week: Not on file  Minutes per session: Not on file  . Stress: Not on file  Relationships  . Social connections:    Talks on phone: Not on file    Gets together: Not on file    Attends religious service: Not on file    Active member of club or organization: Not on file    Attends meetings of clubs or organizations: Not on file    Relationship status: Not on file  Other Topics Concern  . Not on file  Social History Narrative  . Not on file    Outpatient Encounter Medications as of 01/29/2018  Medication Sig  . citalopram (CELEXA) 20 MG tablet Take 1 tablet (20 mg total) by mouth daily.  . valACYclovir (VALTREX) 500 MG tablet Take 1 tablet (500 mg total) by mouth daily.   No facility-administered encounter medications on file as of 01/29/2018.     Review of Systems  Constitutional: Negative for appetite change and unexpected weight change.  HENT: Negative for congestion and sinus pressure.   Respiratory: Negative for cough, chest  tightness and shortness of breath.   Cardiovascular: Negative for chest pain, palpitations and leg swelling.  Gastrointestinal: Negative for abdominal pain, diarrhea, nausea and vomiting.  Genitourinary: Negative for difficulty urinating and dysuria.  Musculoskeletal: Positive for back pain. Negative for joint swelling and myalgias.       Hand pain and numbness as outlined.    Skin: Negative for color change and rash.  Neurological: Negative for dizziness, light-headedness and headaches.  Psychiatric/Behavioral: Negative for agitation and dysphoric mood.       Increased stress as outlined.         Objective:    Physical Exam  Constitutional: She appears well-developed and well-nourished.  HENT:  Nose: Nose normal.  Mouth/Throat: Oropharynx is clear and moist.  Neck: Neck supple. No thyromegaly present.  Cardiovascular: Normal rate and regular rhythm.  Pulmonary/Chest: Breath sounds normal. No respiratory distress. She has no wheezes.  Breasts:  No nipple discharge or nipple retraction present.  Some left breast fullness.  No palpable nodules. No axillary adenopathy.    Abdominal: Soft. Bowel sounds are normal. There is no tenderness.  Musculoskeletal: She exhibits no edema or tenderness.  Lymphadenopathy:    She has no cervical adenopathy.  Skin: No rash noted. No erythema.  Psychiatric: She has a normal mood and affect. Her behavior is normal.    BP 128/78 (BP Location: Left Arm, Patient Position: Sitting, Cuff Size: Normal)   Pulse 81   Temp 98.7 F (37.1 C) (Oral)   Resp 18   Wt 189 lb (85.7 kg)   SpO2 97%   BMI 31.45 kg/m  Wt Readings from Last 3 Encounters:  01/29/18 189 lb (85.7 kg)  07/04/17 186 lb 9.6 oz (84.6 kg)  02/27/17 178 lb 12 oz (81.1 kg)     Lab Results  Component Value Date   WBC 8.4 11/15/2016   HGB 14.5 11/15/2016   HCT 43.1 11/15/2016   PLT 265.0 11/15/2016   GLUCOSE 87 11/15/2016   CHOL 222 (H) 11/15/2016   TRIG 83.0 11/15/2016   HDL  99.00 11/15/2016   LDLDIRECT 104.1 07/23/2013   LDLCALC 106 (H) 11/15/2016   ALT 17 11/15/2016   AST 15 11/15/2016   NA 137 11/15/2016   K 4.1 11/15/2016   CL 102 11/15/2016   CREATININE 0.57 11/15/2016   BUN 11 11/15/2016   CO2 30 11/15/2016   TSH 1.33 04/29/2016   INR 0.84 05/03/2016  Assessment & Plan:   Problem List Items Addressed This Visit    CTS (carpal tunnel syndrome)    NCS obtained and c/w CTS.  Still with issues despite splints.  Refer to ortho.        Relevant Orders   Ambulatory referral to Orthopedic Surgery   Hypercholesterolemia    Low cholesterol diet and exercise.  Follow lipid panel.        Relevant Orders   CBC with Differential/Platelet   Hepatic function panel   Lipid panel   Basic metabolic panel   TSH   Neck pain    With neck/shoulder and back pain.  Large pendulous breasts.  Aggravating.  Previously saw Dr Sandi Mariscal.  Still an issue.  Follow.        Stress    Increased stress as outlined.  Discussed with her today.  On citalopram.  Does not feel needs anything more at this time.  Follow.         Other Visit Diagnoses    Fullness of breast    -  Primary   Fullness in breast as outlined.  Schedule diagnostic mammogram.  Due for screening.    Relevant Orders   MM DIAG BREAST TOMO BILATERAL   US BREAST LTD UNI LEFT INC AXILLA   US BREAST LTD UNI RIGHT INC Noreene Filbert, MD

## 2018-02-01 ENCOUNTER — Encounter: Payer: Self-pay | Admitting: Internal Medicine

## 2018-02-01 NOTE — Assessment & Plan Note (Signed)
NCS obtained and c/w CTS.  Still with issues despite splints.  Refer to ortho.

## 2018-02-01 NOTE — Assessment & Plan Note (Signed)
Low cholesterol diet and exercise.  Follow lipid panel.   

## 2018-02-01 NOTE — Assessment & Plan Note (Signed)
With neck/shoulder and back pain.  Large pendulous breasts.  Aggravating.  Previously saw Dr Sandi Mariscal.  Still an issue.  Follow.

## 2018-02-01 NOTE — Assessment & Plan Note (Signed)
Increased stress as outlined.  Discussed with her today.  On citalopram.  Does not feel needs anything more at this time.  Follow.

## 2018-02-04 ENCOUNTER — Other Ambulatory Visit (INDEPENDENT_AMBULATORY_CARE_PROVIDER_SITE_OTHER): Payer: BC Managed Care – PPO

## 2018-02-04 DIAGNOSIS — E7800 Pure hypercholesterolemia, unspecified: Secondary | ICD-10-CM

## 2018-02-04 LAB — CBC WITH DIFFERENTIAL/PLATELET
BASOS ABS: 0.1 10*3/uL (ref 0.0–0.1)
Basophils Relative: 1 % (ref 0.0–3.0)
Eosinophils Absolute: 0.2 10*3/uL (ref 0.0–0.7)
Eosinophils Relative: 2.6 % (ref 0.0–5.0)
HCT: 42.3 % (ref 36.0–46.0)
Hemoglobin: 14.6 g/dL (ref 12.0–15.0)
LYMPHS ABS: 2.6 10*3/uL (ref 0.7–4.0)
Lymphocytes Relative: 35.8 % (ref 12.0–46.0)
MCHC: 34.6 g/dL (ref 30.0–36.0)
MCV: 95.2 fl (ref 78.0–100.0)
MONOS PCT: 7.1 % (ref 3.0–12.0)
Monocytes Absolute: 0.5 10*3/uL (ref 0.1–1.0)
NEUTROS PCT: 53.5 % (ref 43.0–77.0)
Neutro Abs: 3.8 10*3/uL (ref 1.4–7.7)
Platelets: 238 10*3/uL (ref 150.0–400.0)
RBC: 4.44 Mil/uL (ref 3.87–5.11)
RDW: 13.2 % (ref 11.5–15.5)
WBC: 7.1 10*3/uL (ref 4.0–10.5)

## 2018-02-04 LAB — LIPID PANEL
CHOL/HDL RATIO: 3
Cholesterol: 221 mg/dL — ABNORMAL HIGH (ref 0–200)
HDL: 83.9 mg/dL (ref >39.00)
LDL Cholesterol: 112 mg/dL — ABNORMAL HIGH (ref 0–99)
NonHDL: 136.67
TRIGLYCERIDES: 121 mg/dL (ref 0.0–149.0)
VLDL: 24.2 mg/dL (ref 0.0–40.0)

## 2018-02-04 LAB — BASIC METABOLIC PANEL
BUN: 17 mg/dL (ref 6–23)
CALCIUM: 9.3 mg/dL (ref 8.4–10.5)
CO2: 29 meq/L (ref 19–32)
Chloride: 104 mEq/L (ref 96–112)
Creatinine, Ser: 0.56 mg/dL (ref 0.40–1.20)
GFR: 120.25 mL/min (ref >60.00)
Glucose, Bld: 101 mg/dL — ABNORMAL HIGH (ref 70–99)
POTASSIUM: 4 meq/L (ref 3.5–5.1)
SODIUM: 141 meq/L (ref 135–145)

## 2018-02-04 LAB — HEPATIC FUNCTION PANEL
ALBUMIN: 4.3 g/dL (ref 3.5–5.2)
ALK PHOS: 68 U/L (ref 39–117)
ALT: 19 U/L (ref 0–35)
AST: 15 U/L (ref 0–37)
Bilirubin, Direct: 0.1 mg/dL (ref 0.0–0.3)
TOTAL PROTEIN: 6.9 g/dL (ref 6.0–8.3)
Total Bilirubin: 0.6 mg/dL (ref 0.2–1.2)

## 2018-02-04 LAB — TSH: TSH: 1.65 u[IU]/mL (ref 0.35–4.50)

## 2018-02-05 ENCOUNTER — Encounter: Payer: Self-pay | Admitting: Internal Medicine

## 2018-02-06 ENCOUNTER — Ambulatory Visit
Admission: RE | Admit: 2018-02-06 | Discharge: 2018-02-06 | Disposition: A | Discharge status: Home / Self Care | Payer: BC Managed Care – PPO | Source: Ambulatory Visit | Attending: Internal Medicine | Admitting: Internal Medicine

## 2018-02-06 DIAGNOSIS — N6489 Other specified disorders of breast: Secondary | ICD-10-CM

## 2018-02-10 ENCOUNTER — Other Ambulatory Visit: Payer: Self-pay | Admitting: Internal Medicine

## 2018-02-10 NOTE — Progress Notes (Signed)
Opened in error

## 2018-02-11 ENCOUNTER — Telehealth: Payer: Self-pay | Admitting: Internal Medicine

## 2018-02-11 NOTE — Telephone Encounter (Unsigned)
Copied from [place] [id number]. Topic: General - Other >> Feb 11, 2018  4:45 PM Yvette Rack wrote: Reason for CRM: Pt returned call regarding lab results. Cb# [phone]

## 2018-02-12 NOTE — Telephone Encounter (Signed)
Left message for patient to call back- clarifying question for Dr Nicki Reaper on MM result in result basket- see note.

## 2018-03-12 ENCOUNTER — Other Ambulatory Visit: Payer: Self-pay | Admitting: Internal Medicine

## 2018-03-13 MED ORDER — VALACYCLOVIR HCL 500 MG PO TABS: 500.0000 mg | ORAL_TABLET | Freq: Every day | ORAL | 0 refills | Status: DC

## 2018-04-11 ENCOUNTER — Other Ambulatory Visit: Payer: Self-pay | Admitting: Internal Medicine

## 2018-04-23 ENCOUNTER — Other Ambulatory Visit: Payer: Self-pay | Admitting: Internal Medicine

## 2018-05-06 ENCOUNTER — Other Ambulatory Visit: Payer: Self-pay | Admitting: Internal Medicine

## 2018-05-06 MED ORDER — CITALOPRAM HYDROBROMIDE 20 MG PO TABS: 20.0000 mg | ORAL_TABLET | Freq: Every day | ORAL | 2 refills | Status: DC

## 2018-05-06 MED ORDER — VALACYCLOVIR HCL 500 MG PO TABS: 500.0000 mg | ORAL_TABLET | Freq: Every day | ORAL | 2 refills | Status: DC

## 2018-05-28 ENCOUNTER — Ambulatory Visit (INDEPENDENT_AMBULATORY_CARE_PROVIDER_SITE_OTHER): Payer: BC Managed Care – PPO | Admitting: Internal Medicine

## 2018-05-28 ENCOUNTER — Other Ambulatory Visit (HOSPITAL_COMMUNITY)
Admission: RE | Admit: 2018-05-28 | Discharge: 2018-05-28 | Disposition: A | Discharge status: Home / Self Care | Payer: BC Managed Care – PPO | Source: Ambulatory Visit | Attending: Internal Medicine | Admitting: Internal Medicine

## 2018-05-28 ENCOUNTER — Encounter: Payer: Self-pay | Admitting: Internal Medicine

## 2018-05-28 VITALS — BP 118/68 | HR 77 | Temp 97.5°F | Resp 18 | Ht 65.0 in | Wt 191.4 lb

## 2018-05-28 DIAGNOSIS — M542 Cervicalgia: Secondary | ICD-10-CM

## 2018-05-28 DIAGNOSIS — Z23 Encounter for immunization: Secondary | ICD-10-CM

## 2018-05-28 DIAGNOSIS — Z0000 Encounter for general adult medical examination without abnormal findings: Secondary | ICD-10-CM | POA: Diagnosis not present

## 2018-05-28 DIAGNOSIS — R079 Chest pain, unspecified: Secondary | ICD-10-CM

## 2018-05-28 DIAGNOSIS — Z124 Encounter for screening for malignant neoplasm of cervix: Secondary | ICD-10-CM | POA: Insufficient documentation

## 2018-05-28 DIAGNOSIS — E7800 Pure hypercholesterolemia, unspecified: Secondary | ICD-10-CM

## 2018-05-28 DIAGNOSIS — R87619 Unspecified abnormal cytological findings in specimens from cervix uteri: Secondary | ICD-10-CM

## 2018-05-28 DIAGNOSIS — F439 Reaction to severe stress, unspecified: Secondary | ICD-10-CM

## 2018-05-28 DIAGNOSIS — N6489 Other specified disorders of breast: Secondary | ICD-10-CM

## 2018-05-28 NOTE — Progress Notes (Signed)
Patient ID: Maria Munoz, female   DOB: [birth year], 52 y.o.   MRN: [id number]   Subjective:    Patient ID: Maria Munoz, female    DOB: 10/21/1965, 52 y.o.   MRN: [id number]  HPI  Patient here for her physical exam.  She reports she is doing relatively well.  Has been under increased stress with her job.  Plans to retire 09/02/18.  Tries to stay active.  Not exercising regularly.  States had episode where her chest did not feel right.  She feels could have been related to increased stress.  Resolved without intervention.  No pain or tightness now.  No acid reflux.  Breathing overall stable.  No abdominal pain.  Bowels moving.  Still with discomfort related to her breast.  Pulls on her neck and upper shoulders.  Just had mammogram - ok.  Desires to discuss breast reduction.     Past Medical History:  Diagnosis Date  . Arthritis   . Herpes    H/O  . History of abnormal Pap smear   . Hypercholesterolemia    Past Surgical History:  Procedure Laterality Date  . ANTERIOR CRUCIATE LIGAMENT REPAIR Left 2009  . CERVICAL BIOPSY  W/ LOOP ELECTRODE EXCISION    . COLONOSCOPY WITH PROPOFOL N/A 10/18/2016   Procedure: COLONOSCOPY WITH PROPOFOL;  Surgeon: Lollie Sails, MD;  Location: [facility] ENDOSCOPY;  Service: Endoscopy;  Laterality: N/A;  . JOINT REPLACEMENT     RT Knee  . NOVASURE ABLATION    . TOTAL KNEE ARTHROPLASTY Right 05/13/2016   Procedure: RIGHT TOTAL KNEE ARTHROPLASTY;  Surgeon: Gaynelle Arabian, MD;  Location: [facility] ORS;  Service: Orthopedics;  Laterality: Right;  . TYMPANOPLASTY     Family History  Problem Relation Age of Onset  . Arthritis Mother   . Hyperlipidemia Mother   . Hypertension Mother   . Cancer Father        prostate  . Hyperlipidemia Father   . Hypertension Father   . Alcohol abuse Maternal Grandfather   . Breast cancer Neg Hx    Social History   Socioeconomic History  . Marital status: Divorced    Spouse name: Not on file  . Number of children: Not on file  .  Years of education: Not on file  . Highest education level: Not on file  Occupational History  . Not on file  Social Needs  . Financial resource strain: Not on file  . Food insecurity:    Worry: Not on file    Inability: Not on file  . Transportation needs:    Medical: Not on file    Non-medical: Not on file  Tobacco Use  . Smoking status: Current Every Day Smoker    Packs/day: 0.25    Years: 3.00    Pack years: 0.75    Types: Cigarettes  . Smokeless tobacco: Never Used  . Tobacco comment: 2 cigarettes a day  Substance and Sexual Activity  . Alcohol use: Yes    Alcohol/week: 0.0 standard drinks    Comment: socially  . Drug use: No  . Sexual activity: Not on file  Lifestyle  . Physical activity:    Days per week: Not on file    Minutes per session: Not on file  . Stress: Not on file  Relationships  . Social connections:    Talks on phone: Not on file    Gets together: Not on file    Attends religious service: Not on file  Active member of club or organization: Not on file    Attends meetings of clubs or organizations: Not on file    Relationship status: Not on file  Other Topics Concern  . Not on file  Social History Narrative  . Not on file    Outpatient Encounter Medications as of 05/28/2018  Medication Sig  . citalopram (CELEXA) 20 MG tablet Take 1 tablet (20 mg total) by mouth daily.  . valACYclovir (VALTREX) 500 MG tablet Take 1 tablet (500 mg total) by mouth daily.  . [DISCONTINUED] valACYclovir (VALTREX) 500 MG tablet Take 1 tablet (500 mg total) by mouth daily.   No facility-administered encounter medications on file as of 05/28/2018.     Review of Systems  Constitutional: Negative for appetite change and unexpected weight change.  HENT: Negative for congestion and sinus pressure.   Eyes: Negative for pain and visual disturbance.  Respiratory: Negative for cough, chest tightness and shortness of breath.   Cardiovascular: Negative for palpitations and  leg swelling.       Previous chest discomfort as outlined.    Gastrointestinal: Negative for abdominal pain, diarrhea, nausea and vomiting.  Genitourinary: Negative for difficulty urinating and dysuria.  Musculoskeletal: Negative for joint swelling and myalgias.  Skin: Negative for color change and rash.  Neurological: Negative for dizziness, light-headedness and headaches.  Hematological: Negative for adenopathy. Does not bruise/bleed easily.  Psychiatric/Behavioral: Negative for agitation and dysphoric mood.       Increased stress as outlined.         Objective:    Physical Exam  Constitutional: She is oriented to person, place, and time. She appears well-developed and well-nourished. No distress.  HENT:  Nose: Nose normal.  Mouth/Throat: Oropharynx is clear and moist.  Eyes: Right eye exhibits no discharge. Left eye exhibits no discharge. No scleral icterus.  Neck: Neck supple. No thyromegaly present.  Cardiovascular: Normal rate and regular rhythm.  Pulmonary/Chest: Breath sounds normal. No accessory muscle usage. No tachypnea. No respiratory distress. She has no decreased breath sounds. She has no wheezes. She has no rhonchi. Right breast exhibits no inverted nipple, no mass, no nipple discharge and no tenderness (no axillary adenopathy). Left breast exhibits no inverted nipple, no mass, no nipple discharge and no tenderness (no axilarry adenopathy).  Abdominal: Soft. Bowel sounds are normal. There is no tenderness.  Genitourinary:  Genitourinary Comments: Normal external genitalia.  Vaginal vault without lesions.  Cervix identified.  Pap smear performed.  Could not appreciate any adnexal masses or tenderness.    Musculoskeletal: She exhibits no edema or tenderness.  Lymphadenopathy:    She has no cervical adenopathy.  Neurological: She is alert and oriented to person, place, and time.  Skin: No rash noted. No erythema.  Psychiatric: She has a normal mood and affect. Her behavior  is normal.    BP 118/68 (BP Location: Left Arm, Patient Position: Sitting, Cuff Size: Normal)   Pulse 77   Temp (!) 97.5 F (36.4 C) (Oral)   Resp 18   Ht 5\' 5"  (1.651 m)   Wt 191 lb 6.4 oz (86.8 kg)   SpO2 96%   BMI 31.85 kg/m  Wt Readings from Last 3 Encounters:  05/28/18 191 lb 6.4 oz (86.8 kg)  01/29/18 189 lb (85.7 kg)  07/04/17 186 lb 9.6 oz (84.6 kg)     Lab Results  Component Value Date   WBC 7.1 02/04/2018   HGB 14.6 02/04/2018   HCT 42.3 02/04/2018   PLT 238.0 02/04/2018  GLUCOSE 101 (H) 02/04/2018   CHOL 221 (H) 02/04/2018   TRIG 121.0 02/04/2018   HDL 83.90 02/04/2018   LDLDIRECT 104.1 07/23/2013   LDLCALC 112 (H) 02/04/2018   ALT 19 02/04/2018   AST 15 02/04/2018   NA 141 02/04/2018   K 4.0 02/04/2018   CL 104 02/04/2018   CREATININE 0.56 02/04/2018   BUN 17 02/04/2018   CO2 29 02/04/2018   TSH 1.65 02/04/2018   INR 0.84 05/03/2016    Mm Diag Breast Tomo Bilateral  Result Date: 02/06/2018 CLINICAL DATA:  52 year old female with LEFT breast fullness for several months. Also for annual bilateral mammograms. EXAM: DIGITAL DIAGNOSTIC BILATERAL MAMMOGRAM WITH CAD AND TOMO COMPARISON:  Previous exam(s). ACR Breast Density Category b: There are scattered areas of fibroglandular density. FINDINGS: 2D/3D full field views of both breasts demonstrate no suspicious mass, distortion or worrisome calcifications. No findings suggestive of malignancy bilaterally. Mammographic images were processed with CAD. IMPRESSION: No mammographic evidence of breast malignancy. RECOMMENDATION: Bilateral screening mammograms in 1 year. I have discussed the findings and recommendations with the patient. Results were also provided in writing at the conclusion of the visit. If applicable, a reminder letter will be sent to the patient regarding the next appointment. BI-RADS CATEGORY  1: Negative. Electronically Signed   By: Margarette Canada M.D.   On: 02/06/2018 10:24       Assessment & Plan:    Problem List Items Addressed This Visit    Abnormal Pap smear of cervix    Repeat pap today.       Health care maintenance    Physical today 05/28/18.  PAP today 05/28/18.  Mammogram 02/06/18 - Birads I.  Colonoscopy 10/2016 - one tubular adenomatous polyp in the ascending colon and diverticulosis.        Hypercholesterolemia    Low cholesterol diet and exercise.  Follow lipid panel.        Neck pain    Has problems with neck/shoulder and back pain.  Large pendulous breasts.  Aggravating.  Previously saw Dr Sandi Mariscal.  Request referral back for evaluation.        Pendulous breast    Aggravating her neck/shoulders and back.  Request referral back to Dr Sandi Mariscal.        Relevant Orders   Ambulatory referral to Plastic Surgery   Stress    Increased stress.  Planning to retire 09/02/2018.  On citalopram.  Follow.  Has good support.         Other Visit Diagnoses    Cervical cancer screening    -  Primary   Relevant Orders   Cytology - PAP   Need for influenza vaccination       Relevant Orders   Flu Vaccine QUAD 6+ mos PF IM (Fluarix Quad PF) (Completed)   Chest pain, unspecified type       Chest pain as outlined.  EKG - SR with no acute ischemic changes.  Discussed referral to cardiology.     Relevant Orders   EKG 12-Lead (Completed)       Einar Pheasant, MD

## 2018-05-28 NOTE — Assessment & Plan Note (Signed)
Physical today 05/28/18.  PAP today 05/28/18.  Mammogram 02/06/18 - Birads I.  Colonoscopy 10/2016 - one tubular adenomatous polyp in the ascending colon and diverticulosis.

## 2018-05-31 ENCOUNTER — Encounter: Payer: Self-pay | Admitting: Internal Medicine

## 2018-05-31 DIAGNOSIS — N6489 Other specified disorders of breast: Secondary | ICD-10-CM | POA: Insufficient documentation

## 2018-05-31 NOTE — Assessment & Plan Note (Signed)
Repeat pap today

## 2018-05-31 NOTE — Assessment & Plan Note (Signed)
Low cholesterol diet and exercise.  Follow lipid panel.   

## 2018-05-31 NOTE — Assessment & Plan Note (Signed)
Aggravating her neck/shoulders and back.  Request referral back to Dr Sandi Mariscal.

## 2018-05-31 NOTE — Assessment & Plan Note (Signed)
Has problems with neck/shoulder and back pain.  Large pendulous breasts.  Aggravating.  Previously saw Dr Sandi Mariscal.  Request referral back for evaluation.

## 2018-05-31 NOTE — Assessment & Plan Note (Signed)
Increased stress.  Planning to retire 09/02/2018.  On citalopram.  Follow.  Has good support.

## 2018-06-02 LAB — CYTOLOGY - PAP
DIAGNOSIS: NEGATIVE
HPV: NOT DETECTED

## 2018-06-03 ENCOUNTER — Encounter: Payer: Self-pay | Admitting: Internal Medicine

## 2018-08-31 ENCOUNTER — Encounter: Payer: Self-pay | Admitting: Internal Medicine

## 2018-08-31 ENCOUNTER — Other Ambulatory Visit: Payer: Self-pay | Admitting: Internal Medicine

## 2018-09-15 ENCOUNTER — Other Ambulatory Visit: Payer: Self-pay | Admitting: Internal Medicine

## 2018-09-15 ENCOUNTER — Telehealth: Payer: Self-pay | Admitting: Radiology

## 2018-09-15 DIAGNOSIS — E7800 Pure hypercholesterolemia, unspecified: Secondary | ICD-10-CM

## 2018-09-15 NOTE — Progress Notes (Signed)
Orders placed for f/u labs.  

## 2018-09-15 NOTE — Telephone Encounter (Signed)
Pt is coming in for labs tomorrow, please place future orders. Thank you.

## 2018-09-15 NOTE — Telephone Encounter (Signed)
Orders placed for f/u labs.  

## 2018-09-16 ENCOUNTER — Other Ambulatory Visit: Payer: BC Managed Care – PPO

## 2018-09-18 ENCOUNTER — Ambulatory Visit: Payer: BC Managed Care – PPO | Admitting: Internal Medicine

## 2018-10-01 ENCOUNTER — Other Ambulatory Visit: Payer: Self-pay | Admitting: Internal Medicine

## 2018-10-31 ENCOUNTER — Other Ambulatory Visit: Payer: Self-pay | Admitting: Internal Medicine

## 2018-11-15 ENCOUNTER — Other Ambulatory Visit: Payer: Self-pay | Admitting: Internal Medicine

## 2018-11-16 MED ORDER — VALACYCLOVIR HCL 500 MG PO TABS: 500.0000 mg | ORAL_TABLET | Freq: Every day | ORAL | 0 refills | Status: DC

## 2018-11-16 MED ORDER — CITALOPRAM HYDROBROMIDE 20 MG PO TABS: 20.0000 mg | ORAL_TABLET | Freq: Every day | ORAL | 0 refills | Status: DC

## 2018-11-18 ENCOUNTER — Encounter: Payer: Self-pay | Admitting: Internal Medicine

## 2018-11-19 NOTE — Telephone Encounter (Signed)
Melissa, this is the pt that I was discussing with you this am.  Her daughter sent this my chart message regarding the MD she prefers not to see, but will see anyone else.  Thanks

## 2018-12-01 ENCOUNTER — Encounter: Payer: Self-pay | Admitting: Internal Medicine

## 2018-12-14 ENCOUNTER — Other Ambulatory Visit: Payer: Self-pay

## 2018-12-14 ENCOUNTER — Other Ambulatory Visit (INDEPENDENT_AMBULATORY_CARE_PROVIDER_SITE_OTHER): Payer: BC Managed Care – PPO

## 2018-12-14 DIAGNOSIS — E7800 Pure hypercholesterolemia, unspecified: Secondary | ICD-10-CM | POA: Diagnosis not present

## 2018-12-14 LAB — LIPID PANEL
Cholesterol: 228 mg/dL — ABNORMAL HIGH (ref 0–200)
HDL: 80 mg/dL (ref >39.00)
LDL Cholesterol: 125 mg/dL — ABNORMAL HIGH (ref 0–99)
NonHDL: 147.77
Total CHOL/HDL Ratio: 3
Triglycerides: 116 mg/dL (ref 0.0–149.0)
VLDL: 23.2 mg/dL (ref 0.0–40.0)

## 2018-12-14 LAB — HEPATIC FUNCTION PANEL
ALT: 17 U/L (ref 0–35)
AST: 16 U/L (ref 0–37)
Albumin: 4.2 g/dL (ref 3.5–5.2)
Alkaline Phosphatase: 104 U/L (ref 39–117)
Bilirubin, Direct: 0.1 mg/dL (ref 0.0–0.3)
Total Bilirubin: 0.6 mg/dL (ref 0.2–1.2)
Total Protein: 6.5 g/dL (ref 6.0–8.3)

## 2018-12-14 LAB — BASIC METABOLIC PANEL
BUN: 12 mg/dL (ref 6–23)
CO2: 30 mEq/L (ref 19–32)
Calcium: 9.3 mg/dL (ref 8.4–10.5)
Chloride: 104 mEq/L (ref 96–112)
Creatinine, Ser: 0.55 mg/dL (ref 0.40–1.20)
GFR: 115.14 mL/min (ref >60.00)
Glucose, Bld: 86 mg/dL (ref 70–99)
Potassium: 4.2 mEq/L (ref 3.5–5.1)
Sodium: 142 mEq/L (ref 135–145)

## 2018-12-17 ENCOUNTER — Ambulatory Visit (INDEPENDENT_AMBULATORY_CARE_PROVIDER_SITE_OTHER): Payer: BC Managed Care – PPO | Admitting: Internal Medicine

## 2018-12-17 ENCOUNTER — Encounter: Payer: Self-pay | Admitting: Internal Medicine

## 2018-12-17 DIAGNOSIS — E7800 Pure hypercholesterolemia, unspecified: Secondary | ICD-10-CM

## 2018-12-17 DIAGNOSIS — Z87898 Personal history of other specified conditions: Secondary | ICD-10-CM

## 2018-12-17 DIAGNOSIS — N6489 Other specified disorders of breast: Secondary | ICD-10-CM | POA: Diagnosis not present

## 2018-12-17 DIAGNOSIS — F439 Reaction to severe stress, unspecified: Secondary | ICD-10-CM

## 2018-12-17 DIAGNOSIS — Z720 Tobacco use: Secondary | ICD-10-CM | POA: Diagnosis not present

## 2018-12-17 MED ORDER — VALACYCLOVIR HCL 500 MG PO TABS: 500.0000 mg | ORAL_TABLET | Freq: Every day | ORAL | 1 refills | Status: DC

## 2018-12-17 MED ORDER — CITALOPRAM HYDROBROMIDE 20 MG PO TABS: 20.0000 mg | ORAL_TABLET | Freq: Every day | ORAL | 1 refills | Status: DC

## 2018-12-17 NOTE — Progress Notes (Addendum)
Patient ID: Maria Munoz, female   DOB: 27-Jul-1966, 53 y.o.   MRN: [id number] Virtual Visit via Video:  Note  This visit type was conducted due to national recommendations for restrictions regarding the COVID-19 pandemic (e.g. social distancing).  This format is felt to be most appropriate for this patient at this time.  All issues noted in this document were discussed and addressed.  No physical exam was performed (except for noted visual exam findings with Video Visits).   I connected with Maria Munoz on 12/17/18 at  8:00 AM EDT by a video enabled telemedicine application.  Verified that I am speaking with the correct person using two identifiers. Location patient: home Location provider: work Persons participating in the virtual visit: patient, provider  I discussed the limitations, risks, security and privacy concerns of performing an evaluation and management service by video and the availability of in person appointments.. The patient expressed understanding and agreed to proceed.   Reason for visit: scheduled follow up.    HPI: She is doing well.  Retired in 09/2018.  Decreased stress since she retired.  Family doing well.  Has started weight watchers.  adjusted her diet.  Lost weight.  Is exercising and staying physically active.  Has lost 6 pounds since starting at the beginning of March.  No chest pain with increased activity or exertion.  She had previously noticed chest pain.  EKG last visit - ok.  Had discussed cardiology appt. Discussed again with her today.  She wants to hold at this time on referral, since having no symptoms.  No sob.  No cough or congestion.  No known COVID exposures.  No acid reflux.  No abdominal pain.  Bowels moving.  She is still smoking.  Smokes 2 cigarettes per day.  Discussed the need to quit.  Discussed labs.  Discussed calculated cholesterol risk - 3.1%.     ROS: See pertinent positives and negatives per HPI.  Past Medical History:  Diagnosis Date  .  Arthritis   . Herpes    H/O  . History of abnormal Pap smear   . Hypercholesterolemia     Past Surgical History:  Procedure Laterality Date  . ANTERIOR CRUCIATE LIGAMENT REPAIR Left 2009  . CERVICAL BIOPSY  W/ LOOP ELECTRODE EXCISION    . COLONOSCOPY WITH PROPOFOL N/A 10/18/2016   Procedure: COLONOSCOPY WITH PROPOFOL;  Surgeon: Lollie Sails, MD;  Location: [facility] ENDOSCOPY;  Service: Endoscopy;  Laterality: N/A;  . JOINT REPLACEMENT     RT Knee  . NOVASURE ABLATION    . TOTAL KNEE ARTHROPLASTY Right 05/13/2016   Procedure: RIGHT TOTAL KNEE ARTHROPLASTY;  Surgeon: Gaynelle Arabian, MD;  Location: [facility] ORS;  Service: Orthopedics;  Laterality: Right;  . TYMPANOPLASTY      Family History  Problem Relation Age of Onset  . Arthritis Mother   . Hyperlipidemia Mother   . Hypertension Mother   . Cancer Father        prostate  . Hyperlipidemia Father   . Hypertension Father   . Alcohol abuse Maternal Grandfather   . Breast cancer Neg Hx     SOCIAL HX: reviewed.    Current Outpatient Medications:  .  citalopram (CELEXA) 20 MG tablet, Take 1 tablet (20 mg total) by mouth daily., Disp: 90 tablet, Rfl: 1 .  valACYclovir (VALTREX) 500 MG tablet, Take 1 tablet (500 mg total) by mouth daily., Disp: 90 tablet, Rfl: 1  EXAM:  GENERAL: alert, oriented, appears well and in no  acute distress  HEENT: atraumatic, conjunttiva clear, no obvious abnormalities on inspection of external nose and ears  NECK: normal movements of the head and neck  LUNGS: on inspection no signs of respiratory distress, breathing rate appears normal, no obvious gross SOB, gasping or wheezing  CV: no obvious cyanosis  PSYCH/NEURO: pleasant and cooperative, no obvious depression or anxiety, speech and thought processing grossly intact  ASSESSMENT AND PLAN:  Discussed the following assessment and plan:  Hypercholesterolemia  Pendulous breast  Stress  Tobacco abuse  History of chest  pain  Hypercholesterolemia Discussed recent cholesterol results.  Discussed calculated cholesterol risk - 3.1%.  Discussed low cholesterol diet and exercise.  Follow lipid panel.    Pendulous breast Aggravating her neck/shoulders and back.  Still with discomfort. Plans to f/u with Dr Sandi Mariscal after restrictions lifted.    Stress Retired 09/02/2018.  Stress is better.  Has good support.  Does not feel needs anything more at this itme.  Continues on citalopram.    Tobacco abuse Smoking two cigarettes per day.  Discussed the need to quit.  Follow.    History of chest pain Previous chest pain.  EKG last visit - SR with no acute ischemic changes.  Discussed cardiology referral.  She wants to hold.  No further pain.  Staying active with no symptoms.  Follow.      I discussed the assessment and treatment plan with the patient. The patient was provided an opportunity to ask questions and all were answered. The patient agreed with the plan and demonstrated an understanding of the instructions.   The patient was advised to call back or seek an in-person evaluation if the symptoms worsen or if the condition fails to improve as anticipated.    Einar Pheasant, MD

## 2018-12-19 ENCOUNTER — Encounter: Payer: Self-pay | Admitting: Internal Medicine

## 2018-12-19 DIAGNOSIS — Z720 Tobacco use: Secondary | ICD-10-CM | POA: Insufficient documentation

## 2018-12-19 DIAGNOSIS — Z87898 Personal history of other specified conditions: Secondary | ICD-10-CM | POA: Insufficient documentation

## 2018-12-19 NOTE — Assessment & Plan Note (Signed)
Aggravating her neck/shoulders and back.  Still with discomfort. Plans to f/u with Dr Sandi Mariscal after restrictions lifted.

## 2018-12-19 NOTE — Assessment & Plan Note (Signed)
Retired 09/02/2018.  Stress is better.  Has good support.  Does not feel needs anything more at this itme.  Continues on citalopram.

## 2018-12-19 NOTE — Assessment & Plan Note (Signed)
Previous chest pain.  EKG last visit - SR with no acute ischemic changes.  Discussed cardiology referral.  She wants to hold.  No further pain.  Staying active with no symptoms.  Follow.

## 2018-12-19 NOTE — Assessment & Plan Note (Signed)
Smoking two cigarettes per day.  Discussed the need to quit.  Follow.

## 2018-12-19 NOTE — Assessment & Plan Note (Signed)
Discussed recent cholesterol results.  Discussed calculated cholesterol risk - 3.1%.  Discussed low cholesterol diet and exercise.  Follow lipid panel.

## 2019-02-03 ENCOUNTER — Encounter: Payer: Self-pay | Admitting: Internal Medicine

## 2019-02-04 NOTE — Telephone Encounter (Signed)
Per our conversation and the my chart message in [name]'s chart, she has already gone for testing.  Given he is positive, she and her daughter will need to quarantine.  Also need to social distance in the house.  Let me know if I need to talk to her or if she feels she needs appt - virtual.  Also, monitor for symptoms.

## 2019-02-05 NOTE — Telephone Encounter (Signed)
LMTCB

## 2019-02-05 NOTE — Telephone Encounter (Signed)
Pt aware and does not feel that she needs an appointment at this time.

## 2019-02-11 ENCOUNTER — Telehealth: Payer: Self-pay

## 2019-02-11 DIAGNOSIS — Z1231 Encounter for screening mammogram for malignant neoplasm of breast: Secondary | ICD-10-CM

## 2019-02-11 NOTE — Telephone Encounter (Signed)
Make sure spouse is still quarantined as well.

## 2019-02-11 NOTE — Telephone Encounter (Addendum)
Advised patient that I will be scheduling her mammogram. Also got update on her and her family. Her and her daughter both have been tested and came back negative. Still self isolating at home. No symptoms noted at this time. Spouse is feeling better as well. NO fever, SOB, etc. Advised them to let us know if they need anything

## 2019-02-11 NOTE — Telephone Encounter (Signed)
Spouse is still quarantined

## 2019-02-11 NOTE — Telephone Encounter (Signed)
-----   Message from Einar Pheasant, MD sent at 02/10/2019  4:10 AM EDT ----- Regarding: update and mammogram Needs mammogram scheduled. Also, was tested for COVID.  I am unable to pull up results.  Need to know if she has heard regarding her test results and need to know how she is doing.

## 2019-02-25 NOTE — Addendum Note (Signed)
Addended by: Lars Masson on: 02/25/2019 04:07 PM   Modules accepted: Orders

## 2019-04-05 ENCOUNTER — Ambulatory Visit
Admission: RE | Admit: 2019-04-05 | Discharge: 2019-04-05 | Disposition: A | Discharge status: Home / Self Care | Payer: BC Managed Care – PPO | Source: Ambulatory Visit | Attending: Internal Medicine | Admitting: Internal Medicine

## 2019-04-05 DIAGNOSIS — Z1231 Encounter for screening mammogram for malignant neoplasm of breast: Secondary | ICD-10-CM | POA: Diagnosis not present

## 2019-05-21 ENCOUNTER — Telehealth: Payer: Self-pay | Admitting: *Deleted

## 2019-05-21 DIAGNOSIS — E7800 Pure hypercholesterolemia, unspecified: Secondary | ICD-10-CM

## 2019-05-21 NOTE — Telephone Encounter (Signed)
Please place future orders for lab appt.  

## 2019-05-22 NOTE — Telephone Encounter (Signed)
Orders placed for f/u labs.  

## 2019-05-25 ENCOUNTER — Other Ambulatory Visit: Payer: Self-pay

## 2019-05-25 ENCOUNTER — Other Ambulatory Visit (INDEPENDENT_AMBULATORY_CARE_PROVIDER_SITE_OTHER): Payer: BC Managed Care – PPO

## 2019-05-25 DIAGNOSIS — E7800 Pure hypercholesterolemia, unspecified: Secondary | ICD-10-CM

## 2019-05-25 LAB — CBC WITH DIFFERENTIAL/PLATELET
Basophils Absolute: 0.1 10*3/uL (ref 0.0–0.1)
Basophils Relative: 1.2 % (ref 0.0–3.0)
Eosinophils Absolute: 0.4 10*3/uL (ref 0.0–0.7)
Eosinophils Relative: 4.7 % (ref 0.0–5.0)
HCT: 42.8 % (ref 36.0–46.0)
Hemoglobin: 14.7 g/dL (ref 12.0–15.0)
Lymphocytes Relative: 35 % (ref 12.0–46.0)
Lymphs Abs: 2.7 10*3/uL (ref 0.7–4.0)
MCHC: 34.4 g/dL (ref 30.0–36.0)
MCV: 95.7 fl (ref 78.0–100.0)
Monocytes Absolute: 0.4 10*3/uL (ref 0.1–1.0)
Monocytes Relative: 5.7 % (ref 3.0–12.0)
Neutro Abs: 4.1 10*3/uL (ref 1.4–7.7)
Neutrophils Relative %: 53.4 % (ref 43.0–77.0)
Platelets: 246 10*3/uL (ref 150.0–400.0)
RBC: 4.47 Mil/uL (ref 3.87–5.11)
RDW: 13 % (ref 11.5–15.5)
WBC: 7.6 10*3/uL (ref 4.0–10.5)

## 2019-05-25 LAB — LIPID PANEL
Cholesterol: 225 mg/dL — ABNORMAL HIGH (ref 0–200)
HDL: 82.5 mg/dL (ref >39.00)
LDL Cholesterol: 116 mg/dL — ABNORMAL HIGH (ref 0–99)
NonHDL: 142.93
Total CHOL/HDL Ratio: 3
Triglycerides: 133 mg/dL (ref 0.0–149.0)
VLDL: 26.6 mg/dL (ref 0.0–40.0)

## 2019-05-25 LAB — BASIC METABOLIC PANEL
BUN: 15 mg/dL (ref 6–23)
CO2: 30 mEq/L (ref 19–32)
Calcium: 9.7 mg/dL (ref 8.4–10.5)
Chloride: 104 mEq/L (ref 96–112)
Creatinine, Ser: 0.59 mg/dL (ref 0.40–1.20)
GFR: 106.01 mL/min (ref >60.00)
Glucose, Bld: 103 mg/dL — ABNORMAL HIGH (ref 70–99)
Potassium: 4.1 mEq/L (ref 3.5–5.1)
Sodium: 141 mEq/L (ref 135–145)

## 2019-05-25 LAB — HEPATIC FUNCTION PANEL
ALT: 21 U/L (ref 0–35)
AST: 18 U/L (ref 0–37)
Albumin: 4.2 g/dL (ref 3.5–5.2)
Alkaline Phosphatase: 122 U/L — ABNORMAL HIGH (ref 39–117)
Bilirubin, Direct: 0 mg/dL (ref 0.0–0.3)
Total Bilirubin: 0.7 mg/dL (ref 0.2–1.2)
Total Protein: 6.9 g/dL (ref 6.0–8.3)

## 2019-05-25 LAB — TSH: TSH: 1.73 u[IU]/mL (ref 0.35–4.50)

## 2019-05-27 ENCOUNTER — Ambulatory Visit (INDEPENDENT_AMBULATORY_CARE_PROVIDER_SITE_OTHER): Payer: BC Managed Care – PPO | Admitting: Internal Medicine

## 2019-05-27 ENCOUNTER — Other Ambulatory Visit: Payer: Self-pay

## 2019-05-27 ENCOUNTER — Encounter: Payer: Self-pay | Admitting: Internal Medicine

## 2019-05-27 DIAGNOSIS — E7800 Pure hypercholesterolemia, unspecified: Secondary | ICD-10-CM | POA: Diagnosis not present

## 2019-05-27 DIAGNOSIS — Z720 Tobacco use: Secondary | ICD-10-CM | POA: Diagnosis not present

## 2019-05-27 DIAGNOSIS — F439 Reaction to severe stress, unspecified: Secondary | ICD-10-CM

## 2019-05-27 DIAGNOSIS — R748 Abnormal levels of other serum enzymes: Secondary | ICD-10-CM

## 2019-05-27 NOTE — Progress Notes (Signed)
Patient ID: Maria Munoz, female   DOB: 1966-06-02, 53 y.o.   MRN: [id number]   Virtual Visit via video Note  This visit type was conducted due to national recommendations for restrictions regarding the COVID-19 pandemic (e.g. social distancing).  This format is felt to be most appropriate for this patient at this time.  All issues noted in this document were discussed and addressed.  No physical exam was performed (except for noted visual exam findings with Video Visits).   I connected with [name] by a video enabled telemedicine application or telephone and verified that I am speaking with the correct person using two identifiers. Location patient: home Location provider: work  Persons participating in the virtual visit: patient, provider  I discussed the limitations, risks, security and privacy concerns of performing an evaluation and management service by video and the availability of in person appointments.  The patient expressed understanding and agreed to proceed.   Reason for visit: scheduled follow up.    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  Decreased stress.  Overall she feels good.  No chest pain.  No sob.  No acid reflux.  No abdominal pain.  Bowels moving.  She is still smoking.  Only smoking 2 cigarettes per day.  Plans to quit.  Not ready yet.  Discussed the need to quit.  Discussed recent labs.  Discussed diet and exercise.  She plans to get more serious about her diet and exercise.     ROS: See pertinent positives and negatives per HPI.  Past Medical History:  Diagnosis Date  . Arthritis   . Herpes    H/O  . History of abnormal Pap smear   . Hypercholesterolemia     Past Surgical History:  Procedure Laterality Date  . ANTERIOR CRUCIATE LIGAMENT REPAIR Left 2009  . CERVICAL BIOPSY  W/ LOOP ELECTRODE EXCISION    . COLONOSCOPY WITH PROPOFOL N/A 10/18/2016   Procedure: COLONOSCOPY WITH PROPOFOL;  Surgeon: Lollie Sails,  MD;  Location: [facility] ENDOSCOPY;  Service: Endoscopy;  Laterality: N/A;  . JOINT REPLACEMENT     RT Knee  . NOVASURE ABLATION    . TOTAL KNEE ARTHROPLASTY Right 05/13/2016   Procedure: RIGHT TOTAL KNEE ARTHROPLASTY;  Surgeon: Gaynelle Arabian, MD;  Location: [facility] ORS;  Service: Orthopedics;  Laterality: Right;  . TYMPANOPLASTY      Family History  Problem Relation Age of Onset  . Arthritis Mother   . Hyperlipidemia Mother   . Hypertension Mother   . Cancer Father        prostate  . Hyperlipidemia Father   . Hypertension Father   . Alcohol abuse Maternal Grandfather   . Breast cancer Neg Hx     SOCIAL HX: reviewed.    Current Outpatient Medications:  .  citalopram (CELEXA) 20 MG tablet, Take 1 tablet (20 mg total) by mouth daily., Disp: 90 tablet, Rfl: 1 .  valACYclovir (VALTREX) 500 MG tablet, Take 1 tablet (500 mg total) by mouth daily., Disp: 90 tablet, Rfl: 1  EXAM:  VITALS per patient if applicable:  [id number]  GENERAL: alert, oriented, appears well and in no acute distress  HEENT: atraumatic, conjunttiva clear, no obvious abnormalities on inspection of external nose and ears  NECK: normal movements of the head and neck  LUNGS: on inspection no signs of respiratory distress, breathing rate appears normal, no obvious gross SOB, gasping or wheezing  CV: no obvious cyanosis  PSYCH/NEURO:  pleasant and cooperative, no obvious depression or anxiety, speech and thought processing grossly intact  ASSESSMENT AND PLAN:  Discussed the following assessment and plan:  Hypercholesterolemia Discussed recent labs.  Discussed low cholesterol diet and exercise.  Follow lipid panel.    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  Stress is better.  Still some family stress.  Follow.  Does not feel needs anything more at this time.   Tobacco abuse Discussed the need to stop smoking. Smoking 2 cigarettes per day.  Discussed the need to quit.    Elevated alkaline phosphatase level Slight elevation.   Discussed with her today.  Recheck liver panel.      I discussed the assessment and treatment plan with the patient. The patient was provided an opportunity to ask questions and all were answered. The patient agreed with the plan and demonstrated an understanding of the instructions.   The patient was advised to call back or seek an in-person evaluation if the symptoms worsen or if the condition fails to improve as anticipated.   Einar Pheasant, MD

## 2019-05-30 ENCOUNTER — Encounter: Payer: Self-pay | Admitting: Internal Medicine

## 2019-05-30 DIAGNOSIS — R748 Abnormal levels of other serum enzymes: Secondary | ICD-10-CM | POA: Insufficient documentation

## 2019-05-30 NOTE — Assessment & Plan Note (Signed)
Slight elevation.  Discussed with her today.  Recheck liver panel.

## 2019-05-30 NOTE — Assessment & Plan Note (Signed)
Retired 09/2018.  Stress is better.  Still some family stress.  Follow.  Does not feel needs anything more at this time.

## 2019-05-30 NOTE — Assessment & Plan Note (Signed)
Discussed the need to stop smoking. Smoking 2 cigarettes per day.  Discussed the need to quit.

## 2019-05-30 NOTE — Assessment & Plan Note (Signed)
Discussed recent labs.  Discussed low cholesterol diet and exercise.  Follow lipid panel.

## 2019-07-05 ENCOUNTER — Other Ambulatory Visit: Payer: BC Managed Care – PPO

## 2019-07-26 ENCOUNTER — Other Ambulatory Visit: Payer: Self-pay | Admitting: Internal Medicine

## 2019-08-25 ENCOUNTER — Other Ambulatory Visit: Payer: Self-pay | Admitting: Internal Medicine

## 2019-08-25 MED ORDER — CITALOPRAM HYDROBROMIDE 20 MG PO TABS: 20.0000 mg | ORAL_TABLET | Freq: Every day | ORAL | 5 refills | Status: DC

## 2019-08-25 MED ORDER — VALACYCLOVIR HCL 500 MG PO TABS: 500.0000 mg | ORAL_TABLET | Freq: Every day | ORAL | 5 refills | Status: DC

## 2019-08-25 NOTE — Telephone Encounter (Signed)
Patient called and needs medication, pharmacy closes tomorrow at 12 tomorrow. Also patient wants to know if she has to call office every month to get refilled.

## 2019-10-20 ENCOUNTER — Encounter: Payer: BC Managed Care – PPO | Admitting: Internal Medicine

## 2019-11-15 ENCOUNTER — Telehealth (INDEPENDENT_AMBULATORY_CARE_PROVIDER_SITE_OTHER): Payer: BC Managed Care – PPO | Admitting: Internal Medicine

## 2019-11-15 ENCOUNTER — Telehealth: Payer: Self-pay | Admitting: Internal Medicine

## 2019-11-15 DIAGNOSIS — F439 Reaction to severe stress, unspecified: Secondary | ICD-10-CM

## 2019-11-15 DIAGNOSIS — R748 Abnormal levels of other serum enzymes: Secondary | ICD-10-CM

## 2019-11-15 DIAGNOSIS — E7800 Pure hypercholesterolemia, unspecified: Secondary | ICD-10-CM | POA: Diagnosis not present

## 2019-11-15 NOTE — Progress Notes (Signed)
Patient ID: Maria Munoz, female   DOB: [birth year], 54 y.o.   MRN: [id number]   Virtual Visit via video Note  This visit type was conducted due to national recommendations for restrictions regarding the COVID-19 pandemic (e.g. social distancing).  This format is felt to be most appropriate for this patient at this time.  All issues noted in this document were discussed and addressed.  No physical exam was performed (except for noted visual exam findings with Video Visits).   I connected with [name] by a video enabled telemedicine application and verified that I am speaking with the correct person using two identifiers. Location patient: home Location provider: work  Persons participating in the virtual visit: patient, provider  The limitations, risks, security and privacy concerns of performing an evaluation and management service by video and the availability of in person appointments have been discussed.  The patient expressed understanding and agreed to proceed.   Reason for visit:  Scheduled follow up.    HPI: Overall doing well.  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  Plans to start exercising more.  Some tightness - joint (knees).  Stretching helps.  Climbing stairs can aggravate.  No chest pain.  No sob.  No significant sinus/allergies.  Daughter had covid.  States two days after daughter sick - she had fever, nausea and stomach issues.  Better now.  No abdominal pain.  Eating.  Bowels stable.     ROS: See pertinent positives and negatives per HPI.  Past Medical History:  Diagnosis Date  . Arthritis   . Herpes    H/O  . History of abnormal Pap smear   . Hypercholesterolemia     Past Surgical History:  Procedure Laterality Date  . ANTERIOR CRUCIATE LIGAMENT REPAIR Left 2009  . CERVICAL BIOPSY  W/ LOOP ELECTRODE EXCISION    . COLONOSCOPY WITH PROPOFOL N/A 10/18/2016   Procedure: COLONOSCOPY WITH PROPOFOL;  Surgeon: Lollie Sails, MD;  Location: [facility]  ENDOSCOPY;  Service: Endoscopy;  Laterality: N/A;  . JOINT REPLACEMENT     RT Knee  . NOVASURE ABLATION    . TOTAL KNEE ARTHROPLASTY Right 05/13/2016   Procedure: RIGHT TOTAL KNEE ARTHROPLASTY;  Surgeon: Gaynelle Arabian, MD;  Location: [facility] ORS;  Service: Orthopedics;  Laterality: Right;  . TYMPANOPLASTY      Family History  Problem Relation Age of Onset  . Arthritis Mother   . Hyperlipidemia Mother   . Hypertension Mother   . Cancer Father        prostate  . Hyperlipidemia Father   . Hypertension Father   . Alcohol abuse Maternal Grandfather   . Breast cancer Neg Hx     SOCIAL HX: reviewed.    Current Outpatient Medications:  .  citalopram (CELEXA) 20 MG tablet, Take 1 tablet (20 mg total) by mouth daily., Disp: 30 tablet, Rfl: 5 .  valACYclovir (VALTREX) 500 MG tablet, Take 1 tablet (500 mg total) by mouth daily., Disp: 30 tablet, Rfl: 5  EXAM:  GENERAL: alert, oriented, appears well and in no acute distress  HEENT: atraumatic, conjunttiva clear, no obvious abnormalities on inspection of external nose and ears  NECK: normal movements of the head and neck  LUNGS: on inspection no signs of respiratory distress, breathing rate appears normal, no obvious gross SOB, gasping or wheezing  CV: no obvious cyanosis  PSYCH/NEURO: pleasant and cooperative, no obvious depression or anxiety, speech and thought processing grossly intact  ASSESSMENT AND PLAN:  Discussed the following assessment and plan:  Elevated alkaline phosphatase level Slight elevation.  Follow liver function tests.    Hypercholesterolemia Low cholesterol diet and exercise.  Follow lipid panel.    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      I discussed the assessment and treatment plan with the patient. The patient was provided an opportunity to ask questions and all were answered. The patient agreed with the plan and demonstrated an understanding of the instructions.     The patient was advised to call back or seek an in-person evaluation if the symptoms worsen or if the condition fails to improve as anticipated.    Einar Pheasant, MD

## 2019-11-15 NOTE — Telephone Encounter (Signed)
Pt being seen now

## 2019-11-15 NOTE — Telephone Encounter (Signed)
Patient had an appointment today and had to leave. Patient wanted to know if she needed to reschedule from today.

## 2019-11-21 ENCOUNTER — Encounter: Payer: Self-pay | Admitting: Internal Medicine

## 2019-11-21 NOTE — Assessment & Plan Note (Signed)
Low cholesterol diet and exercise.  Follow lipid panel.   

## 2019-11-21 NOTE — Assessment & Plan Note (Signed)
Slight elevation.  Follow liver function tests.

## 2019-11-21 NOTE — Assessment & Plan Note (Signed)
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

## 2020-02-21 ENCOUNTER — Encounter: Payer: Self-pay | Admitting: Internal Medicine

## 2020-02-21 ENCOUNTER — Other Ambulatory Visit: Payer: Self-pay

## 2020-02-21 ENCOUNTER — Other Ambulatory Visit: Payer: Self-pay | Admitting: Internal Medicine

## 2020-02-21 MED ORDER — CITALOPRAM HYDROBROMIDE 20 MG PO TABS: 20.0000 mg | ORAL_TABLET | Freq: Every day | ORAL | 5 refills | Status: DC

## 2020-02-21 MED ORDER — VALACYCLOVIR HCL 500 MG PO TABS: 500.0000 mg | ORAL_TABLET | Freq: Every day | ORAL | 5 refills | Status: DC

## 2020-07-24 ENCOUNTER — Encounter: Payer: Self-pay | Admitting: Internal Medicine

## 2020-07-25 ENCOUNTER — Other Ambulatory Visit: Payer: Self-pay

## 2020-07-25 MED ORDER — CITALOPRAM HYDROBROMIDE 20 MG PO TABS: 20.0000 mg | ORAL_TABLET | Freq: Every day | ORAL | 5 refills | Status: DC

## 2020-07-25 MED ORDER — VALACYCLOVIR HCL 500 MG PO TABS: 500.0000 mg | ORAL_TABLET | Freq: Every day | ORAL | 5 refills | Status: DC

## 2020-07-25 NOTE — Telephone Encounter (Signed)
See other message

## 2020-08-30 ENCOUNTER — Encounter: Payer: Self-pay | Admitting: Internal Medicine

## 2020-08-30 NOTE — Telephone Encounter (Signed)
Catie, can you help with this.  See Ms Urick My Chart message.  She is referring to Doming[facility] [place].  I will send you his information in a phone message.

## 2020-10-02 ENCOUNTER — Encounter: Payer: Self-pay | Admitting: *Deleted

## 2020-11-16 ENCOUNTER — Encounter: Payer: Self-pay | Admitting: Internal Medicine

## 2020-11-16 ENCOUNTER — Other Ambulatory Visit: Payer: Self-pay

## 2020-11-16 ENCOUNTER — Other Ambulatory Visit (HOSPITAL_COMMUNITY)
Admission: RE | Admit: 2020-11-16 | Discharge: 2020-11-16 | Disposition: A | Discharge status: Home / Self Care | Payer: BC Managed Care – PPO | Source: Ambulatory Visit | Attending: Internal Medicine | Admitting: Internal Medicine

## 2020-11-16 ENCOUNTER — Ambulatory Visit (INDEPENDENT_AMBULATORY_CARE_PROVIDER_SITE_OTHER): Payer: BC Managed Care – PPO | Admitting: Internal Medicine

## 2020-11-16 VITALS — BP 100/62 | HR 89 | Temp 97.8°F | Resp 16 | Ht 65.0 in | Wt 171.0 lb

## 2020-11-16 DIAGNOSIS — Z1231 Encounter for screening mammogram for malignant neoplasm of breast: Secondary | ICD-10-CM

## 2020-11-16 DIAGNOSIS — Z0000 Encounter for general adult medical examination without abnormal findings: Secondary | ICD-10-CM | POA: Diagnosis not present

## 2020-11-16 DIAGNOSIS — Z860100 Personal history of colon polyps, unspecified: Secondary | ICD-10-CM

## 2020-11-16 DIAGNOSIS — Z720 Tobacco use: Secondary | ICD-10-CM

## 2020-11-16 DIAGNOSIS — Z124 Encounter for screening for malignant neoplasm of cervix: Secondary | ICD-10-CM | POA: Diagnosis not present

## 2020-11-16 DIAGNOSIS — E7800 Pure hypercholesterolemia, unspecified: Secondary | ICD-10-CM

## 2020-11-16 DIAGNOSIS — R748 Abnormal levels of other serum enzymes: Secondary | ICD-10-CM

## 2020-11-16 DIAGNOSIS — R87619 Unspecified abnormal cytological findings in specimens from cervix uteri: Secondary | ICD-10-CM

## 2020-11-16 DIAGNOSIS — F439 Reaction to severe stress, unspecified: Secondary | ICD-10-CM

## 2020-11-16 LAB — HEPATIC FUNCTION PANEL
ALT: 22 U/L (ref 0–35)
AST: 17 U/L (ref 0–37)
Albumin: 4.5 g/dL (ref 3.5–5.2)
Alkaline Phosphatase: 79 U/L (ref 39–117)
Bilirubin, Direct: 0.1 mg/dL (ref 0.0–0.3)
Total Bilirubin: 0.6 mg/dL (ref 0.2–1.2)
Total Protein: 7.4 g/dL (ref 6.0–8.3)

## 2020-11-16 LAB — BASIC METABOLIC PANEL
BUN: 18 mg/dL (ref 6–23)
CO2: 30 mEq/L (ref 19–32)
Calcium: 9.9 mg/dL (ref 8.4–10.5)
Chloride: 102 mEq/L (ref 96–112)
Creatinine, Ser: 0.59 mg/dL (ref 0.40–1.20)
GFR: 101.03 mL/min (ref >60.00)
Glucose, Bld: 104 mg/dL — ABNORMAL HIGH (ref 70–99)
Potassium: 4.2 mEq/L (ref 3.5–5.1)
Sodium: 141 mEq/L (ref 135–145)

## 2020-11-16 LAB — LIPID PANEL
Cholesterol: 179 mg/dL (ref 0–200)
HDL: 67.3 mg/dL (ref >39.00)
LDL Cholesterol: 94 mg/dL (ref 0–99)
NonHDL: 111.25
Total CHOL/HDL Ratio: 3
Triglycerides: 87 mg/dL (ref 0.0–149.0)
VLDL: 17.4 mg/dL (ref 0.0–40.0)

## 2020-11-16 LAB — CBC WITH DIFFERENTIAL/PLATELET
Basophils Absolute: 0.1 10*3/uL (ref 0.0–0.1)
Basophils Relative: 1.2 % (ref 0.0–3.0)
Eosinophils Absolute: 0.2 10*3/uL (ref 0.0–0.7)
Eosinophils Relative: 2.5 % (ref 0.0–5.0)
HCT: 43.3 % (ref 36.0–46.0)
Hemoglobin: 14.9 g/dL (ref 12.0–15.0)
Lymphocytes Relative: 35.6 % (ref 12.0–46.0)
Lymphs Abs: 2.6 10*3/uL (ref 0.7–4.0)
MCHC: 34.3 g/dL (ref 30.0–36.0)
MCV: 94.1 fl (ref 78.0–100.0)
Monocytes Absolute: 0.5 10*3/uL (ref 0.1–1.0)
Monocytes Relative: 6.6 % (ref 3.0–12.0)
Neutro Abs: 4 10*3/uL (ref 1.4–7.7)
Neutrophils Relative %: 54.1 % (ref 43.0–77.0)
Platelets: 253 10*3/uL (ref 150.0–400.0)
RBC: 4.6 Mil/uL (ref 3.87–5.11)
RDW: 13.2 % (ref 11.5–15.5)
WBC: 7.3 10*3/uL (ref 4.0–10.5)

## 2020-11-16 LAB — TSH: TSH: 1.89 u[IU]/mL (ref 0.35–4.50)

## 2020-11-16 MED ORDER — VALACYCLOVIR HCL 500 MG PO TABS: 500.0000 mg | ORAL_TABLET | Freq: Every day | ORAL | 5 refills | Status: DC

## 2020-11-16 MED ORDER — CITALOPRAM HYDROBROMIDE 20 MG PO TABS: 20.0000 mg | ORAL_TABLET | Freq: Every day | ORAL | 5 refills | Status: DC

## 2020-11-16 NOTE — Assessment & Plan Note (Signed)
Physical today 11/16/20.  PAP 11/16/20.  Mammogram overdue.  Ordered.  Colonoscopy 10/2016 - one tubular adenomatous polyp in the ascending colon and diverticulosis.

## 2020-11-16 NOTE — Progress Notes (Signed)
Patient ID: Maria Munoz, female   DOB: 1966-04-15, 55 y.o.   MRN: [id number]   Subjective:    Patient ID: Maria Munoz, female    DOB: 11-01-1965, 55 y.o.   MRN: [id number]  HPI This visit occurred during the SARS-CoV-2 public health emergency.  Safety protocols were in place, including screening questions prior to the visit, additional usage of staff PPE, and extensive cleaning of exam room while observing appropriate contact time as indicated for disinfecting solutions.  Patient here for her physical exam.  She reports she is doing relatively well.  Handling stress.  Stays active.  Has been walking.  No chest pain or sob reported.  No abdominal pain or bowel change.  No vaginal bleeding.  Has adjusted diet.  Lost weight.  Feels good.   Past Medical History:  Diagnosis Date  . Arthritis   . Herpes    H/O  . History of abnormal Pap smear   . Hypercholesterolemia    Past Surgical History:  Procedure Laterality Date  . ANTERIOR CRUCIATE LIGAMENT REPAIR Left 2009  . CERVICAL BIOPSY  W/ LOOP ELECTRODE EXCISION    . COLONOSCOPY WITH PROPOFOL N/A 10/18/2016   Procedure: COLONOSCOPY WITH PROPOFOL;  Surgeon: Lollie Sails, MD;  Location: [facility] ENDOSCOPY;  Service: Endoscopy;  Laterality: N/A;  . JOINT REPLACEMENT     RT Knee  . NOVASURE ABLATION    . TOTAL KNEE ARTHROPLASTY Right 05/13/2016   Procedure: RIGHT TOTAL KNEE ARTHROPLASTY;  Surgeon: Gaynelle Arabian, MD;  Location: [facility] ORS;  Service: Orthopedics;  Laterality: Right;  . TYMPANOPLASTY     Family History  Problem Relation Age of Onset  . Arthritis Mother   . Hyperlipidemia Mother   . Hypertension Mother   . Cancer Father        prostate  . Hyperlipidemia Father   . Hypertension Father   . Alcohol abuse Maternal Grandfather   . Breast cancer Neg Hx    Social History   Socioeconomic History  . Marital status: Divorced    Spouse name: Not on file  . Number of children: Not on file  . Years of education: Not on file  .  Highest education level: Not on file  Occupational History  . Not on file  Tobacco Use  . Smoking status: Current Every Day Smoker    Packs/day: 0.25    Years: 3.00    Pack years: 0.75    Types: Cigarettes  . Smokeless tobacco: Never Used  . Tobacco comment: 2 cigarettes a day  Vaping Use  . Vaping Use: Never used  Substance and Sexual Activity  . Alcohol use: Yes    Alcohol/week: 0.0 standard drinks    Comment: socially  . Drug use: No  . Sexual activity: Not on file  Other Topics Concern  . Not on file  Social History Narrative  . Not on file   Social Determinants of Health   Financial Resource Strain: Not on file  Food Insecurity: Not on file  Transportation Needs: Not on file  Physical Activity: Not on file  Stress: Not on file  Social Connections: Not on file    Outpatient Encounter Medications as of 11/16/2020  Medication Sig  . citalopram (CELEXA) 20 MG tablet Take 1 tablet (20 mg total) by mouth daily.  . valACYclovir (VALTREX) 500 MG tablet Take 1 tablet (500 mg total) by mouth daily.  . [DISCONTINUED] citalopram (CELEXA) 20 MG tablet Take 1 tablet (20 mg total) by mouth  daily.  . [DISCONTINUED] valACYclovir (VALTREX) 500 MG tablet Take 1 tablet (500 mg total) by mouth daily.   No facility-administered encounter medications on file as of 11/16/2020.    Review of Systems  Constitutional: Negative for appetite change and unexpected weight change.       Has adjusted diet and lost weight.   HENT: Negative for congestion, sinus pressure and sore throat.   Eyes: Negative for pain and visual disturbance.  Respiratory: Negative for cough, chest tightness and shortness of breath.   Cardiovascular: Negative for chest pain, palpitations and leg swelling.  Gastrointestinal: Negative for abdominal pain, diarrhea, nausea and vomiting.  Genitourinary: Negative for difficulty urinating and dysuria.  Musculoskeletal: Negative for joint swelling and myalgias.  Skin:  Negative for color change and rash.  Neurological: Negative for dizziness, light-headedness and headaches.  Hematological: Negative for adenopathy. Does not bruise/bleed easily.  Psychiatric/Behavioral: Negative for agitation and decreased concentration.       Objective:    Physical Exam Vitals reviewed.  Constitutional:      General: She is not in acute distress.    Appearance: Normal appearance. She is well-developed.  HENT:     Head: Normocephalic and atraumatic.     Right Ear: External ear normal.     Left Ear: External ear normal.  Eyes:     General: No scleral icterus.       Right eye: No discharge.        Left eye: No discharge.     Conjunctiva/sclera: Conjunctivae normal.  Neck:     Thyroid: No thyromegaly.  Cardiovascular:     Rate and Rhythm: Normal rate and regular rhythm.  Pulmonary:     Effort: No tachypnea, accessory muscle usage or respiratory distress.     Breath sounds: Normal breath sounds. No decreased breath sounds or wheezing.  Chest:  Breasts:     Right: No inverted nipple, mass, nipple discharge or tenderness (no axillary adenopathy).     Left: No inverted nipple, mass, nipple discharge or tenderness (no axilarry adenopathy).    Abdominal:     General: Bowel sounds are normal.     Palpations: Abdomen is soft.     Tenderness: There is no abdominal tenderness.  Genitourinary:    Comments: Normal external genitalia.  Vaginal vault without lesions.  Cervix identified.  Pap smear performed.  Could not appreciate any adnexal masses or tenderness.   Musculoskeletal:        General: No swelling or tenderness.     Cervical back: Neck supple. No tenderness.  Lymphadenopathy:     Cervical: No cervical adenopathy.  Skin:    Findings: No erythema or rash.  Neurological:     Mental Status: She is alert and oriented to person, place, and time.  Psychiatric:        Mood and Affect: Mood normal.        Behavior: Behavior normal.     BP 100/62   Pulse 89    Temp 97.8 F (36.6 C) (Oral)   Resp 16   Ht 5\' 5"  (1.651 m)   Wt 171 lb (77.6 kg)   SpO2 97%   BMI 28.46 kg/m  Wt Readings from Last 3 Encounters:  11/16/20 171 lb (77.6 kg)  11/15/19 191 lb (86.6 kg)  05/30/19 191 lb (86.6 kg)     Lab Results  Component Value Date   WBC 7.3 11/16/2020   HGB 14.9 11/16/2020   HCT 43.3 11/16/2020   PLT 253.0 11/16/2020  GLUCOSE 104 (H) 11/16/2020   CHOL 179 11/16/2020   TRIG 87.0 11/16/2020   HDL 67.30 11/16/2020   LDLDIRECT 104.1 07/23/2013   LDLCALC 94 11/16/2020   ALT 22 11/16/2020   AST 17 11/16/2020   NA 141 11/16/2020   K 4.2 11/16/2020   CL 102 11/16/2020   CREATININE 0.59 11/16/2020   BUN 18 11/16/2020   CO2 30 11/16/2020   TSH 1.89 11/16/2020   INR 0.84 05/03/2016    MM 3D SCREEN BREAST BILATERAL  Result Date: 04/05/2019 CLINICAL DATA:  Screening. EXAM: DIGITAL SCREENING BILATERAL MAMMOGRAM WITH TOMO AND CAD COMPARISON:  Previous exam(s). ACR Breast Density Category b: There are scattered areas of fibroglandular density. FINDINGS: There are no findings suspicious for malignancy. Images were processed with CAD. IMPRESSION: No mammographic evidence of malignancy. A result letter of this screening mammogram will be mailed directly to the patient. RECOMMENDATION: Screening mammogram in one year. (Code:SM-B-01Y) BI-RADS CATEGORY  1: Negative. Electronically Signed   By: Lillia Mountain M.D.   On: 04/05/2019 13:46       Assessment & Plan:   Problem List Items Addressed This Visit    Abnormal Pap smear of cervix    Repeat pap today.       Elevated alkaline phosphatase level    Recheck liver panel.       Health care maintenance    Physical today 11/16/20.  PAP 11/16/20.  Mammogram overdue.  Ordered.  Colonoscopy 10/2016 - one tubular adenomatous polyp in the ascending colon and diverticulosis.        History of colon polyps    Colonoscopy 2018.  Per report - due f/u colonoscopy       Relevant Orders   Ambulatory referral  to Gastroenterology   Hypercholesterolemia    The 10-year ASCVD risk score Mikey Bussing DC Brooke Bonito., et al., 2013) is: 2.4%   Values used to calculate the score:     Age: 23 years     Sex: Female     Is Non-Hispanic African American: No     Diabetic: No     Tobacco smoker: Yes     Systolic Blood Pressure: 093 mmHg     Is BP treated: No     HDL Cholesterol: 67.3 mg/dL     Total Cholesterol: 179 mg/dL  Low cholesterol diet and exercise. Follow lipid panel.       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       Tobacco abuse    Have discussed the need to stop smoking.  Follow.        Other Visit Diagnoses    Routine general medical examination at a health care facility    -  Primary   Encounter for screening mammogram for malignant neoplasm of breast       Relevant Orders   MM 3D SCREEN BREAST BILATERAL   Cervical cancer screening       Relevant Orders   Cytology - PAP( ) (Completed)       Einar Pheasant, MD

## 2020-11-20 LAB — CYTOLOGY - PAP
Comment: NEGATIVE
Diagnosis: UNDETERMINED — AB
High risk HPV: NEGATIVE

## 2020-11-26 ENCOUNTER — Encounter: Payer: Self-pay | Admitting: Internal Medicine

## 2020-11-26 NOTE — Assessment & Plan Note (Signed)
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

## 2020-11-26 NOTE — Assessment & Plan Note (Signed)
Colonoscopy 2018.  Per report - due f/u colonoscopy

## 2020-11-26 NOTE — Assessment & Plan Note (Signed)
Have discussed the need to stop smoking.  Follow.

## 2020-11-26 NOTE — Assessment & Plan Note (Signed)
The 10-year ASCVD risk score Mikey Bussing DC Brooke Bonito., et al., 2013) is: 2.4%   Values used to calculate the score:     Age: 55 years     Sex: Female     Is Non-Hispanic African American: No     Diabetic: No     Tobacco smoker: Yes     Systolic Blood Pressure: 235 mmHg     Is BP treated: No     HDL Cholesterol: 67.3 mg/dL     Total Cholesterol: 179 mg/dL  Low cholesterol diet and exercise. Follow lipid panel.

## 2020-11-26 NOTE — Assessment & Plan Note (Signed)
Recheck liver panel.  

## 2020-11-26 NOTE — Assessment & Plan Note (Signed)
Repeat pap today

## 2020-12-06 ENCOUNTER — Ambulatory Visit
Admission: RE | Admit: 2020-12-06 | Discharge: 2020-12-06 | Disposition: A | Discharge status: Home / Self Care | Payer: BC Managed Care – PPO | Source: Ambulatory Visit | Attending: Internal Medicine | Admitting: Internal Medicine

## 2020-12-06 ENCOUNTER — Other Ambulatory Visit: Payer: Self-pay

## 2020-12-06 DIAGNOSIS — Z1231 Encounter for screening mammogram for malignant neoplasm of breast: Secondary | ICD-10-CM | POA: Diagnosis not present

## 2021-05-16 ENCOUNTER — Other Ambulatory Visit: Payer: Self-pay | Admitting: Internal Medicine

## 2021-05-20 ENCOUNTER — Encounter: Payer: Self-pay | Admitting: Internal Medicine

## 2021-05-21 ENCOUNTER — Ambulatory Visit: Payer: BC Managed Care – PPO | Admitting: Internal Medicine

## 2021-05-30 ENCOUNTER — Encounter: Payer: Self-pay | Admitting: Internal Medicine

## 2021-05-31 ENCOUNTER — Ambulatory Visit: Payer: BC Managed Care – PPO | Admitting: Anesthesiology

## 2021-05-31 ENCOUNTER — Ambulatory Visit
Admission: RE | Admit: 2021-05-31 | Discharge: 2021-05-31 | Disposition: A | Discharge status: Home / Self Care | Payer: BC Managed Care – PPO | Attending: Gastroenterology | Admitting: Gastroenterology

## 2021-05-31 ENCOUNTER — Encounter
Admission: RE | Disposition: A | Discharge status: Home / Self Care | Payer: Self-pay | Source: Home / Self Care | Attending: Gastroenterology

## 2021-05-31 ENCOUNTER — Other Ambulatory Visit: Payer: Self-pay

## 2021-05-31 ENCOUNTER — Encounter: Payer: Self-pay | Admitting: Internal Medicine

## 2021-05-31 DIAGNOSIS — K640 First degree hemorrhoids: Secondary | ICD-10-CM | POA: Diagnosis not present

## 2021-05-31 DIAGNOSIS — D127 Benign neoplasm of rectosigmoid junction: Secondary | ICD-10-CM | POA: Diagnosis not present

## 2021-05-31 DIAGNOSIS — E7800 Pure hypercholesterolemia, unspecified: Secondary | ICD-10-CM | POA: Insufficient documentation

## 2021-05-31 DIAGNOSIS — K5730 Diverticulosis of large intestine without perforation or abscess without bleeding: Secondary | ICD-10-CM | POA: Diagnosis not present

## 2021-05-31 DIAGNOSIS — J449 Chronic obstructive pulmonary disease, unspecified: Secondary | ICD-10-CM | POA: Diagnosis not present

## 2021-05-31 DIAGNOSIS — F17210 Nicotine dependence, cigarettes, uncomplicated: Secondary | ICD-10-CM | POA: Diagnosis not present

## 2021-05-31 DIAGNOSIS — Z880 Allergy status to penicillin: Secondary | ICD-10-CM | POA: Diagnosis not present

## 2021-05-31 DIAGNOSIS — Z86010 Personal history of colonic polyps: Secondary | ICD-10-CM | POA: Insufficient documentation

## 2021-05-31 DIAGNOSIS — D122 Benign neoplasm of ascending colon: Secondary | ICD-10-CM | POA: Diagnosis not present

## 2021-05-31 HISTORY — PX: COLONOSCOPY WITH PROPOFOL: SHX5780

## 2021-05-31 LAB — HM COLONOSCOPY

## 2021-05-31 SURGERY — COLONOSCOPY WITH PROPOFOL
Anesthesia: General

## 2021-05-31 MED ORDER — PHENYLEPHRINE HCL (PRESSORS) 10 MG/ML IV SOLN
INTRAVENOUS | Status: AC
  Filled 2021-05-31: qty 1

## 2021-05-31 MED ORDER — PROPOFOL 10 MG/ML IV BOLUS
INTRAVENOUS | Status: DC | PRN
  Administered 2021-05-31 (×2): 20 mg via INTRAVENOUS
  Administered 2021-05-31: 30 mg via INTRAVENOUS

## 2021-05-31 MED ORDER — SODIUM CHLORIDE 0.9 % IV SOLN: INTRAVENOUS | Status: DC

## 2021-05-31 MED ORDER — MIDAZOLAM HCL 2 MG/2ML IJ SOLN
INTRAMUSCULAR | Status: DC | PRN
  Administered 2021-05-31: 2 mg via INTRAVENOUS

## 2021-05-31 MED ORDER — PROPOFOL 500 MG/50ML IV EMUL
INTRAVENOUS | Status: DC | PRN
  Administered 2021-05-31: 50 ug/kg/min via INTRAVENOUS

## 2021-05-31 MED ORDER — PROPOFOL 500 MG/50ML IV EMUL
INTRAVENOUS | Status: AC
  Filled 2021-05-31: qty 50

## 2021-05-31 MED ORDER — LIDOCAINE HCL (PF) 2 % IJ SOLN
INTRAMUSCULAR | Status: AC
  Filled 2021-05-31: qty 10

## 2021-05-31 MED ORDER — FENTANYL CITRATE (PF) 100 MCG/2ML IJ SOLN
INTRAMUSCULAR | Status: DC | PRN
  Administered 2021-05-31 (×2): 25 ug via INTRAVENOUS
  Administered 2021-05-31: 50 ug via INTRAVENOUS

## 2021-05-31 MED ORDER — LIDOCAINE HCL (CARDIAC) PF 100 MG/5ML IV SOSY
PREFILLED_SYRINGE | INTRAVENOUS | Status: DC | PRN
  Administered 2021-05-31: 50 mg via INTRAVENOUS

## 2021-05-31 MED ORDER — FENTANYL CITRATE (PF) 100 MCG/2ML IJ SOLN
INTRAMUSCULAR | Status: AC
  Filled 2021-05-31: qty 2

## 2021-05-31 MED ORDER — MIDAZOLAM HCL 2 MG/2ML IJ SOLN
INTRAMUSCULAR | Status: AC
  Filled 2021-05-31: qty 2

## 2021-05-31 NOTE — Anesthesia Preprocedure Evaluation (Signed)
Anesthesia Evaluation  Patient identified by MRN, date of birth, ID band Patient awake    Reviewed: Allergy & Precautions, H&P , NPO status , Patient's Chart, lab work & pertinent test results  History of Anesthesia Complications (+) PONV and history of anesthetic complications  Airway Mallampati: III  TM Distance: >3 FB Neck ROM: full    Dental  (+) Poor Dentition, Chipped, Missing   Pulmonary neg shortness of breath, COPD, Current Smoker and Patient abstained from smoking.,    Pulmonary exam normal breath sounds clear to auscultation       Cardiovascular Exercise Tolerance: Good (-) angina(-) Past MI and (-) DOE negative cardio ROS Normal cardiovascular exam Rhythm:regular Rate:Normal     Neuro/Psych  Neuromuscular disease negative psych ROS   GI/Hepatic negative GI ROS, Neg liver ROS, neg GERD  ,  Endo/Other  negative endocrine ROS  Renal/GU negative Renal ROS  negative genitourinary   Musculoskeletal  (+) Arthritis ,   Abdominal   Peds  Hematology negative hematology ROS (+)   Anesthesia Other Findings Signs and symptoms suggestive of sleep apnea   Past Medical History: No date: Arthritis No date: Herpes     Comment: H/O No date: History of abnormal Pap smear No date: Hypercholesterolemia  Past Surgical History: 2009: ANTERIOR CRUCIATE LIGAMENT REPAIR Left No date: CERVICAL BIOPSY  W/ LOOP ELECTRODE EXCISION No date: JOINT REPLACEMENT     Comment: RT Knee No date: NOVASURE ABLATION 05/13/2016: TOTAL KNEE ARTHROPLASTY Right     Comment: Procedure: RIGHT TOTAL KNEE ARTHROPLASTY;                Surgeon: Gaynelle Arabian, MD;  Location: [facility] ORS;               Service: Orthopedics;  Laterality: Right; No date: TYMPANOPLASTY  BMI    Body Mass Index:  26.63 kg/m      Reproductive/Obstetrics negative OB ROS                             Anesthesia Physical  Anesthesia  Plan  ASA: III  Anesthesia Plan: General   Post-op Pain Management:    Induction: Intravenous  PONV Risk Score and Plan:   Airway Management Planned: Natural Airway and Nasal Cannula  Additional Equipment:   Intra-op Plan:   Post-operative Plan:   Informed Consent: I have reviewed the patients History and Physical, chart, labs and discussed the procedure including the risks, benefits and alternatives for the proposed anesthesia with the patient or authorized representative who has indicated his/her understanding and acceptance.     Dental Advisory Given  Plan Discussed with: Anesthesiologist, CRNA and Surgeon  Anesthesia Plan Comments: (Patient consented for risks of anesthesia including but not limited to:  - adverse reactions to medications - risk of airway placement if required - damage to eyes, teeth, lips or other oral mucosa - nerve damage due to positioning  - sore throat or hoarseness - Damage to heart, brain, nerves, lungs, other parts of body or loss of life  Patient voiced understanding.)        Anesthesia Quick Evaluation

## 2021-05-31 NOTE — H&P (Signed)
[facility] Pre-Procedure H&P   Patient ID: Maria Munoz is a 55 y.o. female.  Gastroenterology Provider: Annamaria Helling, DO  Referring Provider: Dr. Nicki Reaper PCP: Einar Pheasant, MD  Date: 05/31/2021  HPI Maria Munoz is a 55 y.o. female who presents today for Colonoscopy for personal history of colon polyps.  BM have been regular w/o blood or melena.  Patient denies nausea, vomiting, coffee ground emesis, hematemesis, abdominal pain, diarrhea, constipation, melena, hematochezia, fever, chills, or abnormal weight loss.  No other acute gi complaints.  Past Medical History:  Diagnosis Date   Arthritis    Herpes    H/O   History of abnormal Pap smear    Hypercholesterolemia     Past Surgical History:  Procedure Laterality Date   ANTERIOR CRUCIATE LIGAMENT REPAIR Left 2009   CERVICAL BIOPSY  W/ LOOP ELECTRODE EXCISION     COLONOSCOPY WITH PROPOFOL N/A 10/18/2016   Procedure: COLONOSCOPY WITH PROPOFOL;  Surgeon: Lollie Sails, MD;  Location: [facility] ENDOSCOPY;  Service: Endoscopy;  Laterality: N/A;   JOINT REPLACEMENT     RT Knee   NOVASURE ABLATION     TOTAL KNEE ARTHROPLASTY Right 05/13/2016   Procedure: RIGHT TOTAL KNEE ARTHROPLASTY;  Surgeon: Gaynelle Arabian, MD;  Location: [facility] ORS;  Service: Orthopedics;  Laterality: Right;   TYMPANOPLASTY      Family History No h/o GI disease or malignancy  Review of Systems  Constitutional:  Negative for activity change, appetite change, fatigue, fever and unexpected weight change.  HENT:  Negative for trouble swallowing and voice change.   Respiratory:  Negative for shortness of breath and wheezing.   Cardiovascular:  Negative for chest pain and palpitations.  Gastrointestinal:  Negative for abdominal distention, abdominal pain, anal bleeding, blood in stool, constipation, diarrhea, nausea, rectal pain and vomiting.  Musculoskeletal:  Negative for arthralgias and myalgias.  Skin:  Negative for color change  and pallor.  Neurological:  Negative for dizziness, syncope and weakness.  Psychiatric/Behavioral:  Negative for confusion.   All other systems reviewed and are negative.   Medications No current facility-administered medications on file prior to encounter.   Current Outpatient Medications on File Prior to Encounter  Medication Sig Dispense Refill   valACYclovir (VALTREX) 500 MG tablet Take 1 tablet (500 mg total) by mouth daily. 30 tablet 5    Pertinent medications related to GI and procedure were reviewed by me with the patient prior to the procedure   Current Facility-Administered Medications:    0.9 %  sodium chloride infusion, , Intravenous, Continuous, Annamaria Helling, DO, Last Rate: 20 mL/hr at 05/31/21 0706, New Bag at 05/31/21 0706  sodium chloride 20 mL/hr at 05/31/21 8099       Allergies  Allergen Reactions   Spinach Anaphylaxis and Other (See Comments)    Most dark, leafy greens   Ampicillin Hives    Has patient had a PCN reaction causing immediate rash, facial/tongue/throat swelling, SOB or lightheadedness with hypotension:unsure Has patient had a PCN reaction causing severe rash involving mucus membranes or skin necrosis:unsure Has patient had a PCN reaction that required hospitalization:No Has patient had a PCN reaction occurring within the last 10 years:No If all of the above answers are "NO", then may proceed with Cephalosporin use. Childhood Reaction   Allergies were reviewed by me prior to the procedure  Objective    Vitals:   05/31/21 0653  BP: 113/76  Pulse: (!) 58  Resp: 16  Temp: (!) 97.4 F (36.3  C)  TempSrc: Temporal  SpO2: 98%  Weight: 68 kg  Height: 5\' 7"  (1.702 m)     Physical Exam Vitals reviewed.  Constitutional:      General: She is not in acute distress.    Appearance: Normal appearance. She is normal weight. She is not ill-appearing, toxic-appearing or diaphoretic.  HENT:     Head: Normocephalic and atraumatic.      Nose: Nose normal.     Mouth/Throat:     Mouth: Mucous membranes are moist.     Pharynx: Oropharynx is clear.  Eyes:     General: No scleral icterus.    Extraocular Movements: Extraocular movements intact.  Cardiovascular:     Rate and Rhythm: Regular rhythm. Bradycardia present.     Heart sounds: Normal heart sounds. No murmur heard.   No friction rub. No gallop.  Pulmonary:     Effort: Pulmonary effort is normal. No respiratory distress.     Breath sounds: Normal breath sounds. No wheezing, rhonchi or rales.  Abdominal:     General: Abdomen is flat. Bowel sounds are normal. There is no distension.     Palpations: Abdomen is soft.     Tenderness: There is no abdominal tenderness. There is no guarding or rebound.  Musculoskeletal:     Cervical back: Neck supple.     Right lower leg: No edema.     Left lower leg: No edema.  Skin:    General: Skin is warm and dry.     Coloration: Skin is not jaundiced or pale.  Neurological:     General: No focal deficit present.     Mental Status: She is alert and oriented to person, place, and time. Mental status is at baseline.  Psychiatric:        Mood and Affect: Mood normal.        Behavior: Behavior normal.        Thought Content: Thought content normal.        Judgment: Judgment normal.     Assessment:  Maria Munoz is a 55 y.o. female  who presents today for Colonoscopy for personal history of colon polyps.  Plan:  Colonoscopy with possible intervention today  Colonoscopy with possible biopsy, control of bleeding, polypectomy, and interventions as necessary has been discussed with the patient/patient representative. Informed consent was obtained from the patient/patient representative after explaining the indication, nature, and risks of the procedure including but not limited to death, bleeding, perforation, missed neoplasm/lesions, cardiorespiratory compromise, and reaction to medications. Opportunity for questions was given and  appropriate answers were provided. Patient/patient representative has verbalized understanding is amenable to undergoing the procedure.   Annamaria Helling, DO  [facility]  Portions of the record may have been created with voice recognition software. Occasional wrong-word or 'sound-a-like' substitutions may have occurred due to the inherent limitations of voice recognition software.  Read the chart carefully and recognize, using context, where substitutions may have occurred.

## 2021-05-31 NOTE — Interval H&P Note (Signed)
History and Physical Interval Note: Preprocedure H&P from 05/31/21  was reviewed and there was no interval change after seeing and examining the patient.  Written consent was obtained from the patient after discussion of risks, benefits, and alternatives. Patient has consented to proceed with Colonoscopy with possible intervention   05/31/2021 7:34 AM  Deno Etienne  has presented today for surgery, with the diagnosis of [place] - HX TORTUOUS COLON.  The various methods of treatment have been discussed with the patient and family. After consideration of risks, benefits and other options for treatment, the patient has consented to  Procedure(s): COLONOSCOPY WITH PROPOFOL (N/A) as a surgical intervention.  The patient's history has been reviewed, patient examined, no change in status, stable for surgery.  I have reviewed the patient's chart and labs.  Questions were answered to the patient's satisfaction.     Annamaria Helling

## 2021-05-31 NOTE — Transfer of Care (Signed)
Immediate Anesthesia Transfer of Care Note  Patient: TATTIANA FAKHOURI  Procedure(s) Performed: COLONOSCOPY WITH PROPOFOL  Patient Location: PACU  Anesthesia Type:General  Level of Consciousness: sedated  Airway & Oxygen Therapy: Patient Spontanous Breathing and Patient connected to nasal cannula oxygen  Post-op Assessment: Report given to RN and Post -op Vital signs reviewed and stable  Post vital signs: Reviewed and stable  Last Vitals:  Vitals Value Taken Time  BP 115/71 05/31/21 0825  Temp    Pulse 62 05/31/21 0825  Resp 12 05/31/21 0825  SpO2 100 % 05/31/21 0825  Vitals shown include unvalidated device data.  Last Pain:  Vitals:   05/31/21 0822  TempSrc:   PainSc: 0-No pain         Complications: No notable events documented.

## 2021-05-31 NOTE — Anesthesia Postprocedure Evaluation (Signed)
Anesthesia Post Note  Patient: JAMIN HUMPHRIES  Procedure(s) Performed: COLONOSCOPY WITH PROPOFOL  Patient location during evaluation: Endoscopy Anesthesia Type: General Level of consciousness: awake and alert Pain management: pain level controlled Vital Signs Assessment: post-procedure vital signs reviewed and stable Respiratory status: spontaneous breathing, nonlabored ventilation, respiratory function stable and patient connected to nasal cannula oxygen Cardiovascular status: blood pressure returned to baseline and stable Postop Assessment: no apparent nausea or vomiting Anesthetic complications: no   No notable events documented.   Last Vitals:  Vitals:   05/31/21 0653 05/31/21 0834  BP: 113/76 106/73  Pulse: (!) 58   Resp: 16   Temp: (!) 36.3 C   SpO2: 98%     Last Pain:  Vitals:   05/31/21 0834  TempSrc:   PainSc: 0-No pain                 Precious Haws Marria Mathison

## 2021-05-31 NOTE — Op Note (Signed)
[facility] Gastroenterology Patient Name: Maria Munoz Procedure Date: 05/31/2021 7:34 AM MRN: [id number] Account #: [id number] Date of Birth: [birth year] Admit Type: Outpatient Age: 55 Room: [facility] ENDO ROOM 1 Gender: Female Note Status: Finalized Instrument Name: Colonoscope [id number] Procedure:             Colonoscopy Indications:           High risk colon cancer surveillance: Personal history                         of colonic polyps Providers:             Annamaria Helling DO, DO Referring MD:          Einar Pheasant, MD (Referring MD) Medicines:             Monitored Anesthesia Care Complications:         No immediate complications. Estimated blood loss:                         Minimal. Procedure:             Pre-Anesthesia Assessment:                        - Prior to the procedure, a History and Physical was                         performed, and patient medications and allergies were                         reviewed. The patient is competent. The risks and                         benefits of the procedure and the sedation options and                         risks were discussed with the patient. All questions                         were answered and informed consent was obtained.                         Patient identification and proposed procedure were                         verified by the physician, the nurse, the anesthetist                         and the technician in the endoscopy suite. Mental                         Status Examination: alert and oriented. Airway                         Examination: normal oropharyngeal airway and neck                         mobility. Respiratory Examination: clear to  auscultation. CV Examination: RRR, no murmurs, no S3                         or S4. Prophylactic Antibiotics: The patient does not                         require prophylactic antibiotics. Prior                          Anticoagulants: The patient has taken no previous                         anticoagulant or antiplatelet agents. ASA Grade                         Assessment: III - A patient with severe systemic                         disease. After reviewing the risks and benefits, the                         patient was deemed in satisfactory condition to                         undergo the procedure. The anesthesia plan was to use                         monitored anesthesia care (MAC). Immediately prior to                         administration of medications, the patient was                         re-assessed for adequacy to receive sedatives. The                         heart rate, respiratory rate, oxygen saturations,                         blood pressure, adequacy of pulmonary ventilation, and                         response to care were monitored throughout the                         procedure. The physical status of the patient was                         re-assessed after the procedure.                        After obtaining informed consent, the colonoscope was                         passed under direct vision. Throughout the procedure,                         the patient's blood pressure, pulse, and oxygen  saturations were monitored continuously. The                         Colonoscope was introduced through the anus and                         advanced to the the terminal ileum, with                         identification of the appendiceal orifice and IC                         valve. The colonoscopy was performed without                         difficulty. The patient tolerated the procedure well.                         The quality of the bowel preparation was evaluated                         using the BBPS [facility] Bowel Preparation Scale) with                         scores of: Right Colon = 3, Transverse Colon = 3 and                         Left Colon = 3  (entire mucosa seen well with no                         residual staining, small fragments of stool or opaque                         liquid). The total BBPS score equals 9. The terminal                         ileum, ileocecal valve, appendiceal orifice, and                         rectum were photographed. Findings:      The perianal and digital rectal examinations were normal. Pertinent       negatives include normal sphincter tone.      The terminal ileum appeared normal.      The entire examined colon appeared normal.      A 1 to 2 mm polyp was found in the ascending colon. The polyp was       sessile. The polyp was removed with a cold biopsy forceps. Resection and       retrieval were complete. Estimated blood loss was minimal.      A 8 to 9 mm polyp was found in the ascending colon. The polyp was       sessile. The polyp was removed with a cold snare. Resection and       retrieval were complete. Estimated blood loss was minimal.      A 11 to 13 mm polyp was found in the ascending colon. The polyp was       sessile. The polyp was removed with a cold snare.  Resection and       retrieval were complete. To prevent bleeding after the polypectomy, two       hemostatic clips were successfully placed (MR conditional). There was no       bleeding at the end of the procedure. Estimated blood loss was minimal.      Scattered small-mouthed diverticula were found in the entire colon.       Estimated blood loss: none.      Non-bleeding internal hemorrhoids were found during retroflexion. The       hemorrhoids were Grade I (internal hemorrhoids that do not prolapse).       Estimated blood loss: none.      The exam was otherwise without abnormality on direct and retroflexion       views. Impression:            - The examined portion of the ileum was normal.                        - The entire examined colon is normal.                        - One 1 to 2 mm polyp in the ascending colon, removed                          with a cold biopsy forceps. Resected and retrieved.                        - One 8 to 9 mm polyp in the ascending colon, removed                         with a cold snare. Resected and retrieved.                        - One 11 to 13 mm polyp in the ascending colon,                         removed with a cold snare. Resected and retrieved.                         Clips (MR conditional) were placed.                        - Diverticulosis in the entire examined colon.                        - Non-bleeding internal hemorrhoids.                        - The examination was otherwise normal on direct and                         retroflexion views. Recommendation:        - Discharge patient to home.                        - Resume previous diet.                        - No aspirin, ibuprofen, naproxen,  or other                         non-steroidal anti-inflammatory drugs for 3 days after                         polyp removal.                        - Continue present medications.                        - Await pathology results.                        - Repeat colonoscopy in 1 year for surveillance after                         piecemeal polypectomy.                        - Return to referring physician as previously                         scheduled. Procedure Code(s):     --- Professional ---                        [phone], Colonoscopy, flexible; with removal of                         tumor(s), polyp(s), or other lesion(s) by snare                         technique                        45380, 77, Colonoscopy, flexible; with biopsy, single                         or multiple Diagnosis Code(s):     --- Professional ---                        Z86.010, Personal history of colonic polyps                        K64.0, First degree hemorrhoids                        K63.5, Polyp of colon                        K57.30, Diverticulosis of large intestine without                          perforation or abscess without bleeding CPT copyright 2019 [employer]. All rights reserved. The codes documented in this report are preliminary and upon coder review may  be revised to meet current compliance requirements. Attending Participation:      I personally performed the entire procedure. Volney American, DO Annamaria Helling DO, DO 05/31/2021 8:27:04 AM This report has been signed electronically. Number of Addenda: 0 Note Initiated On: 05/31/2021 7:34 AM Scope Withdrawal Time:  0 hours 27 minutes 41 seconds  Total Procedure Duration: 0 hours 37 minutes 58 seconds  Estimated Blood Loss:  Estimated blood loss was minimal.      [facility]

## 2021-06-01 ENCOUNTER — Encounter: Payer: Self-pay | Admitting: Gastroenterology

## 2021-06-01 LAB — SURGICAL PATHOLOGY

## 2021-06-20 ENCOUNTER — Ambulatory Visit
Admission: EM | Admit: 2021-06-20 | Discharge: 2021-06-20 | Disposition: A | Discharge status: Home / Self Care | Payer: BC Managed Care – PPO | Attending: Physician Assistant | Admitting: Physician Assistant

## 2021-06-20 ENCOUNTER — Encounter: Payer: Self-pay | Admitting: Emergency Medicine

## 2021-06-20 ENCOUNTER — Encounter: Payer: Self-pay | Admitting: Internal Medicine

## 2021-06-20 ENCOUNTER — Other Ambulatory Visit: Payer: Self-pay

## 2021-06-20 DIAGNOSIS — J069 Acute upper respiratory infection, unspecified: Secondary | ICD-10-CM

## 2021-06-20 MED ORDER — PROMETHAZINE-DM 6.25-15 MG/5ML PO SYRP: 5.0000 mL | ORAL_SOLUTION | Freq: Every evening | ORAL | 0 refills | Status: DC | PRN

## 2021-06-20 NOTE — ED Provider Notes (Signed)
[facility]    CSN: [id number] Arrival date & time: 06/20/21  1428      History   Chief Complaint Chief Complaint  Patient presents with   Cough    HPI Maria Munoz is a 55 y.o. female presenting for 5-day history of dry cough and voice hoarseness.  Denies fever, fatigue, body aches, sore throat, nasal congestion, postnasal drainage, chest pain, breathing difficulty or wheezing.  Denies any sick contacts and no known exposure to influenza or COVID-19.  Has been taking over-the-counter Mucinex.  Denies any improving or worsening in symptoms.  Patient is current everyday smoker.  No other complaints.  HPI  Past Medical History:  Diagnosis Date   Arthritis    Herpes    H/O   History of abnormal Pap smear    Hypercholesterolemia     Patient Active Problem List   Diagnosis Date Noted   Elevated alkaline phosphatase level 05/30/2019   Tobacco abuse 12/19/2018   History of chest pain 12/19/2018   Pendulous breast 05/31/2018   Vaginal bleeding 02/27/2017   History of colon polyps 10/22/2016   OA (osteoarthritis) of knee 05/13/2016   Health care maintenance 01/01/2015   Neck pain 12/19/2013   CTS (carpal tunnel syndrome) 12/19/2013   Herpes 07/15/2013   Knee pain 07/15/2013   Hypercholesterolemia 07/15/2013   Abnormal Pap smear of cervix 07/15/2013   Stress 07/15/2013    Past Surgical History:  Procedure Laterality Date   ANTERIOR CRUCIATE LIGAMENT REPAIR Left 2009   CERVICAL BIOPSY  W/ LOOP ELECTRODE EXCISION     COLONOSCOPY WITH PROPOFOL N/A 10/18/2016   Procedure: COLONOSCOPY WITH PROPOFOL;  Surgeon: Lollie Sails, MD;  Location: [facility] ENDOSCOPY;  Service: Endoscopy;  Laterality: N/A;   COLONOSCOPY WITH PROPOFOL N/A 05/31/2021   Procedure: COLONOSCOPY WITH PROPOFOL;  Surgeon: Annamaria Helling, DO;  Location: [facility] ENDOSCOPY;  Service: Gastroenterology;  Laterality: N/A;   JOINT REPLACEMENT     RT Knee   NOVASURE ABLATION     TOTAL KNEE  ARTHROPLASTY Right 05/13/2016   Procedure: RIGHT TOTAL KNEE ARTHROPLASTY;  Surgeon: Gaynelle Arabian, MD;  Location: [facility] ORS;  Service: Orthopedics;  Laterality: Right;   TYMPANOPLASTY      OB History   No obstetric history on file.      Home Medications    Prior to Admission medications   Medication Sig Start Date End Date Taking? Authorizing Provider  citalopram (CELEXA) 20 MG tablet Take 1 tablet (20 mg total) by mouth daily. 05/16/21  Yes Einar Pheasant, MD  promethazine-dextromethorphan (PROMETHAZINE-DM) 6.25-15 MG/5ML syrup Take 5 mLs by mouth at bedtime as needed for cough. 06/20/21  Yes Danton Clap, PA-C  valACYclovir (VALTREX) 500 MG tablet Take 1 tablet (500 mg total) by mouth daily. 11/16/20   Einar Pheasant, MD    Family History Family History  Problem Relation Age of Onset   Arthritis Mother    Hyperlipidemia Mother    Hypertension Mother    Cancer Father        prostate   Hyperlipidemia Father    Hypertension Father    Alcohol abuse Maternal Grandfather    Breast cancer Neg Hx     Social History Social History   Tobacco Use   Smoking status: Every Day    Packs/day: 0.25    Years: 3.00    Pack years: 0.75    Types: Cigarettes   Smokeless tobacco: Never   Tobacco comments:    2 cigarettes a day  Vaping Use   Vaping Use: Never used  Substance Use Topics   Alcohol use: Yes    Alcohol/week: 0.0 standard drinks    Comment: socially   Drug use: No     Allergies   Spinach and Ampicillin   Review of Systems Review of Systems  Constitutional:  Negative for chills, diaphoresis, fatigue and fever.  HENT:  Positive for voice change. Negative for congestion, ear pain, rhinorrhea, sinus pressure, sinus pain and sore throat.   Respiratory:  Positive for cough. Negative for shortness of breath.   Cardiovascular:  Negative for chest pain.  Gastrointestinal:  Negative for abdominal pain, nausea and vomiting.  Musculoskeletal:  Negative for arthralgias and  myalgias.  Skin:  Negative for rash.  Neurological:  Negative for weakness and headaches.  Hematological:  Negative for adenopathy.    Physical Exam Triage Vital Signs ED Triage Vitals  Enc Vitals Group     BP 06/20/21 1458 118/75     Pulse Rate 06/20/21 1458 69     Resp 06/20/21 1458 16     Temp 06/20/21 1458 98.3 F (36.8 C)     Temp Source 06/20/21 1458 Oral     SpO2 06/20/21 1458 98 %     Weight --      Height --      Head Circumference --      Peak Flow --      Pain Score 06/20/21 1457 2     Pain Loc --      Pain Edu? --      Excl. in [place]? --    No data found.  Updated Vital Signs BP 118/75   Pulse 69   Temp 98.3 F (36.8 C) (Oral)   Resp 16   SpO2 98%       Physical Exam Vitals and nursing note reviewed.  Constitutional:      General: She is not in acute distress.    Appearance: Normal appearance. She is not ill-appearing or toxic-appearing.  HENT:     Head: Normocephalic and atraumatic.     Nose: Nose normal.     Mouth/Throat:     Mouth: Mucous membranes are moist.     Pharynx: Oropharynx is clear.  Eyes:     General: No scleral icterus.       Right eye: No discharge.        Left eye: No discharge.     Conjunctiva/sclera: Conjunctivae normal.  Cardiovascular:     Rate and Rhythm: Normal rate and regular rhythm.     Heart sounds: Normal heart sounds.  Pulmonary:     Effort: Pulmonary effort is normal. No respiratory distress.     Breath sounds: Normal breath sounds.  Musculoskeletal:     Cervical back: Neck supple.  Skin:    General: Skin is dry.  Neurological:     General: No focal deficit present.     Mental Status: She is alert. Mental status is at baseline.     Motor: No weakness.     Gait: Gait normal.  Psychiatric:        Mood and Affect: Mood normal.        Behavior: Behavior normal.        Thought Content: Thought content normal.     UC Treatments / Results  Labs (all labs ordered are listed, but only abnormal results are  displayed) Labs Reviewed - No data to display  EKG   Radiology No results found.  Procedures  Procedures (including critical care time)  Medications Ordered in UC Medications - No data to display  Initial Impression / Assessment and Plan / UC Course  I have reviewed the triage vital signs and the nursing notes.  Pertinent labs & imaging results that were available during my care of the patient were reviewed by me and considered in my medical decision making (see chart for details).  55 year old female presenting for cough and voice hoarseness x5 days.  Cough is dry.  Vitals normal and stable and she is overall well-appearing.  No obvious voice hoarseness on exam.  Advised patient symptoms consistent with viral upper respiratory infection/laryngitis.  Supportive care encouraged increased rest and fluids.  Continue Mucinex DM during the day and I have sent Promethazine DM for her to take in the evening.  Advised her to follow-up if symptoms are worsening or persist after 10 days.  Final Clinical Impressions(s) / UC Diagnoses   Final diagnoses:  Viral URI with cough     Discharge Instructions      URI/COLD SYMPTOMS: Your exam today is consistent with a viral illness. Antibiotics are not indicated at this time. Use medications as directed, including cough syrup, nasal saline, and decongestants. Your symptoms should improve over the next few days and resolve within 7-10 days. Increase rest and fluids. F/u if symptoms worsen or predominate such as sore throat, ear pain, productive cough, shortness of breath, or if you develop high fevers or worsening fatigue over the next several days.       ED Prescriptions     Medication Sig Dispense Auth. Provider   promethazine-dextromethorphan (PROMETHAZINE-DM) 6.25-15 MG/5ML syrup Take 5 mLs by mouth at bedtime as needed for cough. 118 mL Danton Clap, PA-C      PDMP not reviewed this encounter.   Danton Clap, PA-C 06/20/21  1538

## 2021-06-20 NOTE — Discharge Instructions (Signed)
URI/COLD SYMPTOMS: Your exam today is consistent with a viral illness. Antibiotics are not indicated at this time. Use medications as directed, including cough syrup, nasal saline, and decongestants. Your symptoms should improve over the next few days and resolve within 7-10 days. Increase rest and fluids. F/u if symptoms worsen or predominate such as sore throat, ear pain, productive cough, shortness of breath, or if you develop high fevers or worsening fatigue over the next several days.    

## 2021-06-20 NOTE — ED Triage Notes (Signed)
PT reports cough and "hoarse voice" that started Friday. Denies sore throat.

## 2021-06-21 ENCOUNTER — Ambulatory Visit: Payer: Self-pay

## 2021-06-21 NOTE — Telephone Encounter (Signed)
Called patient to follow up. She did go to urgent care and was treated with cough meds. Confirmed doing better today. Will let me know if she needs anything,

## 2021-06-27 ENCOUNTER — Ambulatory Visit: Payer: BC Managed Care – PPO | Admitting: Internal Medicine

## 2021-06-27 ENCOUNTER — Other Ambulatory Visit: Payer: Self-pay

## 2021-06-27 ENCOUNTER — Encounter: Payer: Self-pay | Admitting: Internal Medicine

## 2021-06-27 DIAGNOSIS — Z86010 Personal history of colonic polyps: Secondary | ICD-10-CM

## 2021-06-27 DIAGNOSIS — E7800 Pure hypercholesterolemia, unspecified: Secondary | ICD-10-CM | POA: Diagnosis not present

## 2021-06-27 DIAGNOSIS — F439 Reaction to severe stress, unspecified: Secondary | ICD-10-CM

## 2021-06-27 DIAGNOSIS — R748 Abnormal levels of other serum enzymes: Secondary | ICD-10-CM

## 2021-06-27 DIAGNOSIS — Z720 Tobacco use: Secondary | ICD-10-CM

## 2021-06-27 DIAGNOSIS — R059 Cough, unspecified: Secondary | ICD-10-CM

## 2021-06-27 DIAGNOSIS — R87619 Unspecified abnormal cytological findings in specimens from cervix uteri: Secondary | ICD-10-CM

## 2021-06-27 NOTE — Progress Notes (Signed)
Patient ID: Maria Munoz, female   DOB: [birth year], 55 y.o.   MRN: [id number]   Subjective:    Patient ID: Maria Munoz, female    DOB: Mar 15, 1966, 55 y.o.   MRN: [id number]  This visit occurred during the SARS-CoV-2 public health emergency.  Safety protocols were in place, including screening questions prior to the visit, additional usage of staff PPE, and extensive cleaning of exam room while observing appropriate contact time as indicated for disinfecting solutions.   Patient here for a scheduled follow up.   Chief Complaint  Patient presents with   Depression   .   HPI She was evaluated in UC for cough and hoarseness. Symptoms started 06/15/21.  Diagnosed with viral URI.  Instructed to continue with mucinex and promethazine DM in the evening.  Home covid test negative.  No sore throat.  No sinus congestion or pressure.  No fever.  Still some cough and hoarseness.  Stress is better.  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  No chest pain or sob reported.  No abdominal pain or bowel change reported.    Past Medical History:  Diagnosis Date   Arthritis    Herpes    H/O   History of abnormal Pap smear    Hypercholesterolemia    Past Surgical History:  Procedure Laterality Date   ANTERIOR CRUCIATE LIGAMENT REPAIR Left 2009   CERVICAL BIOPSY  W/ LOOP ELECTRODE EXCISION     COLONOSCOPY WITH PROPOFOL N/A 10/18/2016   Procedure: COLONOSCOPY WITH PROPOFOL;  Surgeon: Lollie Sails, MD;  Location: [facility] ENDOSCOPY;  Service: Endoscopy;  Laterality: N/A;   COLONOSCOPY WITH PROPOFOL N/A 05/31/2021   Procedure: COLONOSCOPY WITH PROPOFOL;  Surgeon: Annamaria Helling, DO;  Location: [facility] ENDOSCOPY;  Service: Gastroenterology;  Laterality: N/A;   JOINT REPLACEMENT     RT Knee   NOVASURE ABLATION     TOTAL KNEE ARTHROPLASTY Right 05/13/2016   Procedure: RIGHT TOTAL KNEE ARTHROPLASTY;  Surgeon: Gaynelle Arabian, MD;  Location: [facility] ORS;  Service: Orthopedics;  Laterality: Right;   TYMPANOPLASTY     Family  History  Problem Relation Age of Onset   Arthritis Mother    Hyperlipidemia Mother    Hypertension Mother    Cancer Father        prostate   Hyperlipidemia Father    Hypertension Father    Alcohol abuse Maternal Grandfather    Breast cancer Neg Hx    Social History   Socioeconomic History   Marital status: Divorced    Spouse name: Not on file   Number of children: Not on file   Years of education: Not on file   Highest education level: Not on file  Occupational History   Not on file  Tobacco Use   Smoking status: Every Day    Packs/day: 0.25    Years: 3.00    Pack years: 0.75    Types: Cigarettes   Smokeless tobacco: Never   Tobacco comments:    2 cigarettes a day  Vaping Use   Vaping Use: Never used  Substance and Sexual Activity   Alcohol use: Yes    Alcohol/week: 0.0 standard drinks    Comment: socially   Drug use: No   Sexual activity: Not on file  Other Topics Concern   Not on file  Social History Narrative   Not on file   Social Determinants of Health   Financial Resource Strain: Not on file  Food Insecurity: Not on file  Transportation Needs:  Not on file  Physical Activity: Not on file  Stress: Not on file  Social Connections: Not on file     Review of Systems  Constitutional:  Negative for appetite change and unexpected weight change.  HENT:  Negative for congestion and sinus pressure.   Respiratory:  Positive for cough. Negative for chest tightness and shortness of breath.   Cardiovascular:  Negative for chest pain and palpitations.  Gastrointestinal:  Negative for abdominal pain, diarrhea, nausea and vomiting.  Genitourinary:  Negative for difficulty urinating and dysuria.  Musculoskeletal:  Negative for joint swelling and myalgias.  Skin:  Negative for color change and rash.  Neurological:  Negative for dizziness, light-headedness and headaches.  Psychiatric/Behavioral:  Negative for agitation and dysphoric mood.       Objective:      BP 116/70   Pulse 74   Temp 97.6 F (36.4 C)   Resp 16   Ht 5\' 5"  (1.651 m)   Wt 153 lb 12.8 oz (69.8 kg)   SpO2 96%   BMI 25.59 kg/m  Wt Readings from Last 3 Encounters:  06/27/21 153 lb 12.8 oz (69.8 kg)  05/31/21 150 lb (68 kg)  11/16/20 171 lb (77.6 kg)    Physical Exam Vitals reviewed.  Constitutional:      General: She is not in acute distress.    Appearance: Normal appearance.  HENT:     Head: Normocephalic and atraumatic.     Right Ear: External ear normal.     Left Ear: External ear normal.  Eyes:     General: No scleral icterus.       Right eye: No discharge.        Left eye: No discharge.     Conjunctiva/sclera: Conjunctivae normal.  Neck:     Thyroid: No thyromegaly.  Cardiovascular:     Rate and Rhythm: Normal rate and regular rhythm.  Pulmonary:     Effort: No respiratory distress.     Breath sounds: Normal breath sounds. No wheezing.  Abdominal:     General: Bowel sounds are normal.     Palpations: Abdomen is soft.     Tenderness: There is no abdominal tenderness.  Musculoskeletal:        General: No swelling or tenderness.     Cervical back: Neck supple. No tenderness.  Lymphadenopathy:     Cervical: No cervical adenopathy.  Skin:    Findings: No erythema or rash.  Neurological:     Mental Status: She is alert.  Psychiatric:        Mood and Affect: Mood normal.        Behavior: Behavior normal.     Outpatient Encounter Medications as of 06/27/2021  Medication Sig   citalopram (CELEXA) 20 MG tablet Take 1 tablet (20 mg total) by mouth daily.   valACYclovir (VALTREX) 500 MG tablet Take 1 tablet (500 mg total) by mouth daily.   [DISCONTINUED] promethazine-dextromethorphan (PROMETHAZINE-DM) 6.25-15 MG/5ML syrup Take 5 mLs by mouth at bedtime as needed for cough.   No facility-administered encounter medications on file as of 06/27/2021.     Lab Results  Component Value Date   WBC 7.3 11/16/2020   HGB 14.9 11/16/2020   HCT 43.3  11/16/2020   PLT 253.0 11/16/2020   GLUCOSE 104 (H) 11/16/2020   CHOL 179 11/16/2020   TRIG 87.0 11/16/2020   HDL 67.30 11/16/2020   LDLDIRECT 104.1 07/23/2013   LDLCALC 94 11/16/2020   ALT 22 11/16/2020   AST 17  11/16/2020   NA 141 11/16/2020   K 4.2 11/16/2020   CL 102 11/16/2020   CREATININE 0.59 11/16/2020   BUN 18 11/16/2020   CO2 30 11/16/2020   TSH 1.89 11/16/2020   INR 0.84 05/03/2016       Assessment & Plan:   Problem List Items Addressed This Visit     Abnormal Pap smear of cervix    Needs f/u pap smear.  ASCUS last pap.        Cough    Improved.  Continue mucinex.  Call with update.        Elevated alkaline phosphatase level    Follow liver panel.       History of colon polyps    Colonoscopy 05/31/21 -  A.  COLON POLYPS X 3, ASCENDING; COLD SNARE (2) AND COLD BIOPSY (1):  - TUBULAR ADENOMAS, MULTIPLE FRAGMENTS.  - NEGATIVE FOR HIGH-GRADE DYSPLASIA AND MALIGNANCY.   B.  RECTUM POLYP; COLD BIOPSY:  - HYPERPLASTIC POLYP.  - NEGATIVE FOR DYSPLASIA AND MALIGNANCY.       Hypercholesterolemia    The 10-year ASCVD risk score (Arnett DK, et al., 2019) is: 3.2%   Values used to calculate the score:     Age: 34 years     Sex: Female     Is Non-Hispanic African American: No     Diabetic: No     Tobacco smoker: Yes     Systolic Blood Pressure: 536 mmHg     Is BP treated: No     HDL Cholesterol: 67.3 mg/dL     Total Cholesterol: 179 mg/dL  Low cholesterol diet and exercise.  Follow lipid panel.       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       Tobacco abuse    Have discussed the need to stop smoking.  Follow.         Einar Pheasant, MD

## 2021-07-08 ENCOUNTER — Telehealth: Payer: Self-pay | Admitting: Internal Medicine

## 2021-07-08 ENCOUNTER — Encounter: Payer: Self-pay | Admitting: Internal Medicine

## 2021-07-08 DIAGNOSIS — R059 Cough, unspecified: Secondary | ICD-10-CM | POA: Insufficient documentation

## 2021-07-08 NOTE — Assessment & Plan Note (Signed)
Have discussed the need to stop smoking.  Follow.

## 2021-07-08 NOTE — Telephone Encounter (Signed)
Would like to recheck pap smear in a couple of months.  Previous ASCUS noted on pap.  See result note from 10/2020.

## 2021-07-08 NOTE — Assessment & Plan Note (Signed)
Colonoscopy 05/31/21 -  A. COLON POLYPS X 3, ASCENDING; COLD SNARE (2) AND COLD BIOPSY (1):  - TUBULAR ADENOMAS, MULTIPLE FRAGMENTS.  - NEGATIVE FOR HIGH-GRADE DYSPLASIA AND MALIGNANCY.   B. RECTUM POLYP; COLD BIOPSY:  - HYPERPLASTIC POLYP.  - NEGATIVE FOR DYSPLASIA AND MALIGNANCY.

## 2021-07-08 NOTE — Assessment & Plan Note (Signed)
Needs f/u pap smear.  ASCUS last pap.

## 2021-07-08 NOTE — Assessment & Plan Note (Signed)
Improved.  Continue mucinex.  Call with update.

## 2021-07-08 NOTE — Assessment & Plan Note (Signed)
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

## 2021-07-08 NOTE — Assessment & Plan Note (Signed)
Follow liver panel.  

## 2021-07-08 NOTE — Assessment & Plan Note (Signed)
The 10-year ASCVD risk score (Arnett DK, et al., 2019) is: 3.2%   Values used to calculate the score:     Age: 54 years     Sex: Female     Is Non-Hispanic African American: No     Diabetic: No     Tobacco smoker: Yes     Systolic Blood Pressure: 076 mmHg     Is BP treated: No     HDL Cholesterol: 67.3 mg/dL     Total Cholesterol: 179 mg/dL  Low cholesterol diet and exercise.  Follow lipid panel.

## 2021-07-20 ENCOUNTER — Other Ambulatory Visit: Payer: Self-pay

## 2021-07-20 MED ORDER — VALACYCLOVIR HCL 500 MG PO TABS: 500.0000 mg | ORAL_TABLET | Freq: Every day | ORAL | 5 refills | Status: DC

## 2021-07-20 MED ORDER — CITALOPRAM HYDROBROMIDE 20 MG PO TABS: 20.0000 mg | ORAL_TABLET | Freq: Every day | ORAL | 1 refills | Status: DC

## 2021-07-20 NOTE — Telephone Encounter (Signed)
Spoke with patient and scheduled follow up with pap smear

## 2021-08-07 ENCOUNTER — Ambulatory Visit: Payer: BC Managed Care – PPO | Admitting: Internal Medicine

## 2021-08-10 ENCOUNTER — Ambulatory Visit: Payer: BC Managed Care – PPO | Admitting: Internal Medicine

## 2021-08-10 ENCOUNTER — Other Ambulatory Visit (HOSPITAL_COMMUNITY)
Admission: RE | Admit: 2021-08-10 | Discharge: 2021-08-10 | Disposition: A | Discharge status: Home / Self Care | Payer: BC Managed Care – PPO | Source: Ambulatory Visit | Attending: Internal Medicine | Admitting: Internal Medicine

## 2021-08-10 ENCOUNTER — Encounter: Payer: Self-pay | Admitting: Internal Medicine

## 2021-08-10 ENCOUNTER — Other Ambulatory Visit: Payer: Self-pay

## 2021-08-10 VITALS — BP 104/72 | HR 68 | Temp 97.6°F | Resp 16 | Ht 65.0 in | Wt 151.4 lb

## 2021-08-10 DIAGNOSIS — Z86010 Personal history of colonic polyps: Secondary | ICD-10-CM

## 2021-08-10 DIAGNOSIS — R87610 Atypical squamous cells of undetermined significance on cytologic smear of cervix (ASC-US): Secondary | ICD-10-CM | POA: Insufficient documentation

## 2021-08-10 DIAGNOSIS — R87619 Unspecified abnormal cytological findings in specimens from cervix uteri: Secondary | ICD-10-CM

## 2021-08-10 DIAGNOSIS — E7800 Pure hypercholesterolemia, unspecified: Secondary | ICD-10-CM | POA: Diagnosis not present

## 2021-08-10 DIAGNOSIS — F439 Reaction to severe stress, unspecified: Secondary | ICD-10-CM

## 2021-08-10 DIAGNOSIS — R748 Abnormal levels of other serum enzymes: Secondary | ICD-10-CM

## 2021-08-10 NOTE — Progress Notes (Signed)
Patient ID: Deno Etienne, female   DOB: 10-13-1965, 55 y.o.   MRN: [id number]   Subjective:    Patient ID: Deno Etienne, female    DOB: 11-30-1965, 54 y.o.   MRN: [id number]  This visit occurred during the SARS-CoV-2 public health emergency.  Safety protocols were in place, including screening questions prior to the visit, additional usage of staff PPE, and extensive cleaning of exam room while observing appropriate contact time as indicated for disinfecting solutions.  Has adjusted diet.  Lost weight.    Patient here for a scheduled follow up.  Chief Complaint  Patient presents with   Follow-up    With Pap   .   HPI Here to follow up - increased stress.  Discussed.  Stress with her mother's health issues.  Now has some help with [employer].  Home health PT and OT starting as well.  On citalopram.  She does not feel needs anything more at this time.  Stays active.  No chest pain or sob reported.  No acid reflux or abdominal pain reported.     Past Medical History:  Diagnosis Date   Arthritis    Herpes    H/O   History of abnormal Pap smear    Hypercholesterolemia    Past Surgical History:  Procedure Laterality Date   ANTERIOR CRUCIATE LIGAMENT REPAIR Left 2009   CERVICAL BIOPSY  W/ LOOP ELECTRODE EXCISION     COLONOSCOPY WITH PROPOFOL N/A 10/18/2016   Procedure: COLONOSCOPY WITH PROPOFOL;  Surgeon: Lollie Sails, MD;  Location: [facility] ENDOSCOPY;  Service: Endoscopy;  Laterality: N/A;   COLONOSCOPY WITH PROPOFOL N/A 05/31/2021   Procedure: COLONOSCOPY WITH PROPOFOL;  Surgeon: Annamaria Helling, DO;  Location: [facility] ENDOSCOPY;  Service: Gastroenterology;  Laterality: N/A;   JOINT REPLACEMENT     RT Knee   NOVASURE ABLATION     TOTAL KNEE ARTHROPLASTY Right 05/13/2016   Procedure: RIGHT TOTAL KNEE ARTHROPLASTY;  Surgeon: Gaynelle Arabian, MD;  Location: [facility] ORS;  Service: Orthopedics;  Laterality: Right;   TYMPANOPLASTY     Family History  Problem Relation Age of Onset    Arthritis Mother    Hyperlipidemia Mother    Hypertension Mother    Cancer Father        prostate   Hyperlipidemia Father    Hypertension Father    Alcohol abuse Maternal Grandfather    Breast cancer Neg Hx    Social History   Socioeconomic History   Marital status: Divorced    Spouse name: Not on file   Number of children: Not on file   Years of education: Not on file   Highest education level: Not on file  Occupational History   Not on file  Tobacco Use   Smoking status: Every Day    Packs/day: 0.25    Years: 3.00    Pack years: 0.75    Types: Cigarettes   Smokeless tobacco: Never   Tobacco comments:    2 cigarettes a day  Vaping Use   Vaping Use: Never used  Substance and Sexual Activity   Alcohol use: Yes    Alcohol/week: 0.0 standard drinks    Comment: socially   Drug use: No   Sexual activity: Not on file  Other Topics Concern   Not on file  Social History Narrative   Not on file   Social Determinants of Health   Financial Resource Strain: Not on file  Food Insecurity: Not on file  Transportation Needs:  Not on file  Physical Activity: Not on file  Stress: Not on file  Social Connections: Not on file     Review of Systems  Constitutional:  Negative for appetite change and unexpected weight change.  HENT:  Negative for congestion and sinus pressure.   Respiratory:  Negative for cough, chest tightness and shortness of breath.   Cardiovascular:  Negative for chest pain, palpitations and leg swelling.  Gastrointestinal:  Negative for abdominal pain, diarrhea, nausea and vomiting.  Genitourinary:  Negative for difficulty urinating and dysuria.  Musculoskeletal:  Negative for joint swelling and myalgias.  Skin:  Negative for color change and rash.  Neurological:  Negative for dizziness, light-headedness and headaches.  Psychiatric/Behavioral:  Negative for agitation and dysphoric mood.        Increased stress as outlined.       Objective:     BP  104/72   Pulse 68   Temp 97.6 F (36.4 C) (Oral)   Resp 16   Ht _0  (1.651 m)   Wt 151 lb 6 oz (68.7 kg)   SpO2 99%   BMI 25.19 kg/m  Wt Readings from Last 3 Encounters:  08/10/21 151 lb 6 oz (68.7 kg)  06/27/21 153 lb 12.8 oz (69.8 kg)  05/31/21 150 lb (68 kg)    Physical Exam Vitals reviewed.  Constitutional:      General: She is not in acute distress.    Appearance: Normal appearance.  HENT:     Head: Normocephalic and atraumatic.     Right Ear: External ear normal.     Left Ear: External ear normal.  Eyes:     General: No scleral icterus.       Right eye: No discharge.        Left eye: No discharge.     Conjunctiva/sclera: Conjunctivae normal.  Neck:     Thyroid: No thyromegaly.  Cardiovascular:     Rate and Rhythm: Normal rate and regular rhythm.  Pulmonary:     Effort: No respiratory distress.     Breath sounds: Normal breath sounds. No wheezing.  Abdominal:     General: Bowel sounds are normal.     Palpations: Abdomen is soft.     Tenderness: There is no abdominal tenderness.  Genitourinary:    Comments: Normal external genitalia.  Vaginal vault without lesions.  Cervix identified.  Pap smear performed.  Could not appreciate any adnexal masses or tenderness.   Musculoskeletal:        General: No swelling or tenderness.     Cervical back: Neck supple. No tenderness.  Lymphadenopathy:     Cervical: No cervical adenopathy.  Skin:    Findings: No erythema or rash.  Neurological:     Mental Status: She is alert.  Psychiatric:        Mood and Affect: Mood normal.        Behavior: Behavior normal.     Outpatient Encounter Medications as of 08/10/2021  Medication Sig   citalopram (CELEXA) 20 MG tablet Take 1 tablet (20 mg total) by mouth daily.   valACYclovir (VALTREX) 500 MG tablet Take 1 tablet (500 mg total) by mouth daily.   No facility-administered encounter medications on file as of 08/10/2021.     Lab Results  Component Value Date   WBC 7.3  11/16/2020   HGB 14.9 11/16/2020   HCT 43.3 11/16/2020   PLT 253.0 11/16/2020   GLUCOSE 104 (H) 11/16/2020   CHOL 179 11/16/2020   TRIG  87.0 11/16/2020   HDL 67.30 11/16/2020   LDLDIRECT 104.1 07/23/2013   LDLCALC 94 11/16/2020   ALT 22 11/16/2020   AST 17 11/16/2020   NA 141 11/16/2020   K 4.2 11/16/2020   CL 102 11/16/2020   CREATININE 0.59 11/16/2020   BUN 18 11/16/2020   CO2 30 11/16/2020   TSH 1.89 11/16/2020   INR 0.84 05/03/2016       Assessment & Plan:   Problem List Items Addressed This Visit     Abnormal Pap smear of cervix    ASCUS on last pap. Repeat today.       Elevated alkaline phosphatase level    Liver panel wnl 10/2020 (including alk phos).       History of colon polyps    Colonoscopy 05/2021 as outlined.        Hypercholesterolemia    The 10-year ASCVD risk score (Arnett DK, et al., 2019) is: 2.6%   Values used to calculate the score:     Age: 63 years     Sex: Female     Is Non-Hispanic African American: No     Diabetic: No     Tobacco smoker: Yes     Systolic Blood Pressure: 382 mmHg     Is BP treated: No     HDL Cholesterol: 67.3 mg/dL     Total Cholesterol: 179 mg/dL  Low cholesterol diet and exercise.  Follow lipid panel.       Stress    Increased stress as outlined.  Discussed.  Remains on citalopram.  Does not feel needs anything more at this time.  Follow.        Other Visit Diagnoses     ASCUS of cervix with negative high risk HPV    -  Primary   Relevant Orders   Cytology - PAP( [place])        Einar Pheasant, MD

## 2021-08-11 ENCOUNTER — Encounter: Payer: Self-pay | Admitting: Internal Medicine

## 2021-08-11 NOTE — Assessment & Plan Note (Signed)
Liver panel wnl 10/2020 (including alk phos).

## 2021-08-11 NOTE — Assessment & Plan Note (Signed)
The 10-year ASCVD risk score (Arnett DK, et al., 2019) is: 2.6%   Values used to calculate the score:     Age: 55 years     Sex: Female     Is Non-Hispanic African American: No     Diabetic: No     Tobacco smoker: Yes     Systolic Blood Pressure: 161 mmHg     Is BP treated: No     HDL Cholesterol: 67.3 mg/dL     Total Cholesterol: 179 mg/dL  Low cholesterol diet and exercise.  Follow lipid panel.

## 2021-08-11 NOTE — Assessment & Plan Note (Signed)
Increased stress as outlined.  Discussed.  Remains on citalopram.  Does not feel needs anything more at this time.  Follow.

## 2021-08-11 NOTE — Assessment & Plan Note (Signed)
ASCUS on last pap. Repeat today.

## 2021-08-11 NOTE — Assessment & Plan Note (Signed)
Colonoscopy 05/2021 as outlined.

## 2021-08-17 LAB — CYTOLOGY - PAP
Comment: NEGATIVE
Diagnosis: UNDETERMINED — AB
High risk HPV: NEGATIVE

## 2021-08-23 ENCOUNTER — Other Ambulatory Visit: Payer: Self-pay | Admitting: Internal Medicine

## 2021-08-23 DIAGNOSIS — R87619 Unspecified abnormal cytological findings in specimens from cervix uteri: Secondary | ICD-10-CM

## 2021-08-23 NOTE — Progress Notes (Signed)
Order placed for gyn referral.  

## 2021-09-12 ENCOUNTER — Encounter: Payer: Self-pay | Admitting: Obstetrics & Gynecology

## 2021-09-12 ENCOUNTER — Other Ambulatory Visit: Payer: Self-pay

## 2021-09-12 ENCOUNTER — Ambulatory Visit (INDEPENDENT_AMBULATORY_CARE_PROVIDER_SITE_OTHER): Payer: BC Managed Care – PPO | Admitting: Obstetrics & Gynecology

## 2021-09-12 ENCOUNTER — Other Ambulatory Visit (HOSPITAL_COMMUNITY)
Admission: RE | Admit: 2021-09-12 | Discharge: 2021-09-12 | Disposition: A | Discharge status: Home / Self Care | Payer: BC Managed Care – PPO | Source: Ambulatory Visit | Attending: Obstetrics & Gynecology | Admitting: Obstetrics & Gynecology

## 2021-09-12 VITALS — BP 120/80 | Ht 66.0 in | Wt 156.0 lb

## 2021-09-12 DIAGNOSIS — R87610 Atypical squamous cells of undetermined significance on cytologic smear of cervix (ASC-US): Secondary | ICD-10-CM | POA: Diagnosis not present

## 2021-09-12 NOTE — Progress Notes (Signed)
Referring Provider:  Dr Ronda Fairly  HPI:  Maria Munoz is a 56 y.o.  No obstetric history on file.  who presents today for evaluation and management of abnormal cervical cytology.    Dysplasia History:  ASCUS x2.  HPV Neg  ROS:  Pertinent items are noted in HPI.  OB History  No obstetric history on file.    Past Medical History:  Diagnosis Date   Arthritis    Herpes    H/O   History of abnormal Pap smear    Hypercholesterolemia     Past Surgical History:  Procedure Laterality Date   ANTERIOR CRUCIATE LIGAMENT REPAIR Left 2009   CERVICAL BIOPSY  W/ LOOP ELECTRODE EXCISION     COLONOSCOPY WITH PROPOFOL N/A 10/18/2016   Procedure: COLONOSCOPY WITH PROPOFOL;  Surgeon: Lollie Sails, MD;  Location: [facility] ENDOSCOPY;  Service: Endoscopy;  Laterality: N/A;   COLONOSCOPY WITH PROPOFOL N/A 05/31/2021   Procedure: COLONOSCOPY WITH PROPOFOL;  Surgeon: Annamaria Helling, DO;  Location: [facility] ENDOSCOPY;  Service: Gastroenterology;  Laterality: N/A;   JOINT REPLACEMENT     RT Knee   NOVASURE ABLATION     TOTAL KNEE ARTHROPLASTY Right 05/13/2016   Procedure: RIGHT TOTAL KNEE ARTHROPLASTY;  Surgeon: Gaynelle Arabian, MD;  Location: [facility] ORS;  Service: Orthopedics;  Laterality: Right;   TYMPANOPLASTY      SOCIAL HISTORY:  Social History   Substance and Sexual Activity  Alcohol Use Yes   Alcohol/week: 0.0 standard drinks   Comment: socially    Social History   Substance and Sexual Activity  Drug Use No     Family History  Problem Relation Age of Onset   Arthritis Mother    Hyperlipidemia Mother    Hypertension Mother    Cancer Father        prostate   Hyperlipidemia Father    Hypertension Father    Alcohol abuse Maternal Grandfather    Breast cancer Neg Hx     ALLERGIES:  Spinach and Ampicillin  Current Outpatient Medications on File Prior to Visit  Medication Sig Dispense Refill   citalopram (CELEXA) 20 MG tablet Take 1 tablet (20 mg total) by mouth daily. 90 tablet  1   valACYclovir (VALTREX) 500 MG tablet Take 1 tablet (500 mg total) by mouth daily. 30 tablet 5   No current facility-administered medications on file prior to visit.    Physical Exam: -Vitals:  BP 120/80    Ht 5\' 6"  (1.676 m)    Wt 156 lb (70.8 kg)    BMI 25.18 kg/m  GEN: WD, WN, NAD.  A+ O x 3, good mood and affect. ABD:  NT, ND.  Soft, no masses.  No hernias noted.   Pelvic:   Vulva: Normal appearance.  No lesions.  Vagina: No lesions or abnormalities noted.  Support: Normal pelvic support.  Urethra No masses tenderness or scarring.  Meatus Normal size without lesions or prolapse.  Cervix: See below.  Anus: Normal exam.  No lesions.  Perineum: Normal exam.  No lesions.        Bimanual   Uterus: Normal size.  Non-tender.  Mobile.  AV.  Adnexae: No masses.  Non-tender to palpation.  Cul-de-sac: Negative for abnormality.   PROCEDURE: 1.  Urine Pregnancy Test:  not done 2.  Colposcopy performed with 4% acetic acid after verbal consent obtained                                         -  Aceto-white Lesions Location(s): none.              -Biopsy performed at 6, 12 o'clock               -ECC indicated and performed: Yes.       -Biopsy sites made hemostatic with pressure, AgNO3, and/or Monsel's solution   -Satisfactory colposcopy: Yes.      -Evidence of Invasive cervical CA :  NO  ASSESSMENT:  Maria Munoz is a 56 y.o. No obstetric history on file. here for  1. ASCUS of cervix with negative high risk HPV   .  PLAN: 1.  I discussed the grading system of pap smears and HPV high risk viral types.  We will discuss and base management after colpo results return. 2. Follow up PAP 6 months, vs intervention if high grade dysplasia identified      Barnett Applebaum, MD, [facility] 09/12/2021  2:12 PM

## 2021-09-12 NOTE — Patient Instructions (Signed)
Colposcopy, Care After ?The following information offers guidance on how to care for yourself after your procedure. Your health care provider may also give you more specific instructions. If you have problems or questions, contact your health care provider. ?What can I expect after the procedure? ?If you had a colposcopy without a biopsy, you can expect to feel fine right away after your procedure. However, you may have some spotting of blood for a few days. You can return to your normal activities. ?If you had a colposcopy with a biopsy, it is common after the procedure to have: ?Soreness and mild pain. These may last for a few days. ?Mild vaginal bleeding or discharge that is dark-colored and grainy. This may last for a few days. The discharge may be caused by a liquid (solution) that was used during the procedure. You may need to wear a sanitary pad during this time. ?Spotting of blood for at least 48 hours after the procedure. ?Follow these instructions at home: ?Medicines ?Take over-the-counter and prescription medicines only as told by your health care provider. ?Talk with your health care provider about what type of over-the-counter pain medicines and prescription medicines you can start to take again. It is especially important to talk with your health care provider if you take blood thinners. ?Activity ?Avoid using douche products, using tampons, and having sex for at least 3 days after the procedure or for as long as told by your health care provider. ?Return to your normal activities as told by your health care provider. Ask your health care provider what activities are safe for you. ?General instructions ?Ask your health care provider if you may take baths, swim, or use a hot tub. You may take showers. ?If you use birth control (contraception), continue to use it. ?Keep all follow-up visits. This is important. ?Contact a health care provider if: ?You have a fever or chills. ?You faint or feel  light-headed. ?Get help right away if: ?You have heavy bleeding from your vagina or pass blood clots. Heavy bleeding is bleeding that soaks through a sanitary pad in less than 1 hour. ?You have vaginal discharge that is abnormal, is yellow in color, or smells bad. This could be a sign of infection. ?You have severe pain or cramps in your lower abdomen that do not go away with medicine. ?Summary ?If you had a colposcopy without a biopsy, you can expect to feel fine right away, but you may have some spotting of blood for a few days. You can return to your normal activities. ?If you had a colposcopy with a biopsy, it is common to have mild pain for a few days and spotting for 48 hours after the procedure. ?Avoid using douche products, using tampons, and having sex for at least 3 days after the procedure or for as long as told by your health care provider. ?Get help right away if you have heavy bleeding, severe pain, or signs of infection. ?This information is not intended to replace advice given to you by your health care provider. Make sure you discuss any questions you have with your health care provider. ?Document Revised: 01/14/2021 Document Reviewed: 01/14/2021 ?Elsevier Patient Education ? 2022 Elsevier Inc. ? ?

## 2021-09-14 LAB — SURGICAL PATHOLOGY

## 2021-10-30 ENCOUNTER — Encounter: Payer: Self-pay | Admitting: Internal Medicine

## 2021-12-11 ENCOUNTER — Ambulatory Visit: Payer: BC Managed Care – PPO | Admitting: Internal Medicine

## 2021-12-11 ENCOUNTER — Encounter: Payer: Self-pay | Admitting: Internal Medicine

## 2021-12-11 ENCOUNTER — Telehealth: Payer: Self-pay

## 2021-12-11 VITALS — BP 120/70 | HR 67 | Temp 98.4°F | Resp 12 | Ht 66.0 in | Wt 157.2 lb

## 2021-12-11 DIAGNOSIS — Z0000 Encounter for general adult medical examination without abnormal findings: Secondary | ICD-10-CM

## 2021-12-11 DIAGNOSIS — R748 Abnormal levels of other serum enzymes: Secondary | ICD-10-CM

## 2021-12-11 DIAGNOSIS — Z1231 Encounter for screening mammogram for malignant neoplasm of breast: Secondary | ICD-10-CM

## 2021-12-11 DIAGNOSIS — Z1159 Encounter for screening for other viral diseases: Secondary | ICD-10-CM

## 2021-12-11 DIAGNOSIS — Z86010 Personal history of colonic polyps: Secondary | ICD-10-CM | POA: Diagnosis not present

## 2021-12-11 DIAGNOSIS — E7800 Pure hypercholesterolemia, unspecified: Secondary | ICD-10-CM | POA: Diagnosis not present

## 2021-12-11 DIAGNOSIS — Z720 Tobacco use: Secondary | ICD-10-CM

## 2021-12-11 DIAGNOSIS — R87619 Unspecified abnormal cytological findings in specimens from cervix uteri: Secondary | ICD-10-CM

## 2021-12-11 DIAGNOSIS — Z114 Encounter for screening for human immunodeficiency virus [HIV]: Secondary | ICD-10-CM

## 2021-12-11 DIAGNOSIS — F439 Reaction to severe stress, unspecified: Secondary | ICD-10-CM

## 2021-12-11 DIAGNOSIS — R0981 Nasal congestion: Secondary | ICD-10-CM

## 2021-12-11 NOTE — Assessment & Plan Note (Signed)
Recheck liver panel today.  

## 2021-12-11 NOTE — Assessment & Plan Note (Signed)
Previous ASCUS x 2.  Saw gyn.  S/p colposcopy (09/12/21) - negative.  Recommended f/u pap in 6 months.  ?

## 2021-12-11 NOTE — Assessment & Plan Note (Signed)
The 10-year ASCVD risk score (Arnett DK, et al., 2019) is: 3.7% ?  Values used to calculate the score: ?    Age: 56 years ?    Sex: Female ?    Is Non-Hispanic African American: No ?    Diabetic: No ?    Tobacco smoker: Yes ?    Systolic Blood Pressure: 818 mmHg ?    Is BP treated: No ?    HDL Cholesterol: 67.3 mg/dL ?    Total Cholesterol: 179 mg/dL  ?Low cholesterol diet and exercise.  Follow lipid panel.  Check lipid panel today.  ?

## 2021-12-11 NOTE — Assessment & Plan Note (Addendum)
Colonoscopy 05/2021 as outlined.  Follow up colonoscopy one year.  ?

## 2021-12-11 NOTE — Assessment & Plan Note (Signed)
Physical today 12/11/21.  Colposcopy 09/12/21 - negative.  Recommended f/u 6 months.  Colonoscopy 05/2021 - recommended f/u in one year.   ?

## 2021-12-11 NOTE — Patient Instructions (Signed)
Saline nasal spray - flush nose at least 2-3x/day ? ?Nasacort nasal spray - 2 sprays each nostril one time per day.  Do this in the evening.  ?

## 2021-12-11 NOTE — Progress Notes (Signed)
Patient ID: Maria Munoz, female   DOB: 24-Feb-1966, 56 y.o.   MRN: [id number] ? ? ?Subjective:  ? ? Patient ID: Maria Munoz, female    DOB: October 02, 1965, 56 y.o.   MRN: [id number] ? ?This visit occurred during the SARS-CoV-2 public health emergency.  Safety protocols were in place, including screening questions prior to the visit, additional usage of staff PPE, and extensive cleaning of exam room while observing appropriate contact time as indicated for disinfecting solutions.  ? ?Patient here for her physical exam.  ?  ? ?HPI ?Doing relatively well.  On citalopram.  Handling stress.  Does not feel needs any further intervention.  No chest pain or sob reported.  No abdominal pain.  Bowels moving.  Had colonoscopy 05/2021.  Recommended f/u in one year. Had sinus infection two weeks ago.  Better.  Has noticed over the last several days, possibly some return of symptoms.  No sinus pressure.  Does report discomfort - left posterior - occipital region.  No neck pain.  Notices more at night.  Discussed pillow and sleep.  She was questioning if related to her sinus.  No headache.  No dizziness.  Not constant pain.  ? ? ?Past Medical History:  ?Diagnosis Date  ? Arthritis   ? Herpes   ? H/O  ? History of abnormal Pap smear   ? Hypercholesterolemia   ? ?Past Surgical History:  ?Procedure Laterality Date  ? ANTERIOR CRUCIATE LIGAMENT REPAIR Left 2009  ? CERVICAL BIOPSY  W/ LOOP ELECTRODE EXCISION    ? COLONOSCOPY WITH PROPOFOL N/A 10/18/2016  ? Procedure: COLONOSCOPY WITH PROPOFOL;  Surgeon: Lollie Sails, MD;  Location: [facility] ENDOSCOPY;  Service: Endoscopy;  Laterality: N/A;  ? COLONOSCOPY WITH PROPOFOL N/A 05/31/2021  ? Procedure: COLONOSCOPY WITH PROPOFOL;  Surgeon: Annamaria Helling, DO;  Location: [facility] ENDOSCOPY;  Service: Gastroenterology;  Laterality: N/A;  ? JOINT REPLACEMENT    ? RT Knee  ? NOVASURE ABLATION    ? TOTAL KNEE ARTHROPLASTY Right 05/13/2016  ? Procedure: RIGHT TOTAL KNEE ARTHROPLASTY;  Surgeon: Gaynelle Arabian, MD;  Location: [facility] ORS;  Service: Orthopedics;  Laterality: Right;  ? TYMPANOPLASTY    ? ?Family History  ?Problem Relation Age of Onset  ? Arthritis Mother   ? Hyperlipidemia Mother   ? Hypertension Mother   ? Cancer Father   ?     prostate  ? Hyperlipidemia Father   ? Hypertension Father   ? Alcohol abuse Maternal Grandfather   ? Breast cancer Neg Hx   ? ?Social History  ? ?Socioeconomic History  ? Marital status: Divorced  ?  Spouse name: Not on file  ? Number of children: Not on file  ? Years of education: Not on file  ? Highest education level: Not on file  ?Occupational History  ? Not on file  ?Tobacco Use  ? Smoking status: Every Day  ?  Packs/day: 0.25  ?  Years: 3.00  ?  Pack years: 0.75  ?  Types: Cigarettes  ? Smokeless tobacco: Never  ? Tobacco comments:  ?  2 cigarettes a day  ?Vaping Use  ? Vaping Use: Never used  ?Substance and Sexual Activity  ? Alcohol use: Yes  ?  Alcohol/week: 0.0 standard drinks  ?  Comment: socially  ? Drug use: No  ? Sexual activity: Not on file  ?Other Topics Concern  ? Not on file  ?Social History Narrative  ? Not on file  ? ?Social Determinants of  Health  ? ?Financial Resource Strain: Not on file  ?Food Insecurity: Not on file  ?Transportation Needs: Not on file  ?Physical Activity: Not on file  ?Stress: Not on file  ?Social Connections: Not on file  ? ? ? ?Review of Systems  ?Constitutional:  Negative for appetite change and unexpected weight change.  ?HENT:  Positive for congestion and postnasal drip. Negative for sinus pressure.   ?Eyes:  Negative for pain and visual disturbance.  ?Respiratory:  Negative for cough, chest tightness and shortness of breath.   ?Cardiovascular:  Negative for chest pain, palpitations and leg swelling.  ?Gastrointestinal:  Negative for abdominal pain, diarrhea, nausea and vomiting.  ?Genitourinary:  Negative for difficulty urinating and dysuria.  ?Musculoskeletal:  Negative for joint swelling and myalgias.  ?Skin:  Negative for color  change and rash.  ?Neurological:  Negative for dizziness, light-headedness and headaches.  ?Hematological:  Negative for adenopathy. Does not bruise/bleed easily.  ?Psychiatric/Behavioral:  Negative for agitation and dysphoric mood.   ? ?   ?Objective:  ?  ? ?BP 120/70 (BP Location: Left Arm, Patient Position: Sitting, Cuff Size: Small)   Pulse 67   Temp 98.4 ?F (36.9 ?C) (Oral)   Resp 12   Ht '5\' 6"'$  (1.676 m)   Wt 157 lb 3.2 oz (71.3 kg)   SpO2 97%   BMI 25.37 kg/m?  ?Wt Readings from Last 3 Encounters:  ?12/11/21 157 lb 3.2 oz (71.3 kg)  ?09/12/21 156 lb (70.8 kg)  ?08/10/21 151 lb 6 oz (68.7 kg)  ? ? ?Physical Exam ?Vitals reviewed.  ?Constitutional:   ?   General: She is not in acute distress. ?   Appearance: Normal appearance. She is well-developed.  ?HENT:  ?   Head: Normocephalic and atraumatic.  ?   Right Ear: Ear canal and external ear normal.  ?   Left Ear: Ear canal and external ear normal.  ?   Nose: Nose normal.  ?Eyes:  ?   General: No scleral icterus.    ?   Right eye: No discharge.     ?   Left eye: No discharge.  ?   Conjunctiva/sclera: Conjunctivae normal.  ?Neck:  ?   Thyroid: No thyromegaly.  ?Cardiovascular:  ?   Rate and Rhythm: Normal rate and regular rhythm.  ?Pulmonary:  ?   Effort: No tachypnea, accessory muscle usage or respiratory distress.  ?   Breath sounds: Normal breath sounds. No decreased breath sounds or wheezing.  ?Chest:  ?Breasts: ?   Right: No inverted nipple, mass, nipple discharge or tenderness (no axillary adenopathy).  ?   Left: No inverted nipple, mass, nipple discharge or tenderness (no axilarry adenopathy).  ?Abdominal:  ?   General: Bowel sounds are normal.  ?   Palpations: Abdomen is soft.  ?   Tenderness: There is no abdominal tenderness.  ?Musculoskeletal:     ?   General: No swelling or tenderness.  ?   Cervical back: Neck supple.  ?Lymphadenopathy:  ?   Cervical: No cervical adenopathy.  ?Skin: ?   Findings: No erythema or rash.  ?Neurological:  ?   Mental  Status: She is alert and oriented to person, place, and time.  ?Psychiatric:     ?   Mood and Affect: Mood normal.     ?   Behavior: Behavior normal.  ? ? ? ?Outpatient Encounter Medications as of 12/11/2021  ?Medication Sig  ? citalopram (CELEXA) 20 MG tablet Take 1 tablet (20 mg total)  by mouth daily.  ? valACYclovir (VALTREX) 500 MG tablet Take 1 tablet (500 mg total) by mouth daily.  ? ?No facility-administered encounter medications on file as of 12/11/2021.  ?  ? ?Lab Results  ?Component Value Date  ? WBC 7.3 11/16/2020  ? HGB 14.9 11/16/2020  ? HCT 43.3 11/16/2020  ? PLT 253.0 11/16/2020  ? GLUCOSE 104 (H) 11/16/2020  ? CHOL 179 11/16/2020  ? TRIG 87.0 11/16/2020  ? HDL 67.30 11/16/2020  ? LDLDIRECT 104.1 07/23/2013  ? Homer 94 11/16/2020  ? ALT 22 11/16/2020  ? AST 17 11/16/2020  ? NA 141 11/16/2020  ? K 4.2 11/16/2020  ? CL 102 11/16/2020  ? CREATININE 0.59 11/16/2020  ? BUN 18 11/16/2020  ? CO2 30 11/16/2020  ? TSH 1.89 11/16/2020  ? INR 0.84 05/03/2016  ? ? ?   ?Assessment & Plan:  ? ?Problem List Items Addressed This Visit   ? ? Abnormal Pap smear of cervix  ?  Previous ASCUS x 2.  Saw gyn.  S/p colposcopy (09/12/21) - negative.  Recommended f/u pap in 6 months.  ? ?  ?  ? Elevated alkaline phosphatase level  ?  Recheck liver panel today. ? ?  ?  ? Health care maintenance  ?  Physical today 12/11/21.  Colposcopy 09/12/21 - negative.  Recommended f/u 6 months.  Colonoscopy 05/2021 - recommended f/u in one year.   ? ?  ?  ? History of colon polyps  ?  Colonoscopy 05/2021 as outlined.  Follow up colonoscopy one year.  ? ?  ?  ? Hypercholesterolemia  ?  The 10-year ASCVD risk score (Arnett DK, et al., 2019) is: 3.7% ?  Values used to calculate the score: ?    Age: 66 years ?    Sex: Female ?    Is Non-Hispanic African American: No ?    Diabetic: No ?    Tobacco smoker: Yes ?    Systolic Blood Pressure: 353 mmHg ?    Is BP treated: No ?    HDL Cholesterol: 67.3 mg/dL ?    Total Cholesterol: 179 mg/dL  ?Low  cholesterol diet and exercise.  Follow lipid panel.  Check lipid panel today.  ? ?  ?  ? Relevant Orders  ? CBC w/Diff  ? Basic Metabolic Panel (BMET)  ? Hepatic function panel  ? Lipid Profile  ? TSH  ? Nasal congestion  ?

## 2021-12-11 NOTE — Telephone Encounter (Signed)
Pt aware Mammo scheduled for 01/14/22 at 3:40pm at [facility] ?

## 2021-12-16 ENCOUNTER — Encounter: Payer: Self-pay | Admitting: Internal Medicine

## 2021-12-16 DIAGNOSIS — R0981 Nasal congestion: Secondary | ICD-10-CM | POA: Insufficient documentation

## 2021-12-16 NOTE — Assessment & Plan Note (Signed)
Have discussed the need to stop smoking.  Follow.  ?

## 2021-12-16 NOTE — Assessment & Plan Note (Signed)
Continue citalopram.  Stable.  Follow.  

## 2021-12-16 NOTE — Assessment & Plan Note (Signed)
Saline nasal spray and steroid nasal spray as outlined.  Follow.   ?

## 2021-12-19 ENCOUNTER — Other Ambulatory Visit: Payer: BC Managed Care – PPO

## 2021-12-30 ENCOUNTER — Ambulatory Visit
Admission: EM | Admit: 2021-12-30 | Discharge: 2021-12-30 | Disposition: A | Discharge status: Home / Self Care | Payer: BC Managed Care – PPO | Attending: Physician Assistant | Admitting: Physician Assistant

## 2021-12-30 ENCOUNTER — Other Ambulatory Visit: Payer: Self-pay

## 2021-12-30 DIAGNOSIS — S0990XA Unspecified injury of head, initial encounter: Secondary | ICD-10-CM | POA: Diagnosis not present

## 2021-12-30 DIAGNOSIS — G44209 Tension-type headache, unspecified, not intractable: Secondary | ICD-10-CM

## 2021-12-30 MED ORDER — TIZANIDINE HCL 4 MG PO TABS: 4.0000 mg | ORAL_TABLET | Freq: Three times a day (TID) | ORAL | 0 refills | Status: AC | PRN

## 2021-12-30 MED ORDER — KETOROLAC TROMETHAMINE 30 MG/ML IJ SOLN
30.0000 mg | Freq: Once | INTRAMUSCULAR | Status: AC
  Administered 2021-12-30: 30 mg via INTRAVENOUS

## 2021-12-30 NOTE — ED Triage Notes (Signed)
Patient C/O of headache in the back of her head for last couple of days. [facility] stated she [facility] back of head on hanging basket, denies LOC, nausea or vomiting. She states has a knot where she hit her head. Also C/O not being able to sleep since hitting her head. Patient is not taking any blood thinners, has only used Aleve. ?

## 2021-12-30 NOTE — Discharge Instructions (Signed)
HEADACHE: You were seen in clinic today for headache.  Symptoms are consistent with a tension type headache.  Injuring your head did not improve your condition likely made things a little worse but I do not think it is CT of your head at this time.  We have given you an injection of ketorolac in the clinic.  Wait at least 8 hours before taking any more anti-inflammatory medicine.  I have sent a muscle laxer for you to try.  This can make you very sleepy.  You can take Tylenol.  Also try muscle rubs, heating pad.  Rest and take meds as directed. ? ?If at any point, the headache becomes very severe, is associated with fever, is associated with neck pain/stiffness, you feel like passing out, the headache is different from any you've have had before, there are vision changes/issues with speech/issues with balance, or numbness/weakness in a part of the body, you should be seen urgently or emergently for more serious causes of headache.  ?

## 2021-12-30 NOTE — ED Provider Notes (Signed)
?[facility] ? ? ? ?CSN: [id number] ?Arrival date & time: 12/30/21  0815 ? ? ?  ? ?History   ?Chief Complaint ?Chief Complaint  ?Patient presents with  ? Head Injury  ? Headache  ? ? ?HPI ?Maria Munoz is a 56 y.o. female presenting for occipital headache and left-sided neck pain for the past 4 days.  Patient reports headache was mild until she excellently hit her head on a hanging basket yesterday.  She denies any loss of consciousness.  Says headache is now moderate at 5-6 out of 10.  Has taken Aleve and Tylenol without any improvement in symptoms.  Denies any associated dizziness, weakness, nausea/vomiting, vision changes, balance difficulty, fatigue or lethargy, numbness/tingling or weakness.  History of migraines when she was younger.  Patient denies any other injuries and says that her head was hurting before she hit it on the hanging basket.  No radiation of pain to extremities.  Increased pain when she flexes her neck forward.  Patient believes she has a knot on the back of her head.  She has no other concerns or complaints today. ? ?HPI ? ?Past Medical History:  ?Diagnosis Date  ? Arthritis   ? Herpes   ? H/O  ? History of abnormal Pap smear   ? Hypercholesterolemia   ? ? ?Patient Active Problem List  ? Diagnosis Date Noted  ? Nasal congestion 12/16/2021  ? Cough 07/08/2021  ? Elevated alkaline phosphatase level 05/30/2019  ? Tobacco abuse 12/19/2018  ? History of chest pain 12/19/2018  ? Pendulous breast 05/31/2018  ? Vaginal bleeding 02/27/2017  ? History of colon polyps 10/22/2016  ? OA (osteoarthritis) of knee 05/13/2016  ? Health care maintenance 01/01/2015  ? Neck pain 12/19/2013  ? CTS (carpal tunnel syndrome) 12/19/2013  ? Herpes 07/15/2013  ? Knee pain 07/15/2013  ? Hypercholesterolemia 07/15/2013  ? Abnormal Pap smear of cervix 07/15/2013  ? Stress 07/15/2013  ? ? ?Past Surgical History:  ?Procedure Laterality Date  ? ANTERIOR CRUCIATE LIGAMENT REPAIR Left 2009  ? CERVICAL BIOPSY  W/  LOOP ELECTRODE EXCISION    ? COLONOSCOPY WITH PROPOFOL N/A 10/18/2016  ? Procedure: COLONOSCOPY WITH PROPOFOL;  Surgeon: Lollie Sails, MD;  Location: [facility] ENDOSCOPY;  Service: Endoscopy;  Laterality: N/A;  ? COLONOSCOPY WITH PROPOFOL N/A 05/31/2021  ? Procedure: COLONOSCOPY WITH PROPOFOL;  Surgeon: Annamaria Helling, DO;  Location: [facility] ENDOSCOPY;  Service: Gastroenterology;  Laterality: N/A;  ? JOINT REPLACEMENT    ? RT Knee  ? NOVASURE ABLATION    ? TOTAL KNEE ARTHROPLASTY Right 05/13/2016  ? Procedure: RIGHT TOTAL KNEE ARTHROPLASTY;  Surgeon: Gaynelle Arabian, MD;  Location: [facility] ORS;  Service: Orthopedics;  Laterality: Right;  ? TYMPANOPLASTY    ? ? ?OB History   ?No obstetric history on file. ?  ? ? ? ?Home Medications   ? ?Prior to Admission medications   ?Medication Sig Start Date End Date Taking? Authorizing Provider  ?citalopram (CELEXA) 20 MG tablet Take 1 tablet (20 mg total) by mouth daily. 07/20/21  Yes Einar Pheasant, MD  ?valACYclovir (VALTREX) 500 MG tablet Take 1 tablet (500 mg total) by mouth daily. 07/20/21  Yes Einar Pheasant, MD  ? ? ?Family History ?Family History  ?Problem Relation Age of Onset  ? Arthritis Mother   ? Hyperlipidemia Mother   ? Hypertension Mother   ? Cancer Father   ?     prostate  ? Hyperlipidemia Father   ? Hypertension Father   ?  Alcohol abuse Maternal Grandfather   ? Breast cancer Neg Hx   ? ? ?Social History ?Social History  ? ?Tobacco Use  ? Smoking status: Every Day  ?  Packs/day: 0.25  ?  Years: 3.00  ?  Pack years: 0.75  ?  Types: Cigarettes  ? Smokeless tobacco: Never  ? Tobacco comments:  ?  2 cigarettes a day  ?Vaping Use  ? Vaping Use: Never used  ?Substance Use Topics  ? Alcohol use: Yes  ?  Alcohol/week: 0.0 standard drinks  ?  Comment: socially  ? Drug use: No  ? ? ? ?Allergies   ?Spinach and Ampicillin ? ? ?Review of Systems ?Review of Systems  ?Constitutional:  Negative for fatigue.  ?Eyes:  Negative for photophobia and visual disturbance.   ?Gastrointestinal:  Negative for nausea and vomiting.  ?Musculoskeletal:  Positive for neck pain. Negative for gait problem and neck stiffness.  ?Skin:  Negative for color change and wound.  ?Neurological:  Positive for headaches. Negative for dizziness, syncope, facial asymmetry, weakness, light-headedness and numbness.  ?Psychiatric/Behavioral:  Negative for confusion.   ? ? ?Physical Exam ?Triage Vital Signs ?ED Triage Vitals [12/30/21 0907]  ?Enc Vitals Group  ?   BP 126/84  ?   Pulse Rate 64  ?   Resp 14  ?   Temp 98.2 ?F (36.8 ?C)  ?   Temp Source Oral  ?   SpO2 99 %  ?   Weight 152 lb (68.9 kg)  ?   Height '5\' 6"'$  (1.676 m)  ?   Head Circumference   ?   Peak Flow   ?   Pain Score   ?   Pain Loc   ?   Pain Edu?   ?   Excl. in [place]?   ? ?No data found. ? ?Updated Vital Signs ?BP 126/84 (BP Location: Left Arm)   Pulse 64   Temp 98.2 ?F (36.8 ?C) (Oral)   Resp 14   Ht '5\' 6"'$  (1.676 m)   Wt 152 lb (68.9 kg)   SpO2 99%   BMI 24.53 kg/m?   ?   ? ?Physical Exam ?Vitals and nursing note reviewed.  ?Constitutional:   ?   General: She is not in acute distress. ?   Appearance: Normal appearance. She is not ill-appearing or toxic-appearing.  ?HENT:  ?   Head: Normocephalic and atraumatic.  ?   Comments: Very slight area of swelling left occipital area.  Tenderness to palpation. ?   Right Ear: Tympanic membrane, ear canal and external ear normal.  ?   Left Ear: Tympanic membrane, ear canal and external ear normal.  ?   Nose: Nose normal.  ?   Mouth/Throat:  ?   Mouth: Mucous membranes are moist.  ?   Pharynx: Oropharynx is clear.  ?Eyes:  ?   General: No scleral icterus.    ?   Right eye: No discharge.     ?   Left eye: No discharge.  ?   Extraocular Movements: Extraocular movements intact.  ?   Conjunctiva/sclera: Conjunctivae normal.  ?   Pupils: Pupils are equal, round, and reactive to light.  ?Cardiovascular:  ?   Rate and Rhythm: Normal rate and regular rhythm.  ?   Heart sounds: Normal heart sounds.  ?Pulmonary:   ?   Effort: Pulmonary effort is normal. No respiratory distress.  ?   Breath sounds: Normal breath sounds.  ?Musculoskeletal:  ?   Cervical back:  Normal range of motion and neck supple. Tenderness (TTP left paracervical muscles) present.  ?Skin: ?   General: Skin is dry.  ?Neurological:  ?   General: No focal deficit present.  ?   Mental Status: She is alert and oriented to person, place, and time. Mental status is at baseline.  ?   Motor: No weakness.  ?   Coordination: Coordination normal.  ?   Gait: Gait normal.  ?Psychiatric:     ?   Mood and Affect: Mood normal.     ?   Behavior: Behavior normal.     ?   Thought Content: Thought content normal.  ? ? ? ?UC Treatments / Results  ?Labs ?(all labs ordered are listed, but only abnormal results are displayed) ?Labs Reviewed - No data to display ? ?EKG ? ? ?Radiology ?No results found. ? ?Procedures ?Procedures (including critical care time) ? ?Medications Ordered in UC ?Medications  ?ketorolac (TORADOL) 30 MG/ML injection 30 mg (30 mg Intravenous Given 12/30/21 0956)  ? ? ?Initial Impression / Assessment and Plan / UC Course  ?I have reviewed the triage vital signs and the nursing notes. ? ?Pertinent labs & imaging results that were available during my care of the patient were reviewed by me and considered in my medical decision making (see chart for details). ? ?56 year old female presenting for headache x4 days.  Minor head injury yesterday when [facility]  Denies loss of consciousness and no red flag signs or symptoms.  Has tried Aleve and Tylenol without improvement in symptoms.  Headache is not the worst headache she has ever had.  Vitals normal and stable.  Patient overall well-appearing.  Normal neurological exam.  Normal cranial nerves.  Normal nose to finger testing, 5 out of 5 strength bilateral upper and lower extremities.  Normal rapid supination pronation of hands.  She does have tenderness palpation of the left  side of her neck and increased pain when she flexes forward but full range of motion of neck without any stiffness.  Suspect patient likely has tension type headache and the minor head injury exacerbated the situation.  Patient gi

## 2022-01-06 ENCOUNTER — Encounter: Payer: Self-pay | Admitting: Internal Medicine

## 2022-01-06 DIAGNOSIS — R238 Other skin changes: Secondary | ICD-10-CM

## 2022-01-08 NOTE — Telephone Encounter (Signed)
Order placed for dermatology referral.  

## 2022-01-10 ENCOUNTER — Other Ambulatory Visit (INDEPENDENT_AMBULATORY_CARE_PROVIDER_SITE_OTHER): Payer: BC Managed Care – PPO

## 2022-01-10 DIAGNOSIS — E7800 Pure hypercholesterolemia, unspecified: Secondary | ICD-10-CM

## 2022-01-10 DIAGNOSIS — Z1159 Encounter for screening for other viral diseases: Secondary | ICD-10-CM

## 2022-01-10 DIAGNOSIS — Z114 Encounter for screening for human immunodeficiency virus [HIV]: Secondary | ICD-10-CM

## 2022-01-10 LAB — HEPATIC FUNCTION PANEL
ALT: 16 U/L (ref 0–35)
AST: 16 U/L (ref 0–37)
Albumin: 4.2 g/dL (ref 3.5–5.2)
Alkaline Phosphatase: 59 U/L (ref 39–117)
Bilirubin, Direct: 0.1 mg/dL (ref 0.0–0.3)
Total Bilirubin: 0.6 mg/dL (ref 0.2–1.2)
Total Protein: 6.7 g/dL (ref 6.0–8.3)

## 2022-01-10 LAB — LIPID PANEL
Cholesterol: 195 mg/dL (ref 0–200)
HDL: 82.1 mg/dL (ref >39.00)
LDL Cholesterol: 101 mg/dL — ABNORMAL HIGH (ref 0–99)
NonHDL: 112.6
Total CHOL/HDL Ratio: 2
Triglycerides: 59 mg/dL (ref 0.0–149.0)
VLDL: 11.8 mg/dL (ref 0.0–40.0)

## 2022-01-10 LAB — CBC WITH DIFFERENTIAL/PLATELET
Basophils Absolute: 0.1 10*3/uL (ref 0.0–0.1)
Basophils Relative: 1.1 % (ref 0.0–3.0)
Eosinophils Absolute: 0.3 10*3/uL (ref 0.0–0.7)
Eosinophils Relative: 5 % (ref 0.0–5.0)
HCT: 39.8 % (ref 36.0–46.0)
Hemoglobin: 13.5 g/dL (ref 12.0–15.0)
Lymphocytes Relative: 43.4 % (ref 12.0–46.0)
Lymphs Abs: 2.6 10*3/uL (ref 0.7–4.0)
MCHC: 33.9 g/dL (ref 30.0–36.0)
MCV: 97.7 fl (ref 78.0–100.0)
Monocytes Absolute: 0.3 10*3/uL (ref 0.1–1.0)
Monocytes Relative: 5.1 % (ref 3.0–12.0)
Neutro Abs: 2.7 10*3/uL (ref 1.4–7.7)
Neutrophils Relative %: 45.4 % (ref 43.0–77.0)
Platelets: 245 10*3/uL (ref 150.0–400.0)
RBC: 4.07 Mil/uL (ref 3.87–5.11)
RDW: 13.8 % (ref 11.5–15.5)
WBC: 6 10*3/uL (ref 4.0–10.5)

## 2022-01-10 LAB — BASIC METABOLIC PANEL
BUN: 16 mg/dL (ref 6–23)
CO2: 29 mEq/L (ref 19–32)
Calcium: 9.2 mg/dL (ref 8.4–10.5)
Chloride: 106 mEq/L (ref 96–112)
Creatinine, Ser: 0.55 mg/dL (ref 0.40–1.20)
GFR: 101.93 mL/min (ref >60.00)
Glucose, Bld: 95 mg/dL (ref 70–99)
Potassium: 4.1 mEq/L (ref 3.5–5.1)
Sodium: 142 mEq/L (ref 135–145)

## 2022-01-10 LAB — TSH: TSH: 1.2 u[IU]/mL (ref 0.35–5.50)

## 2022-01-11 LAB — HEPATITIS C ANTIBODY
Hepatitis C Ab: NONREACTIVE
SIGNAL TO CUT-OFF: 0.09 (ref <1.00)

## 2022-01-11 LAB — HIV ANTIBODY (ROUTINE TESTING W REFLEX): HIV 1&2 Ab, 4th Generation: NONREACTIVE

## 2022-01-14 ENCOUNTER — Ambulatory Visit
Admission: RE | Admit: 2022-01-14 | Discharge: 2022-01-14 | Disposition: A | Discharge status: Home / Self Care | Payer: BC Managed Care – PPO | Source: Ambulatory Visit | Attending: Internal Medicine | Admitting: Internal Medicine

## 2022-01-14 DIAGNOSIS — Z1231 Encounter for screening mammogram for malignant neoplasm of breast: Secondary | ICD-10-CM | POA: Insufficient documentation

## 2022-02-03 ENCOUNTER — Other Ambulatory Visit: Payer: Self-pay | Admitting: Internal Medicine

## 2022-02-05 ENCOUNTER — Other Ambulatory Visit: Payer: Self-pay | Admitting: Internal Medicine

## 2022-02-20 ENCOUNTER — Encounter: Payer: Self-pay | Admitting: Internal Medicine

## 2022-02-21 ENCOUNTER — Encounter: Payer: Self-pay | Admitting: Internal Medicine

## 2022-02-28 ENCOUNTER — Encounter: Payer: Self-pay | Admitting: Internal Medicine

## 2022-03-01 NOTE — Telephone Encounter (Signed)
Will hold until hear back from [place]

## 2022-04-23 ENCOUNTER — Encounter: Payer: Self-pay | Admitting: Internal Medicine

## 2022-04-24 ENCOUNTER — Ambulatory Visit: Payer: BC Managed Care – PPO | Admitting: Internal Medicine

## 2022-05-08 ENCOUNTER — Telehealth: Payer: BC Managed Care – PPO | Admitting: Physician Assistant

## 2022-05-08 DIAGNOSIS — R3989 Other symptoms and signs involving the genitourinary system: Secondary | ICD-10-CM

## 2022-05-08 MED ORDER — SULFAMETHOXAZOLE-TRIMETHOPRIM 800-160 MG PO TABS: 1.0000 | ORAL_TABLET | Freq: Two times a day (BID) | ORAL | 0 refills | Status: DC

## 2022-05-08 NOTE — Progress Notes (Signed)

## 2022-05-25 ENCOUNTER — Emergency Department
Admission: EM | Admit: 2022-05-25 | Discharge: 2022-05-25 | Disposition: A | Discharge status: Home / Self Care | Payer: BC Managed Care – PPO | Attending: Emergency Medicine | Admitting: Emergency Medicine

## 2022-05-25 ENCOUNTER — Other Ambulatory Visit: Payer: Self-pay

## 2022-05-25 ENCOUNTER — Emergency Department: Payer: BC Managed Care – PPO

## 2022-05-25 ENCOUNTER — Encounter: Payer: Self-pay | Admitting: Emergency Medicine

## 2022-05-25 DIAGNOSIS — W01198A Fall on same level from slipping, tripping and stumbling with subsequent striking against other object, initial encounter: Secondary | ICD-10-CM | POA: Insufficient documentation

## 2022-05-25 DIAGNOSIS — W19XXXA Unspecified fall, initial encounter: Secondary | ICD-10-CM

## 2022-05-25 DIAGNOSIS — S0990XA Unspecified injury of head, initial encounter: Secondary | ICD-10-CM | POA: Diagnosis present

## 2022-05-25 DIAGNOSIS — S0003XA Contusion of scalp, initial encounter: Secondary | ICD-10-CM | POA: Insufficient documentation

## 2022-05-25 LAB — CBC
HCT: 41.3 % (ref 36.0–46.0)
Hemoglobin: 13.9 g/dL (ref 12.0–15.0)
MCH: 32.4 pg (ref 26.0–34.0)
MCHC: 33.7 g/dL (ref 30.0–36.0)
MCV: 96.3 fL (ref 80.0–100.0)
Platelets: 247 10*3/uL (ref 150–400)
RBC: 4.29 MIL/uL (ref 3.87–5.11)
RDW: 12.8 % (ref 11.5–15.5)
WBC: 9.7 10*3/uL (ref 4.0–10.5)
nRBC: 0 % (ref 0.0–0.2)

## 2022-05-25 LAB — BASIC METABOLIC PANEL
Anion gap: 13 (ref 5–15)
BUN: 15 mg/dL (ref 6–20)
CO2: 23 mmol/L (ref 22–32)
Calcium: 9 mg/dL (ref 8.9–10.3)
Chloride: 97 mmol/L — ABNORMAL LOW (ref 98–111)
Creatinine, Ser: 0.47 mg/dL (ref 0.44–1.00)
GFR, Estimated: >60 mL/min (ref >60)
Glucose, Bld: 112 mg/dL — ABNORMAL HIGH (ref 70–99)
Potassium: 3.6 mmol/L (ref 3.5–5.1)
Sodium: 133 mmol/L — ABNORMAL LOW (ref 135–145)

## 2022-05-25 NOTE — ED Notes (Signed)
Pt taken to CT.

## 2022-05-25 NOTE — ED Notes (Signed)
NAD noted at time of D/C. Pt denies questions or concerns. Pt ambulatory to the lobby at this time, pt refused wheelchair to the lobby.

## 2022-05-25 NOTE — ED Provider Notes (Signed)
[facility] Provider Note    Event Date/Time   First MD Initiated Contact with Patient 05/25/22 2250     (approximate)   History   Fall   HPI  Maria Munoz is a 56 y.o. female who presents for evaluation after a fall resulting in a head injury.  She and her husband were playing pool with some other people and somehow she tripped over her own feet and fell against the pool table, striking the right upper part of her forehead.  She and her husband both report that she fully lost consciousness and had a little bit of shaking or seizure-like activity immediately after the fall, but the loss of consciousness and the return to a normal state occurred very quickly.  She reports some soreness in the side of her right forehead, but other than that she feels fine.  She has had no persistent nausea, no episodes of vomiting, no visual changes, no changes in her hearing, no neck pain.  She has had no chest pain or shortness of breath, no abdominal pain.  She does not take anticoagulation.  Given the suddenness of the fall and how hard she hit her head, as well as the loss of consciousness, they just want to make sure everything was okay.  She has not had any perseveration, confusion, and remembers everything other than the actual impact itself.     Physical Exam   Triage Vital Signs: ED Triage Vitals [05/25/22 2033]  Enc Vitals Group     BP (!) 148/84     Pulse Rate 82     Resp 16     Temp 97.6 F (36.4 C)     Temp Source Oral     SpO2 95 %     Weight 72.6 kg (160 lb)     Height 1.702 m ('5\' 7"'$ )     Head Circumference      Peak Flow      Pain Score 0     Pain Loc      Pain Edu?      Excl. in [place]?     Most recent vital signs: Vitals:   05/25/22 2033 05/25/22 2230  BP: (!) 148/84 118/77  Pulse: 82 72  Resp: 16 17  Temp: 97.6 F (36.4 C)   SpO2: 95% 97%     General: Awake, no distress.  HEENT: Patient has hematoma and ecchymosis to the right forehead just  anterior to the temporal region.  No laceration.  No battle sign or raccoon eyes.  No other trauma is evident.  Pupils are equal and reactive.  Extraocular movement is intact. CV:  Good peripheral perfusion.  Resp:  Normal effort.  Abd:  No distention.  Other:  No focal neurological deficits.  Mood and affect are normal and appropriate.  Alert and oriented x3.   ED Results / Procedures / Treatments   Labs (all labs ordered are listed, but only abnormal results are displayed) Labs Reviewed  BASIC METABOLIC PANEL - Abnormal; Notable for the following components:      Result Value   Sodium 133 (*)    Chloride 97 (*)    Glucose, Bld 112 (*)    All other components within normal limits  CBC     EKG  ED ECG REPORT I, Hinda Kehr, the attending physician, personally viewed and interpreted this ECG.  Date: 05/25/2022 EKG Time: 20: 40 Rate: 72 Rhythm: normal sinus rhythm QRS Axis: normal Intervals: normal  ST/T Wave abnormalities: normal Narrative Interpretation: no evidence of acute ischemia    RADIOLOGY I viewed and interpreted the patient's head CT and cervical spine CT.  I see some chronic degenerative changes to multiple levels of the C-spine but no evidence of acute fracture.  There is no intracranial bleeding.  The radiology report agrees with these assessments.    PROCEDURES:  Critical Care performed: No  Procedures   MEDICATIONS ORDERED IN ED: Medications - No data to display   IMPRESSION / MDM / [place] / ED COURSE  I reviewed the triage vital signs and the nursing notes.                              Differential diagnosis includes, but is not limited to, contusion, hematoma, skull fracture, intracranial bleed, seizure, cardiogenic syncope.  Patient's presentation is most consistent with acute presentation with potential threat to life or bodily function.  Labs/studies ordered: Head CT, cervical spine CT, EKG, basic metabolic panel,  CBC.  Fortunately patient's mental status/neurological exam are intact.  Vital signs are stable and within normal limits.  Mood and affect are normal and appropriate.  No clinical signs of concussion.  EKG shows no evidence of ischemia nor arrhythmia.  BMP and CBC are essentially normal other than very slight hyponatremia.  As documented above, head CT and cervical spine CT showed no evidence of acute changes or fractures or intracranial bleeding.  Patient is comfortable and does not need or want analgesia.  I went over the results with her and her husband and they are both comfortable with the plan for discharge and outpatient follow-up.  I gave my usual and customary post head injury recommendations and return precautions.      FINAL CLINICAL IMPRESSION(S) / ED DIAGNOSES   Final diagnoses:  Fall, initial encounter  Minor head injury, initial encounter  Scalp hematoma, initial encounter     Rx / DC Orders   ED Discharge Orders     None        Note:  This document was prepared using Dragon voice recognition software and may include unintentional dictation errors.   Hinda Kehr, MD 05/25/22 [phone]

## 2022-05-25 NOTE — Discharge Instructions (Addendum)

## 2022-05-25 NOTE — ED Triage Notes (Signed)
Pt presents to ER with complaints of fall. Pt reports she tripped and fell hitting the the right side of her head against a pool table. Per pt significant other pt passed out. Denies taking any blood thinners. Pt talks in complete sentences no distress noted

## 2022-05-27 ENCOUNTER — Encounter: Payer: Self-pay | Admitting: Internal Medicine

## 2022-05-27 NOTE — Telephone Encounter (Signed)
Please confirm she is doing ok.  Please schedule her for an appt 06/12/22 - 11:00.  Will see if have cancellation for earlier appt.  Any problems, she is to let us know.

## 2022-05-28 NOTE — Telephone Encounter (Signed)
Message sent to scheduler for appt.

## 2022-05-31 ENCOUNTER — Ambulatory Visit: Payer: BC Managed Care – PPO | Admitting: Internal Medicine

## 2022-05-31 ENCOUNTER — Encounter: Payer: Self-pay | Admitting: Internal Medicine

## 2022-05-31 DIAGNOSIS — E7800 Pure hypercholesterolemia, unspecified: Secondary | ICD-10-CM | POA: Diagnosis not present

## 2022-05-31 DIAGNOSIS — E871 Hypo-osmolality and hyponatremia: Secondary | ICD-10-CM | POA: Diagnosis not present

## 2022-05-31 DIAGNOSIS — R55 Syncope and collapse: Secondary | ICD-10-CM | POA: Diagnosis not present

## 2022-05-31 LAB — BASIC METABOLIC PANEL
BUN: 24 mg/dL — ABNORMAL HIGH (ref 6–23)
CO2: 31 mEq/L (ref 19–32)
Calcium: 9.8 mg/dL (ref 8.4–10.5)
Chloride: 102 mEq/L (ref 96–112)
Creatinine, Ser: 0.57 mg/dL (ref 0.40–1.20)
GFR: 100.78 mL/min (ref >60.00)
Glucose, Bld: 126 mg/dL — ABNORMAL HIGH (ref 70–99)
Potassium: 4.1 mEq/L (ref 3.5–5.1)
Sodium: 140 mEq/L (ref 135–145)

## 2022-05-31 NOTE — Progress Notes (Addendum)
Patient ID: Maria Munoz, female   DOB: 1966-03-07, 56 y.o.   MRN: [id number]   Subjective:    Patient ID: Maria Munoz, female    DOB: 04/25/1966, 56 y.o.   MRN: [id number]   Patient here for work in appt .   HPI Work in - ER follow up.  She is accompanied by Ysidro Evert.  History obtained from both of them.  ER evaluation 05/25/22 - fall - hit head.  Was playing pool.  Ysidro Evert witnessed the episode.  She was standing.  Per his report, she went to turn and went to the ground.  She hit the right side of her head going down and injured her right elbow.  States she was shaking for about 10-15 seconds. Episode may have lasted 30-45 seconds.  She does not remember the "fall".  She remembers standing and then [place] over her - calling her name.  Was not confused.  Had eaten.  Denies any nausea or vomiting.  No chest pain or sob.  No dizziness or light headedness - preceding the episode. Went to ER.  In ER  - CT head - no acute intracranial abnormality. Did have right frontal soft tissue swelling - no fracture.  C spine CT -  No acute fracture or traumatic subluxation. 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 Multilevel neural foraminal stenosis.  She is feeling better now.  Energy not fully back yet.  No other episodes.  Eating and drinking ok.  No residual headache, dizziness or light headedness.     Past Medical History:  Diagnosis Date   Arthritis    Herpes    H/O   History of abnormal Pap smear    Hypercholesterolemia    Past Surgical History:  Procedure Laterality Date   ANTERIOR CRUCIATE LIGAMENT REPAIR Left 2009   CERVICAL BIOPSY  W/ LOOP ELECTRODE EXCISION     COLONOSCOPY WITH PROPOFOL N/A 10/18/2016   Procedure: COLONOSCOPY WITH PROPOFOL;  Surgeon: Lollie Sails, MD;  Location: [facility] ENDOSCOPY;  Service: Endoscopy;  Laterality: N/A;   COLONOSCOPY WITH PROPOFOL N/A 05/31/2021   Procedure:  COLONOSCOPY WITH PROPOFOL;  Surgeon: Annamaria Helling, DO;  Location: [facility] ENDOSCOPY;  Service: Gastroenterology;  Laterality: N/A;   JOINT REPLACEMENT     RT Knee   NOVASURE ABLATION     TOTAL KNEE ARTHROPLASTY Right 05/13/2016   Procedure: RIGHT TOTAL KNEE ARTHROPLASTY;  Surgeon: Gaynelle Arabian, MD;  Location: [facility] ORS;  Service: Orthopedics;  Laterality: Right;   TYMPANOPLASTY     Family History  Problem Relation Age of Onset   Arthritis Mother    Hyperlipidemia Mother    Hypertension Mother    Cancer Father        prostate   Hyperlipidemia Father    Hypertension Father    Alcohol abuse Maternal Grandfather    Breast cancer Neg Hx    Social History   Socioeconomic History   Marital status: Divorced    Spouse name: Not on file   Number of children: Not on file   Years of education: Not on file   Highest education level: Not on file  Occupational History   Not on file  Tobacco Use   Smoking status: Every Day    Packs/day: 0.25    Years: 3.00    Total pack years: 0.75    Types: Cigarettes   Smokeless tobacco: Never   Tobacco comments:  2 cigarettes a day  Vaping Use   Vaping Use: Never used  Substance and Sexual Activity   Alcohol use: Yes    Alcohol/week: 0.0 standard drinks of alcohol    Comment: socially   Drug use: No   Sexual activity: Not on file  Other Topics Concern   Not on file  Social History Narrative   Not on file   Social Determinants of Health   Financial Resource Strain: Not on file  Food Insecurity: Not on file  Transportation Needs: Not on file  Physical Activity: Not on file  Stress: Not on file  Social Connections: Not on file     Review of Systems  Constitutional:  Negative for appetite change and unexpected weight change.  HENT:  Negative for congestion and sinus pressure.   Respiratory:  Negative for cough, chest tightness and shortness of breath.   Cardiovascular:  Negative for chest pain, palpitations and leg swelling.   Gastrointestinal:  Negative for abdominal pain, diarrhea, nausea and vomiting.  Genitourinary:  Negative for difficulty urinating and dysuria.  Musculoskeletal:  Negative for joint swelling and myalgias.  Skin:  Negative for color change and rash.  Neurological:  Negative for dizziness, light-headedness and headaches.  Psychiatric/Behavioral:  Negative for agitation and dysphoric mood.        Objective:     BP 122/70 (BP Location: Left Arm, Patient Position: Sitting, Cuff Size: Normal)   Pulse 78   Temp (!) 97.5 F (36.4 C) (Oral)   Ht '5\' 7"'$  (1.702 m)   Wt 166 lb (75.3 kg)   SpO2 96%   BMI 26.00 kg/m  Wt Readings from Last 3 Encounters:  05/31/22 166 lb (75.3 kg)  05/25/22 160 lb (72.6 kg)  12/30/21 152 lb (68.9 kg)    Physical Exam Vitals reviewed.  Constitutional:      General: She is not in acute distress.    Appearance: Normal appearance.  HENT:     Head: Normocephalic and atraumatic.     Right Ear: External ear normal.     Left Ear: External ear normal.  Eyes:     General: No scleral icterus.       Right eye: No discharge.        Left eye: No discharge.     Conjunctiva/sclera: Conjunctivae normal.  Neck:     Thyroid: No thyromegaly.  Cardiovascular:     Rate and Rhythm: Normal rate and regular rhythm.  Pulmonary:     Effort: No respiratory distress.     Breath sounds: Normal breath sounds. No wheezing.  Abdominal:     General: Bowel sounds are normal.     Palpations: Abdomen is soft.     Tenderness: There is no abdominal tenderness.  Musculoskeletal:        General: No swelling or tenderness.     Cervical back: Neck supple. No tenderness.     Comments: Good rom - elbow.    Lymphadenopathy:     Cervical: No cervical adenopathy.  Skin:    Findings: No erythema or rash.  Neurological:     Mental Status: She is alert.  Psychiatric:        Mood and Affect: Mood normal.        Behavior: Behavior normal.      Outpatient Encounter Medications as of  05/31/2022  Medication Sig   citalopram (CELEXA) 20 MG tablet TAKE 1 TABLET(20 MG) BY MOUTH DAILY   sulfamethoxazole-trimethoprim (BACTRIM DS) 800-160 MG tablet Take 1 tablet  by mouth 2 (two) times daily.   valACYclovir (VALTREX) 500 MG tablet TAKE 1 TABLET(500 MG) BY MOUTH DAILY   No facility-administered encounter medications on file as of 05/31/2022.     Lab Results  Component Value Date   WBC 9.7 05/25/2022   HGB 13.9 05/25/2022   HCT 41.3 05/25/2022   PLT 247 05/25/2022   GLUCOSE 126 (H) 05/31/2022   CHOL 195 01/10/2022   TRIG 59.0 01/10/2022   HDL 82.10 01/10/2022   LDLDIRECT 104.1 07/23/2013   LDLCALC 101 (H) 01/10/2022   ALT 16 01/10/2022   AST 16 01/10/2022   NA 140 05/31/2022   K 4.1 05/31/2022   CL 102 05/31/2022   CREATININE 0.57 05/31/2022   BUN 24 (H) 05/31/2022   CO2 31 05/31/2022   TSH 1.20 01/10/2022   INR 0.84 05/03/2016    CT Head Wo Contrast  Result Date: 05/25/2022 CLINICAL DATA:  Head and neck trauma. Patient fell and hit her head against a pool table. Passed out. EXAM: CT HEAD WITHOUT CONTRAST CT CERVICAL SPINE WITHOUT CONTRAST TECHNIQUE: Multidetector CT imaging of the head and cervical spine was performed following the standard protocol without intravenous contrast. Multiplanar CT image reconstructions of the cervical spine were also generated. RADIATION DOSE REDUCTION: This exam was performed according to the departmental dose-optimization program which includes automated exposure control, adjustment of the mA and/or kV according to patient size and/or use of iterative reconstruction technique. COMPARISON:  None Available. FINDINGS: CT HEAD FINDINGS Brain: No evidence of acute infarction, hemorrhage, hydrocephalus, extra-axial collection or mass lesion/mass effect. Vascular: No hyperdense vessel or unexpected calcification. Skull: Right forehead soft tissue swelling without evidence of calvarial fracture. Sinuses/Orbits: No acute finding. Other: None. CT  CERVICAL SPINE FINDINGS Alignment: Straightening of the cervical spine. Stepwise anterolisthesis at C3, C4. Mild retrolisthesis at C5. Skull base and vertebrae: No acute fracture. No primary bone lesion or focal pathologic process. Soft tissues and spinal canal: No prevertebral fluid or swelling. No visible canal hematoma. Disc levels: C2-C3: Mild right and moderate left facet joint arthropathy. No significant spinal canal or neural foraminal stenosis. C3-C4: Moderate bilateral facet joint arthropathy without significant spinal canal or neural foraminal stenosis. C4-C5: Mild right and moderate left facet joint arthropathy. No significant neural foraminal stenosis. C5-C6: Disc height loss and uncovertebral joint arthropathy with mild left and moderate right neural foraminal stenosis. C6-C7: Disc height loss and uncovertebral joint arthropathy with mild bilateral neural foraminal stenosis, right worse than the left. C7-T1: Mild right facet joint arthropathy. No significant neural foraminal stenosis. Upper chest: Mild emphysematous changes of the lung apices. Other: None IMPRESSION: CT head: 1. No acute intracranial abnormality. 2. Right frontal soft tissue swelling without evidence of calvarial fracture. CT cervical spine: 1.  No acute fracture or traumatic subluxation. 2. 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 Multilevel neural foraminal stenosis as detailed above. 3.  No acute paraspinal soft tissue abnormality. Electronically Signed   By: Keane Police D.O.   On: 05/25/2022 23:02   CT Cervical Spine Wo Contrast  Result Date: 05/25/2022 CLINICAL DATA:  Head and neck trauma. Patient fell and hit her head against a pool table. Passed out. EXAM: CT HEAD WITHOUT CONTRAST CT CERVICAL SPINE WITHOUT CONTRAST TECHNIQUE: Multidetector CT imaging of the head and cervical spine was performed following the standard  protocol without intravenous contrast. Multiplanar CT image reconstructions of the cervical spine were also generated. RADIATION DOSE  REDUCTION: This exam was performed according to the departmental dose-optimization program which includes automated exposure control, adjustment of the mA and/or kV according to patient size and/or use of iterative reconstruction technique. COMPARISON:  None Available. FINDINGS: CT HEAD FINDINGS Brain: No evidence of acute infarction, hemorrhage, hydrocephalus, extra-axial collection or mass lesion/mass effect. Vascular: No hyperdense vessel or unexpected calcification. Skull: Right forehead soft tissue swelling without evidence of calvarial fracture. Sinuses/Orbits: No acute finding. Other: None. CT CERVICAL SPINE FINDINGS Alignment: Straightening of the cervical spine. Stepwise anterolisthesis at C3, C4. Mild retrolisthesis at C5. Skull base and vertebrae: No acute fracture. No primary bone lesion or focal pathologic process. Soft tissues and spinal canal: No prevertebral fluid or swelling. No visible canal hematoma. Disc levels: C2-C3: Mild right and moderate left facet joint arthropathy. No significant spinal canal or neural foraminal stenosis. C3-C4: Moderate bilateral facet joint arthropathy without significant spinal canal or neural foraminal stenosis. C4-C5: Mild right and moderate left facet joint arthropathy. No significant neural foraminal stenosis. C5-C6: Disc height loss and uncovertebral joint arthropathy with mild left and moderate right neural foraminal stenosis. C6-C7: Disc height loss and uncovertebral joint arthropathy with mild bilateral neural foraminal stenosis, right worse than the left. C7-T1: Mild right facet joint arthropathy. No significant neural foraminal stenosis. Upper chest: Mild emphysematous changes of the lung apices. Other: None IMPRESSION: CT head: 1. No acute intracranial abnormality. 2. Right frontal soft tissue swelling without evidence of  calvarial fracture. CT cervical spine: 1.  No acute fracture or traumatic subluxation. 2. 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 Multilevel neural foraminal stenosis as detailed above. 3.  No acute paraspinal soft tissue abnormality. Electronically Signed   By: Keane Police D.O.   On: 05/25/2022 23:02       Assessment & Plan:   Problem List Items Addressed This Visit     Hypercholesterolemia    The 10-year ASCVD risk score (Arnett DK, et al., 2019) is: 3.5%   Values used to calculate the score:     Age: 51 years     Sex: Female     Is Non-Hispanic African American: No     Diabetic: No     Tobacco smoker: Yes     Systolic Blood Pressure: 347 mmHg     Is BP treated: No     HDL Cholesterol: 82.1 mg/dL     Total Cholesterol: 195 mg/dL  Low cholesterol diet and exercise.  Follow lipid panel.         Hyponatremia    Sodium slightly decreased - labs in ER.  Recheck sodium today to confirm wnl.       Relevant Orders   Basic metabolic panel (Completed)   Syncope    Episode as outlined.  Did lose consciousness and then went down.  Does not remember going to the ground.  No symptoms prior.  Specifically denies chest pain, dizziness or light headedness.  Had eaten.  Was drinking, but no increased alcohol.  W/up in ER as outlined (EKG, labs, scan) and unrevealing.  Discussed further w/up.  No further episodes.  Discussed further cardiac evaluation.  Will refer to cardiology for further evaluation with question of need for cardiac monitor or echo to further evaluate for any structural abnormality.        Relevant Orders   Ambulatory referral to Cardiology     Einar Pheasant, MD

## 2022-06-08 ENCOUNTER — Encounter: Payer: Self-pay | Admitting: Internal Medicine

## 2022-06-08 DIAGNOSIS — R55 Syncope and collapse: Secondary | ICD-10-CM | POA: Insufficient documentation

## 2022-06-08 NOTE — Assessment & Plan Note (Signed)
The 10-year ASCVD risk score (Arnett DK, et al., 2019) is: 3.5%   Values used to calculate the score:     Age: 56 years     Sex: Female     Is Non-Hispanic African American: No     Diabetic: No     Tobacco smoker: Yes     Systolic Blood Pressure: 202 mmHg     Is BP treated: No     HDL Cholesterol: 82.1 mg/dL     Total Cholesterol: 195 mg/dL  Low cholesterol diet and exercise.  Follow lipid panel.

## 2022-06-08 NOTE — Assessment & Plan Note (Signed)
Episode as outlined.  Did lose consciousness and then went down.  Does not remember going to the ground.  No symptoms prior.  Specifically denies chest pain, dizziness or light headedness.  Had eaten.  Was drinking, but no increased alcohol.  W/up in ER as outlined (EKG, labs, scan) and unrevealing.  Discussed further w/up.  No further episodes.  Discussed further cardiac evaluation.  Will refer to cardiology for further evaluation with question of need for cardiac monitor or echo to further evaluate for any structural abnormality.

## 2022-06-08 NOTE — Addendum Note (Signed)
Addended by: Alisa Graff on: 06/08/2022 09:16 PM   Modules accepted: Orders

## 2022-06-08 NOTE — Assessment & Plan Note (Signed)
Sodium slightly decreased - labs in ER.  Recheck sodium today to confirm wnl.

## 2022-06-12 ENCOUNTER — Inpatient Hospital Stay: Payer: BC Managed Care – PPO | Admitting: Internal Medicine

## 2022-07-31 ENCOUNTER — Encounter: Payer: Self-pay | Admitting: Internal Medicine

## 2022-07-31 ENCOUNTER — Ambulatory Visit: Payer: BC Managed Care – PPO | Admitting: Internal Medicine

## 2022-07-31 NOTE — Progress Notes (Deleted)
Patient ID: Maria Munoz, female   DOB: 12/01/1965, 56 y.o.   MRN: [id number]   Subjective:    Patient ID: Maria Munoz, female    DOB: 04-Jun-1966, 56 y.o.   MRN: [id number]   Patient here for No chief complaint on file.  Marland Kitchen   HPI Follow up regarding hypercholesterolemia and increased stress.  Recently evaluated for syncopal episode.  W/up as outlined - previous note.  Was referred to cardiology.    Past Medical History:  Diagnosis Date   Arthritis    Herpes    H/O   History of abnormal Pap smear    Hypercholesterolemia    Past Surgical History:  Procedure Laterality Date   ANTERIOR CRUCIATE LIGAMENT REPAIR Left 2009   CERVICAL BIOPSY  W/ LOOP ELECTRODE EXCISION     COLONOSCOPY WITH PROPOFOL N/A 10/18/2016   Procedure: COLONOSCOPY WITH PROPOFOL;  Surgeon: Lollie Sails, MD;  Location: [facility] ENDOSCOPY;  Service: Endoscopy;  Laterality: N/A;   COLONOSCOPY WITH PROPOFOL N/A 05/31/2021   Procedure: COLONOSCOPY WITH PROPOFOL;  Surgeon: Annamaria Helling, DO;  Location[facility] ENDOSCOPY;  Service: Gastroenterology;  Laterality: N/A;   JOINT REPLACEMENT     RT Knee   NOVASURE ABLATION     TOTAL KNEE ARTHROPLASTY Right 05/13/2016   Procedure: RIGHT TOTAL KNEE ARTHROPLASTY;  Surgeon: Gaynelle Arabian, MD;  Location: [facility] ORS;  Service: Orthopedics;  Laterality: Right;   TYMPANOPLASTY     Family History  Problem Relation Age of Onset   Arthritis Mother    Hyperlipidemia Mother    Hypertension Mother    Cancer Father        prostate   Hyperlipidemia Father    Hypertension Father    Alcohol abuse Maternal Grandfather    Breast cancer Neg Hx    Social History   Socioeconomic History   Marital status: Divorced    Spouse name: Not on file   Number of children: Not on file   Years of education: Not on file   Highest education level: Not on file  Occupational History   Not on file  Tobacco Use   Smoking status: Every Day    Packs/day: 0.25    Years: 3.00    Total pack years:  0.75    Types: Cigarettes   Smokeless tobacco: Never   Tobacco comments:    2 cigarettes a day  Vaping Use   Vaping Use: Never used  Substance and Sexual Activity   Alcohol use: Yes    Alcohol/week: 0.0 standard drinks of alcohol    Comment: socially   Drug use: No   Sexual activity: Not on file  Other Topics Concern   Not on file  Social History Narrative   Not on file   Social Determinants of Health   Financial Resource Strain: Not on file  Food Insecurity: Not on file  Transportation Needs: Not on file  Physical Activity: Not on file  Stress: Not on file  Social Connections: Not on file     Review of Systems     Objective:     There were no vitals taken for this visit. Wt Readings from Last 3 Encounters:  05/31/22 166 lb (75.3 kg)  05/25/22 160 lb (72.6 kg)  12/30/21 152 lb (68.9 kg)    Physical Exam   Outpatient Encounter Medications as of 07/31/2022  Medication Sig   citalopram (CELEXA) 20 MG tablet TAKE 1 TABLET(20 MG) BY MOUTH DAILY   sulfamethoxazole-trimethoprim (BACTRIM DS) 800-160 MG  tablet Take 1 tablet by mouth 2 (two) times daily.   valACYclovir (VALTREX) 500 MG tablet TAKE 1 TABLET(500 MG) BY MOUTH DAILY   No facility-administered encounter medications on file as of 07/31/2022.     Lab Results  Component Value Date   WBC 9.7 05/25/2022   HGB 13.9 05/25/2022   HCT 41.3 05/25/2022   PLT 247 05/25/2022   GLUCOSE 126 (H) 05/31/2022   CHOL 195 01/10/2022   TRIG 59.0 01/10/2022   HDL 82.10 01/10/2022   LDLDIRECT 104.1 07/23/2013   LDLCALC 101 (H) 01/10/2022   ALT 16 01/10/2022   AST 16 01/10/2022   NA 140 05/31/2022   K 4.1 05/31/2022   CL 102 05/31/2022   CREATININE 0.57 05/31/2022   BUN 24 (H) 05/31/2022   CO2 31 05/31/2022   TSH 1.20 01/10/2022   INR 0.84 05/03/2016    CT Head Wo Contrast  Result Date: 05/25/2022 CLINICAL DATA:  Head and neck trauma. Patient fell and hit her head against a pool table. Passed out. EXAM: CT  HEAD WITHOUT CONTRAST CT CERVICAL SPINE WITHOUT CONTRAST TECHNIQUE: Multidetector CT imaging of the head and cervical spine was performed following the standard protocol without intravenous contrast. Multiplanar CT image reconstructions of the cervical spine were also generated. RADIATION DOSE REDUCTION: This exam was performed according to the departmental dose-optimization program which includes automated exposure control, adjustment of the mA and/or kV according to patient size and/or use of iterative reconstruction technique. COMPARISON:  None Available. FINDINGS: CT HEAD FINDINGS Brain: No evidence of acute infarction, hemorrhage, hydrocephalus, extra-axial collection or mass lesion/mass effect. Vascular: No hyperdense vessel or unexpected calcification. Skull: Right forehead soft tissue swelling without evidence of calvarial fracture. Sinuses/Orbits: No acute finding. Other: None. CT CERVICAL SPINE FINDINGS Alignment: Straightening of the cervical spine. Stepwise anterolisthesis at C3, C4. Mild retrolisthesis at C5. Skull base and vertebrae: No acute fracture. No primary bone lesion or focal pathologic process. Soft tissues and spinal canal: No prevertebral fluid or swelling. No visible canal hematoma. Disc levels: C2-C3: Mild right and moderate left facet joint arthropathy. No significant spinal canal or neural foraminal stenosis. C3-C4: Moderate bilateral facet joint arthropathy without significant spinal canal or neural foraminal stenosis. C4-C5: Mild right and moderate left facet joint arthropathy. No significant neural foraminal stenosis. C5-C6: Disc height loss and uncovertebral joint arthropathy with mild left and moderate right neural foraminal stenosis. C6-C7: Disc height loss and uncovertebral joint arthropathy with mild bilateral neural foraminal stenosis, right worse than the left. C7-T1: Mild right facet joint arthropathy. No significant neural foraminal stenosis. Upper chest: Mild emphysematous  changes of the lung apices. Other: None IMPRESSION: CT head: 1. No acute intracranial abnormality. 2. Right frontal soft tissue swelling without evidence of calvarial fracture. CT cervical spine: 1.  No acute fracture or traumatic subluxation. 2. 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 Multilevel neural foraminal stenosis as detailed above. 3.  No acute paraspinal soft tissue abnormality. Electronically Signed   By: Keane Police D.O.   On: 05/25/2022 23:02   CT Cervical Spine Wo Contrast  Result Date: 05/25/2022 CLINICAL DATA:  Head and neck trauma. Patient fell and hit her head against a pool table. Passed out. EXAM: CT HEAD WITHOUT CONTRAST CT CERVICAL SPINE WITHOUT CONTRAST TECHNIQUE: Multidetector CT imaging of the head and cervical spine was performed following the standard protocol without intravenous contrast. Multiplanar CT image reconstructions of the cervical spine were  also generated. RADIATION DOSE REDUCTION: This exam was performed according to the departmental dose-optimization program which includes automated exposure control, adjustment of the mA and/or kV according to patient size and/or use of iterative reconstruction technique. COMPARISON:  None Available. FINDINGS: CT HEAD FINDINGS Brain: No evidence of acute infarction, hemorrhage, hydrocephalus, extra-axial collection or mass lesion/mass effect. Vascular: No hyperdense vessel or unexpected calcification. Skull: Right forehead soft tissue swelling without evidence of calvarial fracture. Sinuses/Orbits: No acute finding. Other: None. CT CERVICAL SPINE FINDINGS Alignment: Straightening of the cervical spine. Stepwise anterolisthesis at C3, C4. Mild retrolisthesis at C5. Skull base and vertebrae: No acute fracture. No primary bone lesion or focal pathologic process. Soft tissues and spinal canal: No prevertebral fluid or swelling. No visible  canal hematoma. Disc levels: C2-C3: Mild right and moderate left facet joint arthropathy. No significant spinal canal or neural foraminal stenosis. C3-C4: Moderate bilateral facet joint arthropathy without significant spinal canal or neural foraminal stenosis. C4-C5: Mild right and moderate left facet joint arthropathy. No significant neural foraminal stenosis. C5-C6: Disc height loss and uncovertebral joint arthropathy with mild left and moderate right neural foraminal stenosis. C6-C7: Disc height loss and uncovertebral joint arthropathy with mild bilateral neural foraminal stenosis, right worse than the left. C7-T1: Mild right facet joint arthropathy. No significant neural foraminal stenosis. Upper chest: Mild emphysematous changes of the lung apices. Other: None IMPRESSION: CT head: 1. No acute intracranial abnormality. 2. Right frontal soft tissue swelling without evidence of calvarial fracture. CT cervical spine: 1.  No acute fracture or traumatic subluxation. 2. 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 Multilevel neural foraminal stenosis as detailed above. 3.  No acute paraspinal soft tissue abnormality. Electronically Signed   By: Keane Police D.O.   On: 05/25/2022 23:02       Assessment & Plan:   Problem List Items Addressed This Visit   None    Einar Pheasant, MD

## 2022-08-03 ENCOUNTER — Other Ambulatory Visit: Payer: Self-pay | Admitting: Internal Medicine

## 2022-08-11 ENCOUNTER — Other Ambulatory Visit: Payer: Self-pay | Admitting: Internal Medicine

## 2022-08-16 ENCOUNTER — Ambulatory Visit: Payer: BC Managed Care – PPO | Admitting: Internal Medicine

## 2022-09-26 ENCOUNTER — Ambulatory Visit: Payer: BC Managed Care – PPO | Admitting: Internal Medicine

## 2022-09-26 VITALS — BP 112/70 | HR 71 | Temp 97.7°F | Resp 16 | Ht 66.0 in | Wt 167.0 lb

## 2022-09-26 DIAGNOSIS — R87619 Unspecified abnormal cytological findings in specimens from cervix uteri: Secondary | ICD-10-CM | POA: Diagnosis not present

## 2022-09-26 DIAGNOSIS — Z1231 Encounter for screening mammogram for malignant neoplasm of breast: Secondary | ICD-10-CM | POA: Diagnosis not present

## 2022-09-26 DIAGNOSIS — R748 Abnormal levels of other serum enzymes: Secondary | ICD-10-CM

## 2022-09-26 DIAGNOSIS — E7800 Pure hypercholesterolemia, unspecified: Secondary | ICD-10-CM | POA: Diagnosis not present

## 2022-09-26 DIAGNOSIS — F439 Reaction to severe stress, unspecified: Secondary | ICD-10-CM

## 2022-09-26 DIAGNOSIS — Z86010 Personal history of colonic polyps: Secondary | ICD-10-CM

## 2022-09-26 MED ORDER — VALACYCLOVIR HCL 500 MG PO TABS
ORAL_TABLET | ORAL | 1 refills | Status: DC
Start: 2022-09-26 — End: 2022-12-26

## 2022-09-26 MED ORDER — CITALOPRAM HYDROBROMIDE 20 MG PO TABS: ORAL_TABLET | ORAL | 1 refills | Status: DC

## 2022-09-26 NOTE — Progress Notes (Signed)
Subjective:    Patient ID: Maria Munoz, female    DOB: Feb 02, 1966, 57 y.o.   MRN: [id number]  Patient here for  Chief Complaint  Patient presents with   Medical Management of Chronic Issues    HPI Here to follow up regarding her cholesterol.  Also last visit, was here for evaluation of syncopal episode.  See last note for details.  Did not f/u with cardiology evaluation.  Has felt good.  No syncope or near syncopal episodes.  Would like to monitor.  Stays active.  No chest pain or sob.  No cough or congestion.  No abdominal pain or bowel change.  Off optiva since 04/2022.  Weight is stable.  Plans to start back.  Felt better.  Handling stress.  Back at [facility].     Past Medical History:  Diagnosis Date   Arthritis    Herpes    H/O   History of abnormal Pap smear    Hypercholesterolemia    Past Surgical History:  Procedure Laterality Date   ANTERIOR CRUCIATE LIGAMENT REPAIR Left 2009   CERVICAL BIOPSY  W/ LOOP ELECTRODE EXCISION     COLONOSCOPY WITH PROPOFOL N/A 10/18/2016   Procedure: COLONOSCOPY WITH PROPOFOL;  Surgeon: Lollie Sails, MD;  Location: [facility] ENDOSCOPY;  Service: Endoscopy;  Laterality: N/A;   COLONOSCOPY WITH PROPOFOL N/A 05/31/2021   Procedure: COLONOSCOPY WITH PROPOFOL;  Surgeon: Annamaria Helling, DO;  Location: [facility] ENDOSCOPY;  Service: Gastroenterology;  Laterality: N/A;   JOINT REPLACEMENT     RT Knee   NOVASURE ABLATION     TOTAL KNEE ARTHROPLASTY Right 05/13/2016   Procedure: RIGHT TOTAL KNEE ARTHROPLASTY;  Surgeon: Gaynelle Arabian, MD;  Location: [facility] ORS;  Service: Orthopedics;  Laterality: Right;   TYMPANOPLASTY     Family History  Problem Relation Age of Onset   Arthritis Mother    Hyperlipidemia Mother    Hypertension Mother    Cancer Father        prostate   Hyperlipidemia Father    Hypertension Father    Alcohol abuse Maternal Grandfather    Breast cancer Neg Hx    Social History   Socioeconomic History   Marital status: Divorced     Spouse name: Not on file   Number of children: Not on file   Years of education: Not on file   Highest education level: Not on file  Occupational History   Not on file  Tobacco Use   Smoking status: Every Day    Packs/day: 0.25    Years: 3.00    Total pack years: 0.75    Types: Cigarettes   Smokeless tobacco: Never   Tobacco comments:    2 cigarettes a day  Vaping Use   Vaping Use: Never used  Substance and Sexual Activity   Alcohol use: Yes    Alcohol/week: 0.0 standard drinks of alcohol    Comment: socially   Drug use: No   Sexual activity: Not on file  Other Topics Concern   Not on file  Social History Narrative   Not on file   Social Determinants of Health   Financial Resource Strain: Not on file  Food Insecurity: Not on file  Transportation Needs: Not on file  Physical Activity: Not on file  Stress: Not on file  Social Connections: Not on file     Review of Systems  Constitutional:  Negative for appetite change and unexpected weight change.  HENT:  Negative for congestion and sinus pressure.  Respiratory:  Negative for cough, chest tightness and shortness of breath.   Cardiovascular:  Negative for chest pain, palpitations and leg swelling.  Gastrointestinal:  Negative for abdominal pain, diarrhea, nausea and vomiting.  Genitourinary:  Negative for difficulty urinating and dysuria.  Musculoskeletal:  Negative for joint swelling and myalgias.  Skin:  Negative for color change and rash.  Neurological:  Negative for dizziness and headaches.  Psychiatric/Behavioral:  Negative for agitation and dysphoric mood.        Objective:     BP 112/70   Pulse 71   Temp 97.7 F (36.5 C)   Resp 16   Ht '5\' 6"'$  (1.676 m)   Wt 167 lb (75.8 kg)   SpO2 98%   BMI 26.95 kg/m  Wt Readings from Last 3 Encounters:  09/26/22 167 lb (75.8 kg)  05/31/22 166 lb (75.3 kg)  05/25/22 160 lb (72.6 kg)    Physical Exam Vitals reviewed.  Constitutional:      General: She is  not in acute distress.    Appearance: Normal appearance.  HENT:     Head: Normocephalic and atraumatic.     Right Ear: External ear normal.     Left Ear: External ear normal.  Eyes:     General: No scleral icterus.       Right eye: No discharge.        Left eye: No discharge.     Conjunctiva/sclera: Conjunctivae normal.  Neck:     Thyroid: No thyromegaly.  Cardiovascular:     Rate and Rhythm: Normal rate and regular rhythm.  Pulmonary:     Effort: No respiratory distress.     Breath sounds: Normal breath sounds. No wheezing.  Abdominal:     General: Bowel sounds are normal.     Palpations: Abdomen is soft.     Tenderness: There is no abdominal tenderness.  Musculoskeletal:        General: No swelling or tenderness.     Cervical back: Neck supple. No tenderness.  Lymphadenopathy:     Cervical: No cervical adenopathy.  Skin:    Findings: No erythema or rash.  Neurological:     Mental Status: She is alert.  Psychiatric:        Mood and Affect: Mood normal.        Behavior: Behavior normal.      Outpatient Encounter Medications as of 09/26/2022  Medication Sig   citalopram (CELEXA) 20 MG tablet TAKE 1 TABLET(20 MG) BY MOUTH DAILY   valACYclovir (VALTREX) 500 MG tablet TAKE 1 TABLET(500 MG) BY MOUTH DAILY   [DISCONTINUED] citalopram (CELEXA) 20 MG tablet TAKE 1 TABLET(20 MG) BY MOUTH DAILY   [DISCONTINUED] sulfamethoxazole-trimethoprim (BACTRIM DS) 800-160 MG tablet Take 1 tablet by mouth 2 (two) times daily.   [DISCONTINUED] valACYclovir (VALTREX) 500 MG tablet TAKE 1 TABLET(500 MG) BY MOUTH DAILY   No facility-administered encounter medications on file as of 09/26/2022.     Lab Results  Component Value Date   WBC 9.7 05/25/2022   HGB 13.9 05/25/2022   HCT 41.3 05/25/2022   PLT 247 05/25/2022   GLUCOSE 126 (H) 05/31/2022   CHOL 195 01/10/2022   TRIG 59.0 01/10/2022   HDL 82.10 01/10/2022   LDLDIRECT 104.1 07/23/2013   LDLCALC 101 (H) 01/10/2022   ALT 16  01/10/2022   AST 16 01/10/2022   NA 140 05/31/2022   K 4.1 05/31/2022   CL 102 05/31/2022   CREATININE 0.57 05/31/2022   BUN 24 (H)  05/31/2022   CO2 31 05/31/2022   TSH 1.20 01/10/2022   INR 0.84 05/03/2016    CT Head Wo Contrast  Result Date: 05/25/2022 CLINICAL DATA:  Head and neck trauma. Patient fell and hit her head against a pool table. Passed out. EXAM: CT HEAD WITHOUT CONTRAST CT CERVICAL SPINE WITHOUT CONTRAST TECHNIQUE: Multidetector CT imaging of the head and cervical spine was performed following the standard protocol without intravenous contrast. Multiplanar CT image reconstructions of the cervical spine were also generated. RADIATION DOSE REDUCTION: This exam was performed according to the departmental dose-optimization program which includes automated exposure control, adjustment of the mA and/or kV according to patient size and/or use of iterative reconstruction technique. COMPARISON:  None Available. FINDINGS: CT HEAD FINDINGS Brain: No evidence of acute infarction, hemorrhage, hydrocephalus, extra-axial collection or mass lesion/mass effect. Vascular: No hyperdense vessel or unexpected calcification. Skull: Right forehead soft tissue swelling without evidence of calvarial fracture. Sinuses/Orbits: No acute finding. Other: None. CT CERVICAL SPINE FINDINGS Alignment: Straightening of the cervical spine. Stepwise anterolisthesis at C3, C4. Mild retrolisthesis at C5. Skull base and vertebrae: No acute fracture. No primary bone lesion or focal pathologic process. Soft tissues and spinal canal: No prevertebral fluid or swelling. No visible canal hematoma. Disc levels: C2-C3: Mild right and moderate left facet joint arthropathy. No significant spinal canal or neural foraminal stenosis. C3-C4: Moderate bilateral facet joint arthropathy without significant spinal canal or neural foraminal stenosis. C4-C5: Mild right and moderate left facet joint arthropathy. No significant neural foraminal  stenosis. C5-C6: Disc height loss and uncovertebral joint arthropathy with mild left and moderate right neural foraminal stenosis. C6-C7: Disc height loss and uncovertebral joint arthropathy with mild bilateral neural foraminal stenosis, right worse than the left. C7-T1: Mild right facet joint arthropathy. No significant neural foraminal stenosis. Upper chest: Mild emphysematous changes of the lung apices. Other: None IMPRESSION: CT head: 1. No acute intracranial abnormality. 2. Right frontal soft tissue swelling without evidence of calvarial fracture. CT cervical spine: 1.  No acute fracture or traumatic subluxation. 2. 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 Multilevel neural foraminal stenosis as detailed above. 3.  No acute paraspinal soft tissue abnormality. Electronically Signed   By: Keane Police D.O.   On: 05/25/2022 23:02   CT Cervical Spine Wo Contrast  Result Date: 05/25/2022 CLINICAL DATA:  Head and neck trauma. Patient fell and hit her head against a pool table. Passed out. EXAM: CT HEAD WITHOUT CONTRAST CT CERVICAL SPINE WITHOUT CONTRAST TECHNIQUE: Multidetector CT imaging of the head and cervical spine was performed following the standard protocol without intravenous contrast. Multiplanar CT image reconstructions of the cervical spine were also generated. RADIATION DOSE REDUCTION: This exam was performed according to the departmental dose-optimization program which includes automated exposure control, adjustment of the mA and/or kV according to patient size and/or use of iterative reconstruction technique. COMPARISON:  None Available. FINDINGS: CT HEAD FINDINGS Brain: No evidence of acute infarction, hemorrhage, hydrocephalus, extra-axial collection or mass lesion/mass effect. Vascular: No hyperdense vessel or unexpected calcification. Skull: Right forehead soft tissue swelling without evidence  of calvarial fracture. Sinuses/Orbits: No acute finding. Other: None. CT CERVICAL SPINE FINDINGS Alignment: Straightening of the cervical spine. Stepwise anterolisthesis at C3, C4. Mild retrolisthesis at C5. Skull base and vertebrae: No acute fracture. No primary bone lesion or focal pathologic process. Soft tissues and spinal canal: No prevertebral fluid or swelling. No visible canal hematoma. Disc levels:  C2-C3: Mild right and moderate left facet joint arthropathy. No significant spinal canal or neural foraminal stenosis. C3-C4: Moderate bilateral facet joint arthropathy without significant spinal canal or neural foraminal stenosis. C4-C5: Mild right and moderate left facet joint arthropathy. No significant neural foraminal stenosis. C5-C6: Disc height loss and uncovertebral joint arthropathy with mild left and moderate right neural foraminal stenosis. C6-C7: Disc height loss and uncovertebral joint arthropathy with mild bilateral neural foraminal stenosis, right worse than the left. C7-T1: Mild right facet joint arthropathy. No significant neural foraminal stenosis. Upper chest: Mild emphysematous changes of the lung apices. Other: None IMPRESSION: CT head: 1. No acute intracranial abnormality. 2. Right frontal soft tissue swelling without evidence of calvarial fracture. CT cervical spine: 1.  No acute fracture or traumatic subluxation. 2. 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 Multilevel neural foraminal stenosis as detailed above. 3.  No acute paraspinal soft tissue abnormality. Electronically Signed   By: Keane Police D.O.   On: 05/25/2022 23:02       Assessment & Plan:  Hypercholesterolemia Assessment & Plan: The 10-year ASCVD risk score (Arnett DK, et al., 2019) is: 3%   Values used to calculate the score:     Age: 67 years     Sex: Female     Is Non-Hispanic African American: No      Diabetic: No     Tobacco smoker: Yes     Systolic Blood Pressure: 287 mmHg     Is BP treated: No     HDL Cholesterol: 82.1 mg/dL     Total Cholesterol: 195 mg/dL  Low cholesterol diet and exercise.  Follow lipid panel.     Orders: -     Lipid panel; Future -     Hepatic function panel; Future -     Basic metabolic panel; Future  Visit for screening mammogram -     3D Screening Mammogram, Left and Right; Future  Abnormal cervical Papanicolaou smear, unspecified abnormal pap finding Assessment & Plan: Previous ASCUS x 2.  Saw gyn.  S/p colposcopy (09/12/21) - negative.  Recommended f/u pap in 6 months. Need to confirm if had f/u.  If so, need records.       Elevated alkaline phosphatase level Assessment & Plan: Follow liver panel.    History of colon polyps Assessment & Plan: Colonoscopy 05/2021 as outlined.  Follow up colonoscopy one year. Need to confirm f/u.    Stress Assessment & Plan: Continue citalopram.  Stable.  Follow.    Other orders -     Citalopram Hydrobromide; TAKE 1 TABLET(20 MG) BY MOUTH DAILY  Dispense: 90 tablet; Refill: 1 -     valACYclovir HCl; TAKE 1 TABLET(500 MG) BY MOUTH DAILY  Dispense: 90 tablet; Refill: 1     Einar Pheasant, MD

## 2022-09-29 ENCOUNTER — Telehealth: Payer: Self-pay | Admitting: Internal Medicine

## 2022-09-29 ENCOUNTER — Encounter: Payer: Self-pay | Admitting: Internal Medicine

## 2022-09-29 NOTE — Assessment & Plan Note (Addendum)
Colonoscopy 05/2021 as outlined.  Follow up colonoscopy one year. Need to confirm f/u.

## 2022-09-29 NOTE — Assessment & Plan Note (Signed)
Follow liver panel.  

## 2022-09-29 NOTE — Assessment & Plan Note (Addendum)
Previous ASCUS x 2.  Saw gyn.  S/p colposcopy (09/12/21) - negative.  Recommended f/u pap in 6 months. Need to confirm if had f/u.  If so, need records.

## 2022-09-29 NOTE — Assessment & Plan Note (Signed)
Continue citalopram.  Stable.  Follow.

## 2022-09-29 NOTE — Assessment & Plan Note (Signed)
The 10-year ASCVD risk score (Arnett DK, et al., 2019) is: 3%   Values used to calculate the score:     Age: 57 years     Sex: Female     Is Non-Hispanic African American: No     Diabetic: No     Tobacco smoker: Yes     Systolic Blood Pressure: 309 mmHg     Is BP treated: No     HDL Cholesterol: 82.1 mg/dL     Total Cholesterol: 195 mg/dL  Low cholesterol diet and exercise.  Follow lipid panel.

## 2022-09-29 NOTE — Telephone Encounter (Signed)
Per review of records, she saw Dr Kenton Kingfisher previously for abnormal pap smear.  Per note, he had recommended a f/u with him.  Has she had the f/up with gyn?  Is she planning to f/u with gyn.  If no, need to schedule repeat pap here in our office.  Also, she saw Dr Virgina Jock for colonoscopy.  Had colonoscopy 05/2021.  Recommended f/u colonoscopy in one year.  Need to know if she had the colonoscopy.  If so, need results.  If not, needs f/u with GI - [facility] - Dr Virgina Jock - for f/u colonoscopy.

## 2022-10-03 NOTE — Telephone Encounter (Signed)
LMTCB

## 2022-10-09 NOTE — Telephone Encounter (Signed)
LMTCB

## 2022-10-10 NOTE — Telephone Encounter (Signed)
My chart sent

## 2022-10-10 NOTE — Telephone Encounter (Signed)
We will do a pap during her next appt - scheduled for a physical in 12/2022.  Also, regarding the colonoscopy, in reviewing, the reason they wanted to repeat the colonoscopy in one year is due to the polyps that were removed.  Given had to be removed piece meal (since large polyp), they recommend one year to confirm that all of the polyp was removed.  I can talk with her more at her next appt if desires.

## 2022-11-21 ENCOUNTER — Encounter: Payer: Self-pay | Admitting: Internal Medicine

## 2022-11-21 DIAGNOSIS — R55 Syncope and collapse: Secondary | ICD-10-CM

## 2022-11-21 NOTE — Telephone Encounter (Signed)
You discussed cardiology referral with her at last visit. Are you ok for me to place referral?

## 2022-11-21 NOTE — Telephone Encounter (Signed)
I am ok with referral to cardiology.  Need to confirm if has a preference of which cardiologist she prefers to see.  Also, she stated no acute problems, please confirm.

## 2022-12-03 NOTE — Telephone Encounter (Signed)
[facility]

## 2022-12-05 NOTE — Progress Notes (Signed)
Cardiology Office Note:    Date:  12/06/2022   ID:  Maria RudeJodi A Fangman, DOB 1966-04-29, MRN [id number]  PCP:  Dale DurhamScott, Charlene, MD   [facility] Providers Cardiologist:  None     Referring MD: Dale DurhamScott, Charlene, MD   CC: syncope Consulted for the evaluation of syncope at the behest of Dr. Lorin PicketScott  History of Present Illness:    Maria Munoz is a 57 y.o. female being evaluated for tobacco who presents for syncope.  Patient notes that she is feeling well. In September of 2023 she had a syncopal episodes; she was choking on a beverage.  She stood up quickly and passed out.  Went to [facility].   Was last feeling well until a month ago and started having weird feelings.  She notes that she had an unusual feeling in her chest.  Feels like chest pressure non exertional pressure.  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  No shortness of breath, DOE .  No PND or orthopnea.  No weight gain, leg swelling , or abdominal swelling.  No syncope or near syncope save for this event . Notes  no palpitations or funny heart beats.     Patient reports prior cardiac testing including her smart watch shows no rhythm issues.   Past Medical History:  Diagnosis Date   Arthritis    Herpes    H/O   History of abnormal Pap smear    Hypercholesterolemia     Past Surgical History:  Procedure Laterality Date   ANTERIOR CRUCIATE LIGAMENT REPAIR Left 2009   CERVICAL BIOPSY  W/ LOOP ELECTRODE EXCISION     COLONOSCOPY WITH PROPOFOL N/A 10/18/2016   Procedure: COLONOSCOPY WITH PROPOFOL;  Surgeon: Christena DeemMartin U Skulskie, MD;  [facility];  Service: Endoscopy;  Laterality: N/A;   COLONOSCOPY WITH PROPOFOL N/A 05/31/2021   Procedure: COLONOSCOPY WITH PROPOFOL;  Surgeon: Jaynie Collinsusso, Steven Michael, DO;  [facility];  Service: Gastroenterology;  Laterality: N/A;   JOINT REPLACEMENT     RT Knee   NOVASURE ABLATION     TOTAL KNEE ARTHROPLASTY Right 05/13/2016    Procedure: RIGHT TOTAL KNEE ARTHROPLASTY;  Surgeon: Ollen GrossFrank Aluisio, MD;  Location: [facility];  Service: Orthopedics;  Laterality: Right;   TYMPANOPLASTY      Current Medications: Current Meds  Medication Sig   citalopram (CELEXA) 20 MG tablet TAKE 1 TABLET(20 MG) BY MOUTH DAILY   Cyanocobalamin (VITAMIN B-12) 5000 MCG SUBL Place under the tongue daily at 6 (six) AM.   Misc Natural Products (OSTEO BI-FLEX ADV DOUBLE ST) TABS Take by mouth daily at 6 (six) AM.   valACYclovir (VALTREX) 500 MG tablet TAKE 1 TABLET(500 MG) BY MOUTH DAILY     Allergies:   Spinach and Ampicillin   Social History   Socioeconomic History   Marital status: Divorced    Spouse name: Not on file   Number of children: Not on file   Years of education: Not on file   Highest education level: Not on file  Occupational History   Not on file  Tobacco Use   Smoking status: Every Day    Packs/day: 0.25    Years: 3.00    Additional pack years: 0.00    Total pack years: 0.75    Types: Cigarettes   Smokeless tobacco: Never   Tobacco comments:    2 cigarettes a day  Vaping Use   Vaping Use: Never used  Substance and Sexual Activity  Alcohol use: Yes    Alcohol/week: 0.0 standard drinks of alcohol    Comment: socially   Drug use: No   Sexual activity: Not on file  Other Topics Concern   Not on file  Social History Narrative   Not on file   Social Determinants of Health   Financial Resource Strain: Not on file  Food Insecurity: Not on file  Transportation Needs: Not on file  Physical Activity: Not on file  Stress: Not on file  Social Connections: Not on file     Family History: The patient's family history includes Alcohol abuse in her maternal grandfather; Arthritis in her mother; Cancer in her father; Hyperlipidemia in her father and mother; Hypertension in her father and mother. There is no history of Breast cancer.  ROS:   Please see the history of present illness.     All other systems reviewed  and are negative.  EKGs/Labs/Other Studies Reviewed:    The following studies were reviewed today:  EKG:  EKG is  ordered today.  The ekg ordered today demonstrates  12/06/22: SR low voltage EKG       Recent Labs: 01/10/2022: ALT 16; TSH 1.20 05/25/2022: Hemoglobin 13.9; Platelets 247 05/31/2022: BUN 24; Creatinine, Ser 0.57; Potassium 4.1; Sodium 140  Recent Lipid Panel    Component Value Date/Time   CHOL 195 01/10/2022 0916   TRIG 59.0 01/10/2022 0916   HDL 82.10 01/10/2022 0916   CHOLHDL 2 01/10/2022 0916   VLDL 11.8 01/10/2022 0916   LDLCALC 101 (H) 01/10/2022 0916   LDLDIRECT 104.1 07/23/2013 0759    Physical Exam:    VS:  BP 115/76   Pulse 73   Ht 5\' 6"  (1.676 m)   Wt 171 lb (77.6 kg)   SpO2 96%   BMI 27.60 kg/m     Wt Readings from Last 3 Encounters:  12/06/22 171 lb (77.6 kg)  09/26/22 167 lb (75.8 kg)  05/31/22 166 lb (75.3 kg)    GEN:  Well nourished, well developed in no acute distress HEENT: Normal NECK: No JVD CARDIAC: RRR, no murmurs, rubs, gallops RESPIRATORY:  Clear to auscultation without rales, wheezing or rhonchi  ABDOMEN: Soft, non-tender, non-distended MUSCULOSKELETAL:  No edema; No deformity  SKIN: Warm and dry NEUROLOGIC:  Alert and oriented x 3 PSYCHIATRIC:  Normal affect   ASSESSMENT:    1. Syncope, unspecified syncope type   2. Orthostatic hypotension   3. Tobacco abuse    PLAN:    Orthostatic hypotension - one isolated episode; pressure has resolved - we discussed the pros and cons of additional testing (ziopatch and echo) - SDM: if further episodes will get this testing  HLD - offered CAC score, patient will let us know if it occurs  Tobacco abuse - discuss cessation   PRN follow up        Medication Adjustments/Labs and Tests Ordered: Current medicines are reviewed at length with the patient today.  Concerns regarding medicines are outlined above.  Orders Placed This Encounter  Procedures   EKG 12-Lead   No  orders of the defined types were placed in this encounter.   Patient Instructions  Medication Instructions:  Your physician recommends that you continue on your current medications as directed. Please refer to the Current Medication list given to you today.  *If you need a refill on your cardiac medications before your next appointment, please call your pharmacy*   Lab Work: NONE If you have labs (blood work) drawn today and  your tests are completely normal, you will receive your results only by: MyChart Message (if you have MyChart) OR A paper copy in the mail If you have any lab test that is abnormal or we need to change your treatment, we will call you to review the results.   Testing/Procedures: NONE   Follow-Up:AS NEEDED At [facility], you and your health needs are our priority.  As part of our continuing mission to provide you with exceptional heart care, we have created designated Provider Care Teams.  These Care Teams include your primary Cardiologist (physician) and Advanced Practice Providers (APPs -  Physician Assistants and Nurse Practitioners) who all work together to provide you with the care you need, when you need it.  Provider:   Riley Lam, MD       Signed, Christell Constant, MD  12/06/2022 3:56 PM    [facility]

## 2022-12-06 ENCOUNTER — Encounter: Payer: Self-pay | Admitting: Internal Medicine

## 2022-12-06 ENCOUNTER — Ambulatory Visit: Payer: BC Managed Care – PPO | Attending: Internal Medicine | Admitting: Internal Medicine

## 2022-12-06 VITALS — BP 115/76 | HR 73 | Ht 66.0 in | Wt 171.0 lb

## 2022-12-06 DIAGNOSIS — Z720 Tobacco use: Secondary | ICD-10-CM | POA: Diagnosis not present

## 2022-12-06 DIAGNOSIS — R55 Syncope and collapse: Secondary | ICD-10-CM

## 2022-12-06 DIAGNOSIS — I951 Orthostatic hypotension: Secondary | ICD-10-CM | POA: Insufficient documentation

## 2022-12-06 NOTE — Patient Instructions (Signed)
Medication Instructions:  Your physician recommends that you continue on your current medications as directed. Please refer to the Current Medication list given to you today.  *If you need a refill on your cardiac medications before your next appointment, please call your pharmacy*   Lab Work: NONE If you have labs (blood work) drawn today and your tests are completely normal, you will receive your results only by: MyChart Message (if you have MyChart) OR A paper copy in the mail If you have any lab test that is abnormal or we need to change your treatment, we will call you to review the results.   Testing/Procedures: NONE   Follow-Up:AS NEEDED At [facility], you and your health needs are our priority.  As part of our continuing mission to provide you with exceptional heart care, we have created designated Provider Care Teams.  These Care Teams include your primary Cardiologist (physician) and Advanced Practice Providers (APPs -  Physician Assistants and Nurse Practitioners) who all work together to provide you with the care you need, when you need it.  Provider:   Riley Lam, MD

## 2022-12-23 ENCOUNTER — Other Ambulatory Visit (INDEPENDENT_AMBULATORY_CARE_PROVIDER_SITE_OTHER): Payer: BC Managed Care – PPO

## 2022-12-23 DIAGNOSIS — E7800 Pure hypercholesterolemia, unspecified: Secondary | ICD-10-CM | POA: Diagnosis not present

## 2022-12-23 LAB — BASIC METABOLIC PANEL
BUN: 18 mg/dL (ref 6–23)
CO2: 29 mEq/L (ref 19–32)
Calcium: 9.5 mg/dL (ref 8.4–10.5)
Chloride: 104 mEq/L (ref 96–112)
Creatinine, Ser: 0.54 mg/dL (ref 0.40–1.20)
GFR: 101.7 mL/min (ref >60.00)
Glucose, Bld: 98 mg/dL (ref 70–99)
Potassium: 4.3 mEq/L (ref 3.5–5.1)
Sodium: 141 mEq/L (ref 135–145)

## 2022-12-23 LAB — HEPATIC FUNCTION PANEL
ALT: 17 U/L (ref 0–35)
AST: 16 U/L (ref 0–37)
Albumin: 4.3 g/dL (ref 3.5–5.2)
Alkaline Phosphatase: 73 U/L (ref 39–117)
Bilirubin, Direct: 0.1 mg/dL (ref 0.0–0.3)
Total Bilirubin: 0.7 mg/dL (ref 0.2–1.2)
Total Protein: 6.5 g/dL (ref 6.0–8.3)

## 2022-12-23 LAB — LIPID PANEL
Cholesterol: 213 mg/dL — ABNORMAL HIGH (ref 0–200)
HDL: 84.6 mg/dL (ref >39.00)
LDL Cholesterol: 115 mg/dL — ABNORMAL HIGH (ref 0–99)
NonHDL: 128.47
Total CHOL/HDL Ratio: 3
Triglycerides: 67 mg/dL (ref 0.0–149.0)
VLDL: 13.4 mg/dL (ref 0.0–40.0)

## 2022-12-26 ENCOUNTER — Encounter: Payer: Self-pay | Admitting: Internal Medicine

## 2022-12-26 ENCOUNTER — Ambulatory Visit (INDEPENDENT_AMBULATORY_CARE_PROVIDER_SITE_OTHER): Payer: BC Managed Care – PPO | Admitting: Internal Medicine

## 2022-12-26 ENCOUNTER — Other Ambulatory Visit (HOSPITAL_COMMUNITY)
Admission: RE | Admit: 2022-12-26 | Discharge: 2022-12-26 | Disposition: A | Discharge status: Home / Self Care | Payer: BC Managed Care – PPO | Source: Ambulatory Visit | Attending: Internal Medicine | Admitting: Internal Medicine

## 2022-12-26 VITALS — BP 106/70 | HR 89 | Temp 98.3°F | Resp 16 | Ht 66.0 in | Wt 171.0 lb

## 2022-12-26 DIAGNOSIS — F439 Reaction to severe stress, unspecified: Secondary | ICD-10-CM

## 2022-12-26 DIAGNOSIS — Z0000 Encounter for general adult medical examination without abnormal findings: Secondary | ICD-10-CM

## 2022-12-26 DIAGNOSIS — E7800 Pure hypercholesterolemia, unspecified: Secondary | ICD-10-CM | POA: Diagnosis not present

## 2022-12-26 DIAGNOSIS — Z860100 Personal history of colon polyps, unspecified: Secondary | ICD-10-CM

## 2022-12-26 DIAGNOSIS — Z1231 Encounter for screening mammogram for malignant neoplasm of breast: Secondary | ICD-10-CM | POA: Diagnosis not present

## 2022-12-26 DIAGNOSIS — R748 Abnormal levels of other serum enzymes: Secondary | ICD-10-CM

## 2022-12-26 DIAGNOSIS — Z124 Encounter for screening for malignant neoplasm of cervix: Secondary | ICD-10-CM | POA: Insufficient documentation

## 2022-12-26 DIAGNOSIS — R55 Syncope and collapse: Secondary | ICD-10-CM

## 2022-12-26 DIAGNOSIS — R87619 Unspecified abnormal cytological findings in specimens from cervix uteri: Secondary | ICD-10-CM

## 2022-12-26 DIAGNOSIS — Z720 Tobacco use: Secondary | ICD-10-CM

## 2022-12-26 MED ORDER — CITALOPRAM HYDROBROMIDE 20 MG PO TABS: ORAL_TABLET | ORAL | 1 refills | Status: DC

## 2022-12-26 MED ORDER — VALACYCLOVIR HCL 500 MG PO TABS: ORAL_TABLET | ORAL | 1 refills | Status: DC

## 2022-12-26 NOTE — Progress Notes (Signed)
Subjective:    Patient ID: Maria Munoz, female    DOB: [birth year], 57 y.o.   MRN: [id number]  Patient here for  Chief Complaint  Patient presents with   Annual Exam    HPI Here for a physical.  Previously saw gyn. Recommended f/u pap here.  No vaginal problems.  Discussed last colonoscopy and reason for recommendation for f/u colonoscopy.  Will notify me if agreeable for referral. Wants to hold for now.  No bowel changes.  No abdominal pain.   Saw cardiology 12/06/22 - discussed calcium score, echo and zio patch. Elected to monitor.  She is currently without symptoms.  Stays active.  No chest pain or sob reported.  No syncope or near syncope.  Discussed calcium score.  Discussed smoking cessation.  Smokes 4 cigarettes per day.    Past Medical History:  Diagnosis Date   Arthritis    Herpes    H/O   History of abnormal Pap smear    Hypercholesterolemia    Past Surgical History:  Procedure Laterality Date   ANTERIOR CRUCIATE LIGAMENT REPAIR Left 2009   CERVICAL BIOPSY  W/ LOOP ELECTRODE EXCISION     COLONOSCOPY WITH PROPOFOL N/A 10/18/2016   Procedure: COLONOSCOPY WITH PROPOFOL;  Surgeon: [name], MD;  Location: [facility] ENDOSCOPY;  Service: Endoscopy;  Laterality: N/A;   COLONOSCOPY WITH PROPOFOL N/A 05/31/2021   Procedure: COLONOSCOPY WITH PROPOFOL;  Surgeon: Jaynie Collins, DO;  Location: [facility]OSCOPY;  Service: Gastroenterology;  Laterality: N/A;   JOINT REPLACEMENT     RT Knee   NOVASURE ABLATION     TOTAL KNEE ARTHROPLASTY Right 05/13/2016   Procedure: RIGHT TOTAL KNEE ARTHROPLASTY;  Surgeon: Ollen Gross, MD;  Location: [facility];  Service: Orthopedics;  Laterality: Right;   TYMPANOPLASTY     Family History  Problem Relation Age of Onset   Arthritis Mother    Hyperlipidemia Mother    Hypertension Mother    Cancer Father        prostate   Hyperlipidemia Father    Hypertension Father    Alcohol abuse Maternal Grandfather    Breast cancer Neg Hx    Social  History   Socioeconomic History   Marital status: Divorced    Spouse name: Not on file   Number of children: Not on file   Years of education: Not on file   Highest education level: Not on file  Occupational History   Not on file  Tobacco Use   Smoking status: Every Day    Packs/day: 0.25    Years: 3.00    Additional pack years: 0.00    Total pack years: 0.75    Types: Cigarettes   Smokeless tobacco: Never   Tobacco comments:    2 cigarettes a day  Vaping Use   Vaping Use: Never used  Substance and Sexual Activity   Alcohol use: Yes    Alcohol/week: 0.0 standard drinks of alcohol    Comment: socially   Drug use: No   Sexual activity: Not on file  Other Topics Concern   Not on file  Social History Narrative   Not on file   Social Determinants of Health   Financial Resource Strain: Not on file  Food Insecurity: Not on file  Transportation Needs: Not on file  Physical Activity: Not on file  Stress: Not on file  Social Connections: Not on file     Review of Systems  Constitutional:  Negative for appetite change and unexpected  weight change.  HENT:  Negative for congestion and sinus pressure.   Respiratory:  Negative for cough, chest tightness and shortness of breath.   Cardiovascular:  Negative for chest pain, palpitations and leg swelling.  Gastrointestinal:  Negative for abdominal pain, diarrhea, nausea and vomiting.  Genitourinary:  Negative for difficulty urinating and dysuria.  Musculoskeletal:  Negative for joint swelling and myalgias.  Skin:  Negative for color change and rash.  Neurological:  Negative for dizziness and headaches.  Psychiatric/Behavioral:  Negative for agitation and dysphoric mood.        Objective:     BP 106/70   Pulse 89   Temp 98.3 F (36.8 C)   Resp 16   Ht 5\' 6"  (1.676 m)   Wt 171 lb (77.6 kg)   SpO2 99%   BMI 27.60 kg/m  Wt Readings from Last 3 Encounters:  12/26/22 171 lb (77.6 kg)  12/06/22 171 lb (77.6 kg)   09/26/22 167 lb (75.8 kg)    Physical Exam Vitals reviewed.  Constitutional:      General: She is not in acute distress.    Appearance: Normal appearance. She is well-developed.  HENT:     Head: Normocephalic and atraumatic.     Right Ear: External ear normal.     Left Ear: External ear normal.  Eyes:     General: No scleral icterus.       Right eye: No discharge.        Left eye: No discharge.     Conjunctiva/sclera: Conjunctivae normal.  Neck:     Thyroid: No thyromegaly.  Cardiovascular:     Rate and Rhythm: Normal rate and regular rhythm.  Pulmonary:     Effort: No tachypnea, accessory muscle usage or respiratory distress.     Breath sounds: Normal breath sounds. No decreased breath sounds, wheezing or rhonchi.  Chest:  Breasts:    Right: No inverted nipple, mass, nipple discharge or tenderness (no axillary adenopathy).     Left: No inverted nipple, mass, nipple discharge or tenderness (no axilarry adenopathy).  Abdominal:     General: Bowel sounds are normal.     Palpations: Abdomen is soft.     Tenderness: There is no abdominal tenderness.  Genitourinary:    Comments: Normal external genitalia.  Vaginal vault without lesions.  Cervix identified.  Pap smear performed.  Could not appreciate any adnexal masses or tenderness.   Musculoskeletal:        General: No swelling or tenderness.     Cervical back: Neck supple.  Lymphadenopathy:     Cervical: No cervical adenopathy.  Skin:    General: Skin is warm.     Findings: No erythema or rash.  Neurological:     Mental Status: She is alert and oriented to person, place, and time.  Psychiatric:        Mood and Affect: Mood normal.        Behavior: Behavior normal.      Outpatient Encounter Medications as of 12/26/2022  Medication Sig   citalopram (CELEXA) 20 MG tablet TAKE 1 TABLET(20 MG) BY MOUTH DAILY   Cyanocobalamin (VITAMIN B-12) 5000 MCG SUBL Place under the tongue daily at 6 (six) AM.   Misc Natural  Products (OSTEO BI-FLEX ADV DOUBLE ST) TABS Take by mouth daily at 6 (six) AM.   valACYclovir (VALTREX) 500 MG tablet TAKE 1 TABLET(500 MG) BY MOUTH DAILY   [DISCONTINUED] citalopram (CELEXA) 20 MG tablet TAKE 1 TABLET(20 MG) BY MOUTH DAILY   [  DISCONTINUED] valACYclovir (VALTREX) 500 MG tablet TAKE 1 TABLET(500 MG) BY MOUTH DAILY   No facility-administered encounter medications on file as of 12/26/2022.     Lab Results  Component Value Date   WBC 9.7 05/25/2022   HGB 13.9 05/25/2022   HCT 41.3 05/25/2022   PLT 247 05/25/2022   GLUCOSE 98 12/23/2022   CHOL 213 (H) 12/23/2022   TRIG 67.0 12/23/2022   HDL 84.60 12/23/2022   LDLDIRECT 104.1 07/23/2013   LDLCALC 115 (H) 12/23/2022   ALT 17 12/23/2022   AST 16 12/23/2022   NA 141 12/23/2022   K 4.3 12/23/2022   CL 104 12/23/2022   CREATININE 0.54 12/23/2022   BUN 18 12/23/2022   CO2 29 12/23/2022   TSH 1.20 01/10/2022   INR 0.84 05/03/2016    CT Head Wo Contrast  Result Date: 05/25/2022 CLINICAL DATA:  Head and neck trauma. Patient fell and hit her head against a pool table. Passed out. EXAM: CT HEAD WITHOUT CONTRAST CT CERVICAL SPINE WITHOUT CONTRAST TECHNIQUE: Multidetector CT imaging of the head and cervical spine was performed following the standard protocol without intravenous contrast. Multiplanar CT image reconstructions of the cervical spine were also generated. RADIATION DOSE REDUCTION: This exam was performed according to the departmental dose-optimization program which includes automated exposure control, adjustment of the mA and/or kV according to patient size and/or use of iterative reconstruction technique. COMPARISON:  None Available. FINDINGS: CT HEAD FINDINGS Brain: No evidence of acute infarction, hemorrhage, hydrocephalus, extra-axial collection or mass lesion/mass effect. Vascular: No hyperdense vessel or unexpected calcification. Skull: Right forehead soft tissue swelling without evidence of calvarial fracture.  Sinuses/Orbits: No acute finding. Other: None. CT CERVICAL SPINE FINDINGS Alignment: Straightening of the cervical spine. Stepwise anterolisthesis at C3, C4. Mild retrolisthesis at C5. Skull base and vertebrae: No acute fracture. No primary bone lesion or focal pathologic process. Soft tissues and spinal canal: No prevertebral fluid or swelling. No visible canal hematoma. Disc levels: C2-C3: Mild right and moderate left facet joint arthropathy. No significant spinal canal or neural foraminal stenosis. C3-C4: Moderate bilateral facet joint arthropathy without significant spinal canal or neural foraminal stenosis. C4-C5: Mild right and moderate left facet joint arthropathy. No significant neural foraminal stenosis. C5-C6: Disc height loss and uncovertebral joint arthropathy with mild left and moderate right neural foraminal stenosis. C6-C7: Disc height loss and uncovertebral joint arthropathy with mild bilateral neural foraminal stenosis, right worse than the left. C7-T1: Mild right facet joint arthropathy. No significant neural foraminal stenosis. Upper chest: Mild emphysematous changes of the lung apices. Other: None IMPRESSION: CT head: 1. No acute intracranial abnormality. 2. Right frontal soft tissue swelling without evidence of calvarial fracture. CT cervical spine: 1.  No acute fracture or traumatic subluxation. 2. 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 Multilevel neural foraminal stenosis as detailed above. 3.  No acute paraspinal soft tissue abnormality. Electronically Signed   By: Larose Hires D.O.   On: 05/25/2022 23:02   CT Cervical Spine Wo Contrast  Result Date: 05/25/2022 CLINICAL DATA:  Head and neck trauma. Patient fell and hit her head against a pool table. Passed out. EXAM: CT HEAD WITHOUT CONTRAST CT CERVICAL SPINE WITHOUT CONTRAST TECHNIQUE: Multidetector CT imaging of the head and  cervical spine was performed following the standard protocol without intravenous contrast. Multiplanar CT image reconstructions of the cervical spine were also generated. RADIATION DOSE REDUCTION: This exam was performed according to the  departmental dose-optimization program which includes automated exposure control, adjustment of the mA and/or kV according to patient size and/or use of iterative reconstruction technique. COMPARISON:  None Available. FINDINGS: CT HEAD FINDINGS Brain: No evidence of acute infarction, hemorrhage, hydrocephalus, extra-axial collection or mass lesion/mass effect. Vascular: No hyperdense vessel or unexpected calcification. Skull: Right forehead soft tissue swelling without evidence of calvarial fracture. Sinuses/Orbits: No acute finding. Other: None. CT CERVICAL SPINE FINDINGS Alignment: Straightening of the cervical spine. Stepwise anterolisthesis at C3, C4. Mild retrolisthesis at C5. Skull base and vertebrae: No acute fracture. No primary bone lesion or focal pathologic process. Soft tissues and spinal canal: No prevertebral fluid or swelling. No visible canal hematoma. Disc levels: C2-C3: Mild right and moderate left facet joint arthropathy. No significant spinal canal or neural foraminal stenosis. C3-C4: Moderate bilateral facet joint arthropathy without significant spinal canal or neural foraminal stenosis. C4-C5: Mild right and moderate left facet joint arthropathy. No significant neural foraminal stenosis. C5-C6: Disc height loss and uncovertebral joint arthropathy with mild left and moderate right neural foraminal stenosis. C6-C7: Disc height loss and uncovertebral joint arthropathy with mild bilateral neural foraminal stenosis, right worse than the left. C7-T1: Mild right facet joint arthropathy. No significant neural foraminal stenosis. Upper chest: Mild emphysematous changes of the lung apices. Other: None IMPRESSION: CT head: 1. No acute intracranial abnormality. 2. Right  frontal soft tissue swelling without evidence of calvarial fracture. CT cervical spine: 1.  No acute fracture or traumatic subluxation. 2. 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 Multilevel neural foraminal stenosis as detailed above. 3.  No acute paraspinal soft tissue abnormality. Electronically Signed   By: Larose Hires D.O.   On: 05/25/2022 23:02       Assessment & Plan:  Routine general medical examination at a health care facility  Hypercholesterolemia Assessment & Plan: The 10-year ASCVD risk score (Arnett DK, et al., 2019) is: 3.1%   Values used to calculate the score:     Age: 58 years     Sex: Female     Is Non-Hispanic African American: No     Diabetic: No     Tobacco smoker: Yes     Systolic Blood Pressure: 106 mmHg     Is BP treated: No     HDL Cholesterol: 84.6 mg/dL     Total Cholesterol: 213 mg/dL  Low cholesterol diet and exercise.  Follow lipid panel.     Orders: -     CBC with Differential/Platelet; Future -     Basic metabolic panel; Future -     Lipid panel; Future -     TSH; Future -     Hepatic function panel; Future  Health care maintenance Assessment & Plan: Physical today 12/26/22.  Colposcopy 09/12/21 - negative.  Recommended f/u 6 months.  PAP today. Colonoscopy 05/2021 - recommended f/u in one year.  Discussed. Declines to schedule at this time.  Will notify when agreeable.  Mammogram 01/14/22 - Birads I. Schedule f/u mammogram.    Screening for cervical cancer -     Cytology - PAP  Visit for screening mammogram -     3D Screening Mammogram, Left and Right; Future  Abnormal cervical Papanicolaou smear, unspecified abnormal pap finding Assessment & Plan: Previous ASCUS x 2.  Saw gyn.  S/p colposcopy (09/12/21).  Recommended repeat pap at out office.  Pap today.    Elevated alkaline phosphatase level Assessment & Plan:  Alkaline phos wnl recent  check.  Follow.    History of colon polyps Assessment & Plan: Colonoscopy 05/2021 as outlined.  Follow up colonoscopy one year. Discussed.  She declines at this time.  Will notify when agreeable for referral.    Stress Assessment & Plan: Continue citalopram.  Stable.  Follow.    Syncope, unspecified syncope type Assessment & Plan:  Saw cardiology 12/06/22 - discussed calcium score, echo and zio patch. Elected to monitor.  She is currently without symptoms.  Stays active.  No chest pain or sob reported.  No syncope or near syncope.  Discussed calcium score.    Tobacco abuse Assessment & Plan: Discussed smoking cessation.  Smoking 4 cigarettes per day and only smoked 4 cigarettes per day.  Follow.    Other orders -     Citalopram Hydrobromide; TAKE 1 TABLET(20 MG) BY MOUTH DAILY  Dispense: 90 tablet; Refill: 1 -     valACYclovir HCl; TAKE 1 TABLET(500 MG) BY MOUTH DAILY  Dispense: 90 tablet; Refill: 1     Dale [place], MD

## 2022-12-26 NOTE — Assessment & Plan Note (Signed)
The 10-year ASCVD risk score (Arnett DK, et al., 2019) is: 3.1%   Values used to calculate the score:     Age: 57 years     Sex: Female     Is Non-Hispanic African American: No     Diabetic: No     Tobacco smoker: Yes     Systolic Blood Pressure: 106 mmHg     Is BP treated: No     HDL Cholesterol: 84.6 mg/dL     Total Cholesterol: 213 mg/dL  Low cholesterol diet and exercise.  Follow lipid panel.

## 2022-12-26 NOTE — Assessment & Plan Note (Addendum)
Physical today 12/26/22.  Colposcopy 09/12/21 - negative.  Recommended f/u 6 months.  PAP today. Colonoscopy 05/2021 - recommended f/u in one year.  Discussed. Declines to schedule at this time.  Will notify when agreeable.  Mammogram 01/14/22 - Birads I. Schedule f/u mammogram.

## 2022-12-29 ENCOUNTER — Encounter: Payer: Self-pay | Admitting: Internal Medicine

## 2022-12-29 NOTE — Assessment & Plan Note (Signed)
Alkaline phos wnl recent check.  Follow.

## 2022-12-29 NOTE — Assessment & Plan Note (Signed)
Continue citalopram.  Stable.  Follow.  

## 2022-12-29 NOTE — Assessment & Plan Note (Signed)
Previous ASCUS x 2.  Saw gyn.  S/p colposcopy (09/12/21).  Recommended repeat pap at out office.  Pap today.

## 2022-12-29 NOTE — Assessment & Plan Note (Signed)
Discussed smoking cessation.  Smoking 4 cigarettes per day and only smoked 4 cigarettes per day.  Follow.

## 2022-12-29 NOTE — Assessment & Plan Note (Signed)
Colonoscopy 05/2021 as outlined.  Follow up colonoscopy one year. Discussed.  She declines at this time.  Will notify when agreeable for referral.

## 2022-12-29 NOTE — Assessment & Plan Note (Signed)
Saw cardiology 12/06/22 - discussed calcium score, echo and zio patch. Elected to monitor.  She is currently without symptoms.  Stays active.  No chest pain or sob reported.  No syncope or near syncope.  Discussed calcium score.

## 2022-12-31 LAB — CYTOLOGY - PAP
Comment: NEGATIVE
Diagnosis: UNDETERMINED — AB
High risk HPV: NEGATIVE

## 2023-01-13 ENCOUNTER — Telehealth: Payer: Self-pay

## 2023-01-13 NOTE — Telephone Encounter (Signed)
-----   Message from Dale Batavia, MD sent at 01/11/2023  9:13 AM EDT ----- Maria Munoz that her pap smear still reveals ASCUS.  We discussed this at her appt.  HPV is negative.  I did contact gyn. Given her history of given persistent ASCUS, I would like to refer to GYN for further evaluation and treatment.  See if she has a preference of which gyn she prefers to see.  If no preference, Dr Dalbert Garnet is who I contacted and can place order for referral to Dr Dalbert Garnet. Please make note in referral that I spoke to Dr Dalbert Garnet

## 2023-01-14 ENCOUNTER — Other Ambulatory Visit: Payer: Self-pay

## 2023-01-14 DIAGNOSIS — R87619 Unspecified abnormal cytological findings in specimens from cervix uteri: Secondary | ICD-10-CM

## 2023-01-23 ENCOUNTER — Ambulatory Visit
Admission: RE | Admit: 2023-01-23 | Discharge: 2023-01-23 | Disposition: A | Discharge status: Home / Self Care | Payer: BC Managed Care – PPO | Source: Ambulatory Visit | Attending: Internal Medicine | Admitting: Internal Medicine

## 2023-01-23 DIAGNOSIS — Z1231 Encounter for screening mammogram for malignant neoplasm of breast: Secondary | ICD-10-CM | POA: Diagnosis present

## 2023-02-06 ENCOUNTER — Other Ambulatory Visit: Payer: Self-pay | Admitting: Internal Medicine

## 2023-04-12 ENCOUNTER — Other Ambulatory Visit: Payer: Self-pay | Admitting: Internal Medicine

## 2023-06-25 ENCOUNTER — Other Ambulatory Visit: Payer: BC Managed Care – PPO

## 2023-06-25 DIAGNOSIS — E7800 Pure hypercholesterolemia, unspecified: Secondary | ICD-10-CM | POA: Diagnosis not present

## 2023-06-25 LAB — BASIC METABOLIC PANEL
BUN: 13 mg/dL (ref 6–23)
CO2: 29 meq/L (ref 19–32)
Calcium: 9.5 mg/dL (ref 8.4–10.5)
Chloride: 103 meq/L (ref 96–112)
Creatinine, Ser: 0.58 mg/dL (ref 0.40–1.20)
GFR: 99.61 mL/min (ref >60.00)
Glucose, Bld: 86 mg/dL (ref 70–99)
Potassium: 4 meq/L (ref 3.5–5.1)
Sodium: 140 meq/L (ref 135–145)

## 2023-06-25 LAB — CBC WITH DIFFERENTIAL/PLATELET
Basophils Absolute: 0.1 10*3/uL (ref 0.0–0.1)
Basophils Relative: 0.8 % (ref 0.0–3.0)
Eosinophils Absolute: 0.3 10*3/uL (ref 0.0–0.7)
Eosinophils Relative: 3.4 % (ref 0.0–5.0)
HCT: 43.1 % (ref 36.0–46.0)
Hemoglobin: 14.5 g/dL (ref 12.0–15.0)
Lymphocytes Relative: 35.7 % (ref 12.0–46.0)
Lymphs Abs: 2.9 10*3/uL (ref 0.7–4.0)
MCHC: 33.7 g/dL (ref 30.0–36.0)
MCV: 96.1 fL (ref 78.0–100.0)
Monocytes Absolute: 0.5 10*3/uL (ref 0.1–1.0)
Monocytes Relative: 6 % (ref 3.0–12.0)
Neutro Abs: 4.4 10*3/uL (ref 1.4–7.7)
Neutrophils Relative %: 54.1 % (ref 43.0–77.0)
Platelets: 248 10*3/uL (ref 150.0–400.0)
RBC: 4.48 Mil/uL (ref 3.87–5.11)
RDW: 12.8 % (ref 11.5–15.5)
WBC: 8.2 10*3/uL (ref 4.0–10.5)

## 2023-06-25 LAB — TSH: TSH: 1.8 u[IU]/mL (ref 0.35–5.50)

## 2023-06-25 LAB — HEPATIC FUNCTION PANEL
ALT: 16 U/L (ref 0–35)
AST: 16 U/L (ref 0–37)
Albumin: 4.3 g/dL (ref 3.5–5.2)
Alkaline Phosphatase: 57 U/L (ref 39–117)
Bilirubin, Direct: 0.1 mg/dL (ref 0.0–0.3)
Total Bilirubin: 0.5 mg/dL (ref 0.2–1.2)
Total Protein: 6.8 g/dL (ref 6.0–8.3)

## 2023-06-25 LAB — LIPID PANEL
Cholesterol: 183 mg/dL (ref 0–200)
HDL: 73.1 mg/dL (ref >39.00)
LDL Cholesterol: 95 mg/dL (ref 0–99)
NonHDL: 109.65
Total CHOL/HDL Ratio: 3
Triglycerides: 71 mg/dL (ref 0.0–149.0)
VLDL: 14.2 mg/dL (ref 0.0–40.0)

## 2023-06-27 ENCOUNTER — Encounter: Payer: Self-pay | Admitting: Internal Medicine

## 2023-06-27 ENCOUNTER — Ambulatory Visit: Payer: BC Managed Care – PPO | Admitting: Internal Medicine

## 2023-06-27 VITALS — BP 114/70 | HR 72 | Temp 98.3°F | Resp 16 | Ht 66.0 in | Wt 172.6 lb

## 2023-06-27 DIAGNOSIS — F439 Reaction to severe stress, unspecified: Secondary | ICD-10-CM | POA: Diagnosis not present

## 2023-06-27 DIAGNOSIS — E7800 Pure hypercholesterolemia, unspecified: Secondary | ICD-10-CM

## 2023-06-27 DIAGNOSIS — Z860100 Personal history of colon polyps, unspecified: Secondary | ICD-10-CM

## 2023-06-27 DIAGNOSIS — R87619 Unspecified abnormal cytological findings in specimens from cervix uteri: Secondary | ICD-10-CM

## 2023-06-27 DIAGNOSIS — Z720 Tobacco use: Secondary | ICD-10-CM | POA: Diagnosis not present

## 2023-06-27 NOTE — Progress Notes (Unsigned)
Subjective:    Patient ID: Maria Munoz, female    DOB: [birth year], 57 y.o.   MRN: [id number]  Patient here for  Chief Complaint  Patient presents with   Medical Management of Chronic Issues    HPI Here for a scheduled follow up - f/u regarding increased stress and hypercholesterolemia. Saw cardiology 12/06/22 - discussed calcium score, echo and zio patch. Elected to monitor. Saw gyn 04/07/23 - pap ASCUS/HPV negative. Colposcopy ok.  Planning continued follow up with gyn.  Staying active. Handling stress.  No chest pain or sob reported.  Still smoking - several cigarettes per day.  Will let me know if desires any further intervention to help quit.  Currently on semaglutide (from health bar). Tolerated .25 mg dose.  Discussed possible side effects of the medication.  Had problems with the .5mg  dose and plans to decrease back down to .25mg .  Has lost approximately 7 pounds.  Overall she feels she is doing well.    Past Medical History:  Diagnosis Date   Arthritis    Herpes    H/O   History of abnormal Pap smear    Hypercholesterolemia    Past Surgical History:  Procedure Laterality Date   ANTERIOR CRUCIATE LIGAMENT REPAIR Left 2009   CERVICAL BIOPSY  W/ LOOP ELECTRODE EXCISION     COLONOSCOPY WITH PROPOFOL N/A 10/18/2016   Procedure: COLONOSCOPY WITH PROPOFOL;  Surgeon: [name], MD;  Location: [facility] ENDOSCOPY;  Service: Endoscopy;  Laterality: N/A;   COLONOSCOPY WITH PROPOFOL N/A 05/31/2021   Procedure: COLONOSCOPY WITH PROPOFOL;  Surgeon: Jaynie Collins, DO;  Location: [facility] ENDOSCOPY;  Service: Gastroenterology;  Laterality: N/A;   JOINT REPLACEMENT     RT Knee   NOVASURE ABLATION     TOTAL KNEE ARTHROPLASTY Right 05/13/2016   Procedure: RIGHT TOTAL KNEE ARTHROPLASTY;  Surgeon: Ollen Gross, MD;  Location: [facility] ORS;  Service: Orthopedics;  Laterality: Right;   TYMPANOPLASTY     Family History  Problem Relation Age of Onset   Arthritis Mother    Hyperlipidemia Mother     Hypertension Mother    Cancer Father        prostate   Hyperlipidemia Father    Hypertension Father    Alcohol abuse Maternal Grandfather    Breast cancer Neg Hx    Social History   Socioeconomic History   Marital status: Divorced    Spouse name: Not on file   Number of children: Not on file   Years of education: Not on file   Highest education level: Not on file  Occupational History   Not on file  Tobacco Use   Smoking status: Every Day    Current packs/day: 0.25    Average packs/day: 0.3 packs/day for 3.0 years (0.8 ttl pk-yrs)    Types: Cigarettes   Smokeless tobacco: Never   Tobacco comments:    2 cigarettes a day  Vaping Use   Vaping status: Never Used  Substance and Sexual Activity   Alcohol use: Yes    Alcohol/week: 0.0 standard drinks of alcohol    Comment: socially   Drug use: No   Sexual activity: Not on file  Other Topics Concern   Not on file  Social History Narrative   Not on file   Social Determinants of Health   Financial Resource Strain: Not on file  Food Insecurity: Not on file  Transportation Needs: Not on file  Physical Activity: Not on file  Stress: Not on file  Social Connections: Not on file     Review of Systems  Constitutional:  Negative for appetite change and unexpected weight change.  HENT:  Negative for congestion and sinus pressure.   Respiratory:  Negative for cough, chest tightness and shortness of breath.   Cardiovascular:  Negative for chest pain, palpitations and leg swelling.  Gastrointestinal:  Negative for abdominal pain, diarrhea, nausea and vomiting.  Genitourinary:  Negative for difficulty urinating and dysuria.  Musculoskeletal:  Negative for joint swelling and myalgias.  Skin:  Negative for color change and rash.  Neurological:  Negative for dizziness and headaches.  Psychiatric/Behavioral:  Negative for agitation and dysphoric mood.        Objective:     BP 114/70   Pulse 72   Temp 98.3 F (36.8 C)    Resp 16   Ht 5\' 6"  (1.676 m)   Wt 172 lb 9.6 oz (78.3 kg)   SpO2 98%   BMI 27.86 kg/m  Wt Readings from Last 3 Encounters:  06/27/23 172 lb 9.6 oz (78.3 kg)  12/26/22 171 lb (77.6 kg)  12/06/22 171 lb (77.6 kg)    Physical Exam Vitals reviewed.  Constitutional:      General: She is not in acute distress.    Appearance: Normal appearance.  HENT:     Head: Normocephalic and atraumatic.     Right Ear: External ear normal.     Left Ear: External ear normal.  Eyes:     General: No scleral icterus.       Right eye: No discharge.        Left eye: No discharge.     Conjunctiva/sclera: Conjunctivae normal.  Neck:     Thyroid: No thyromegaly.  Cardiovascular:     Rate and Rhythm: Normal rate and regular rhythm.  Pulmonary:     Effort: No respiratory distress.     Breath sounds: Normal breath sounds. No wheezing.  Abdominal:     General: Bowel sounds are normal.     Palpations: Abdomen is soft.     Tenderness: There is no abdominal tenderness.  Musculoskeletal:        General: No swelling or tenderness.     Cervical back: Neck supple. No tenderness.  Lymphadenopathy:     Cervical: No cervical adenopathy.  Skin:    Findings: No erythema or rash.  Neurological:     Mental Status: She is alert.  Psychiatric:        Mood and Affect: Mood normal.        Behavior: Behavior normal.      Outpatient Encounter Medications as of 06/27/2023  Medication Sig   citalopram (CELEXA) 20 MG tablet TAKE 1 TABLET(20 MG) BY MOUTH DAILY   Cyanocobalamin (VITAMIN B-12) 5000 MCG SUBL Place under the tongue daily at 6 (six) AM.   Misc Natural Products (OSTEO BI-FLEX ADV DOUBLE ST) TABS Take by mouth daily at 6 (six) AM.   valACYclovir (VALTREX) 500 MG tablet TAKE 1 TABLET(500 MG) BY MOUTH DAILY   No facility-administered encounter medications on file as of 06/27/2023.     Lab Results  Component Value Date   WBC 8.2 06/25/2023   HGB 14.5 06/25/2023   HCT 43.1 06/25/2023   PLT 248.0  06/25/2023   GLUCOSE 86 06/25/2023   CHOL 183 06/25/2023   TRIG 71.0 06/25/2023   HDL 73.10 06/25/2023   LDLDIRECT 104.1 07/23/2013   LDLCALC 95 06/25/2023   ALT 16 06/25/2023   AST 16 06/25/2023  NA 140 06/25/2023   K 4.0 06/25/2023   CL 103 06/25/2023   CREATININE 0.58 06/25/2023   BUN 13 06/25/2023   CO2 29 06/25/2023   TSH 1.80 06/25/2023   INR 0.84 05/03/2016    MM 3D SCREENING MAMMOGRAM BILATERAL BREAST  Result Date: 01/24/2023 CLINICAL DATA:  Screening. EXAM: DIGITAL SCREENING BILATERAL MAMMOGRAM WITH TOMOSYNTHESIS AND CAD TECHNIQUE: Bilateral screening digital craniocaudal and mediolateral oblique mammograms were obtained. Bilateral screening digital breast tomosynthesis was performed. The images were evaluated with computer-aided detection. COMPARISON:  Previous exam(s). ACR Breast Density Category b: There are scattered areas of fibroglandular density. FINDINGS: There are no findings suspicious for malignancy. IMPRESSION: No mammographic evidence of malignancy. A result letter of this screening mammogram will be mailed directly to the patient. RECOMMENDATION: Screening mammogram in one year. (Code:SM-B-01Y) BI-RADS CATEGORY  1: Negative. Electronically Signed   By: Emmaline Kluver M.D.   On: 01/24/2023 14:59       Assessment & Plan:  There are no diagnoses linked to this encounter.   Dale Harrison, MD

## 2023-06-27 NOTE — Assessment & Plan Note (Signed)
The 10-year ASCVD risk score (Arnett DK, et al., 2019) is: 3.5%   Values used to calculate the score:     Age: 57 years     Sex: Female     Is Non-Hispanic African American: No     Diabetic: No     Tobacco smoker: Yes     Systolic Blood Pressure: 114 mmHg     Is BP treated: No     HDL Cholesterol: 73.1 mg/dL     Total Cholesterol: 183 mg/dL  Low cholesterol diet and exercise.  Follow lipid panel.

## 2023-06-29 ENCOUNTER — Encounter: Payer: Self-pay | Admitting: Internal Medicine

## 2023-06-29 NOTE — Assessment & Plan Note (Signed)
Colonoscopy 05/2021 as outlined.  Follow up colonoscopy one year. Discussed.  She declines at this time.  Will notify when agreeable for referral.

## 2023-06-29 NOTE — Assessment & Plan Note (Signed)
Continue citalopram.  Stable.  Follow.

## 2023-06-29 NOTE — Assessment & Plan Note (Signed)
Discussed smoking cessation.  Smoking 4 cigarettes per day.  Will notify me if desires any further intervention to help quit. Follow.

## 2023-06-29 NOTE — Assessment & Plan Note (Signed)
Saw gyn 04/07/23 - pap ASCUS/HPV negative. Colposcopy ok.  Planning continued follow up with gyn.

## 2023-10-13 ENCOUNTER — Telehealth: Payer: Self-pay | Admitting: Internal Medicine

## 2023-10-13 DIAGNOSIS — E7800 Pure hypercholesterolemia, unspecified: Secondary | ICD-10-CM

## 2023-10-13 NOTE — Telephone Encounter (Signed)
 Patient need lab orders.

## 2023-10-13 NOTE — Telephone Encounter (Signed)
Orders placed for f/u labs.  

## 2023-10-14 ENCOUNTER — Encounter: Payer: Self-pay | Admitting: Internal Medicine

## 2023-10-21 ENCOUNTER — Other Ambulatory Visit: Payer: Self-pay | Admitting: Internal Medicine

## 2023-10-21 ENCOUNTER — Telehealth: Payer: Self-pay

## 2023-10-21 NOTE — Telephone Encounter (Signed)
 Error

## 2023-10-22 ENCOUNTER — Other Ambulatory Visit: Payer: BC Managed Care – PPO

## 2023-10-24 ENCOUNTER — Other Ambulatory Visit: Payer: Self-pay | Admitting: Internal Medicine

## 2023-10-27 ENCOUNTER — Other Ambulatory Visit (INDEPENDENT_AMBULATORY_CARE_PROVIDER_SITE_OTHER): Payer: 59

## 2023-10-27 DIAGNOSIS — E7800 Pure hypercholesterolemia, unspecified: Secondary | ICD-10-CM | POA: Diagnosis not present

## 2023-10-27 LAB — BASIC METABOLIC PANEL
BUN: 14 mg/dL (ref 6–23)
CO2: 28 meq/L (ref 19–32)
Calcium: 9.6 mg/dL (ref 8.4–10.5)
Chloride: 103 meq/L (ref 96–112)
Creatinine, Ser: 0.51 mg/dL (ref 0.40–1.20)
GFR: 102.5 mL/min (ref >60.00)
Glucose, Bld: 92 mg/dL (ref 70–99)
Potassium: 4.1 meq/L (ref 3.5–5.1)
Sodium: 140 meq/L (ref 135–145)

## 2023-10-27 LAB — LIPID PANEL
Cholesterol: 196 mg/dL (ref 0–200)
HDL: 80.6 mg/dL (ref >39.00)
LDL Cholesterol: 104 mg/dL — ABNORMAL HIGH (ref 0–99)
NonHDL: 115.58
Total CHOL/HDL Ratio: 2
Triglycerides: 60 mg/dL (ref 0.0–149.0)
VLDL: 12 mg/dL (ref 0.0–40.0)

## 2023-10-27 LAB — HEPATIC FUNCTION PANEL
ALT: 15 U/L (ref 0–35)
AST: 15 U/L (ref 0–37)
Albumin: 4.5 g/dL (ref 3.5–5.2)
Alkaline Phosphatase: 64 U/L (ref 39–117)
Bilirubin, Direct: 0.1 mg/dL (ref 0.0–0.3)
Total Bilirubin: 0.5 mg/dL (ref 0.2–1.2)
Total Protein: 7.3 g/dL (ref 6.0–8.3)

## 2023-10-28 ENCOUNTER — Ambulatory Visit: Payer: 59 | Admitting: Internal Medicine

## 2023-10-28 ENCOUNTER — Encounter: Payer: Self-pay | Admitting: Internal Medicine

## 2023-10-28 VITALS — BP 112/68 | HR 85 | Temp 97.9°F | Resp 16 | Ht 66.0 in | Wt 170.0 lb

## 2023-10-28 DIAGNOSIS — E7800 Pure hypercholesterolemia, unspecified: Secondary | ICD-10-CM

## 2023-10-28 DIAGNOSIS — R87619 Unspecified abnormal cytological findings in specimens from cervix uteri: Secondary | ICD-10-CM

## 2023-10-28 DIAGNOSIS — F439 Reaction to severe stress, unspecified: Secondary | ICD-10-CM

## 2023-10-28 DIAGNOSIS — Z720 Tobacco use: Secondary | ICD-10-CM

## 2023-10-28 DIAGNOSIS — Z860100 Personal history of colon polyps, unspecified: Secondary | ICD-10-CM

## 2023-10-28 NOTE — Progress Notes (Signed)
 Subjective:    Patient ID: Maria Munoz, female    DOB: 04-13-1966, 58 y.o.   MRN: [id number]  Patient here for  Chief Complaint  Patient presents with   Medical Management of Chronic Issues    HPI Here for a scheduled follow up - - f/u regarding increased stress and hypercholesterolemia. Saw cardiology 12/06/22 - discussed calcium score, echo and zio patch. Elected to monitor. Saw gyn 04/07/23 - pap ASCUS/HPV negative. Colposcopy ok.  Planning continued follow up with gyn.  Trying to stay active. No chest pain or sob reported. No abdominal pain reported. Taking a bowels supplement (belliwelli) - helping to keep bowels regular. Increased stress. Discussed.    Past Medical History:  Diagnosis Date   Arthritis    Herpes    H/O   History of abnormal Pap smear    Hypercholesterolemia    Past Surgical History:  Procedure Laterality Date   ANTERIOR CRUCIATE LIGAMENT REPAIR Left 2009   CERVICAL BIOPSY  W/ LOOP ELECTRODE EXCISION     COLONOSCOPY WITH PROPOFOL N/A 10/18/2016   Procedure: COLONOSCOPY WITH PROPOFOL;  Surgeon: [name], MD;  Location: [facility] ENDOSCOPY;  Service: Endoscopy;  Laterality: N/A;   COLONOSCOPY WITH PROPOFOL N/A 05/31/2021   Procedure: COLONOSCOPY WITH PROPOFOL;  Surgeon: Jaynie Collins, DO;  Location: [facility] ENDOSCOPY;  Service: Gastroenterology;  Laterality: N/A;   JOINT REPLACEMENT     RT Knee   NOVASURE ABLATION     TOTAL KNEE ARTHROPLASTY Right 05/13/2016   Procedure: RIGHT TOTAL KNEE ARTHROPLASTY;  Surgeon: Ollen Gross, MD;  Location: [facility] ORS;  Service: Orthopedics;  Laterality: Right;   TYMPANOPLASTY     Family History  Problem Relation Age of Onset   Arthritis Mother    Hyperlipidemia Mother    Hypertension Mother    Cancer Father        prostate   Hyperlipidemia Father    Hypertension Father    Alcohol abuse Maternal Grandfather    Breast cancer Neg Hx    Social History   Socioeconomic History   Marital status: Divorced    Spouse  name: Not on file   Number of children: Not on file   Years of education: Not on file   Highest education level: Not on file  Occupational History   Not on file  Tobacco Use   Smoking status: Every Day    Current packs/day: 0.25    Average packs/day: 0.3 packs/day for 3.0 years (0.8 ttl pk-yrs)    Types: Cigarettes   Smokeless tobacco: Never   Tobacco comments:    2 cigarettes a day  Vaping Use   Vaping status: Never Used  Substance and Sexual Activity   Alcohol use: Yes    Alcohol/week: 0.0 standard drinks of alcohol    Comment: socially   Drug use: No   Sexual activity: Not on file  Other Topics Concern   Not on file  Social History Narrative   Not on file   Social Drivers of Health   Financial Resource Strain: Not on file  Food Insecurity: Not on file  Transportation Needs: Not on file  Physical Activity: Not on file  Stress: Not on file  Social Connections: Not on file     Review of Systems  Constitutional:  Negative for appetite change and unexpected weight change.  HENT:  Negative for congestion and sinus pressure.   Respiratory:  Negative for cough, chest tightness and shortness of breath.   Cardiovascular:  Negative  for chest pain, palpitations and leg swelling.  Gastrointestinal:  Negative for abdominal pain, diarrhea, nausea and vomiting.  Genitourinary:  Negative for difficulty urinating and dysuria.  Musculoskeletal:  Negative for joint swelling and myalgias.  Skin:  Negative for color change and rash.  Neurological:  Negative for dizziness and headaches.  Psychiatric/Behavioral:  Negative for agitation and dysphoric mood.        Objective:     BP 112/68   Pulse 85   Temp 97.9 F (36.6 C)   Resp 16   Ht 5\' 6"  (1.676 m)   Wt 170 lb (77.1 kg)   SpO2 99%   BMI 27.44 kg/m  Wt Readings from Last 3 Encounters:  10/28/23 170 lb (77.1 kg)  06/27/23 172 lb 9.6 oz (78.3 kg)  12/26/22 171 lb (77.6 kg)    Physical Exam Vitals reviewed.   Constitutional:      General: She is not in acute distress.    Appearance: Normal appearance.  HENT:     Head: Normocephalic and atraumatic.     Right Ear: External ear normal.     Left Ear: External ear normal.     Mouth/Throat:     Pharynx: No oropharyngeal exudate or posterior oropharyngeal erythema.  Eyes:     General: No scleral icterus.       Right eye: No discharge.        Left eye: No discharge.     Conjunctiva/sclera: Conjunctivae normal.  Neck:     Thyroid: No thyromegaly.  Cardiovascular:     Rate and Rhythm: Normal rate and regular rhythm.  Pulmonary:     Effort: No respiratory distress.     Breath sounds: Normal breath sounds. No wheezing.  Abdominal:     General: Bowel sounds are normal.     Palpations: Abdomen is soft.     Tenderness: There is no abdominal tenderness.  Musculoskeletal:        General: No swelling or tenderness.     Cervical back: Neck supple. No tenderness.  Lymphadenopathy:     Cervical: No cervical adenopathy.  Skin:    Findings: No erythema or rash.  Neurological:     Mental Status: She is alert.  Psychiatric:        Mood and Affect: Mood normal.        Behavior: Behavior normal.         Outpatient Encounter Medications as of 10/28/2023  Medication Sig   citalopram (CELEXA) 20 MG tablet TAKE 1 TABLET(20 MG) BY MOUTH DAILY   Cyanocobalamin (VITAMIN B-12) 5000 MCG SUBL Place under the tongue daily at 6 (six) AM.   Misc Natural Products (OSTEO BI-FLEX ADV DOUBLE ST) TABS Take by mouth daily at 6 (six) AM.   valACYclovir (VALTREX) 500 MG tablet TAKE 1 TABLET(500 MG) BY MOUTH DAILY.   No facility-administered encounter medications on file as of 10/28/2023.     Lab Results  Component Value Date   WBC 8.2 06/25/2023   HGB 14.5 06/25/2023   HCT 43.1 06/25/2023   PLT 248.0 06/25/2023   GLUCOSE 92 10/27/2023   CHOL 196 10/27/2023   TRIG 60.0 10/27/2023   HDL 80.60 10/27/2023   LDLDIRECT 104.1 07/23/2013   LDLCALC 104 (H)  10/27/2023   ALT 15 10/27/2023   AST 15 10/27/2023   NA 140 10/27/2023   K 4.1 10/27/2023   CL 103 10/27/2023   CREATININE 0.51 10/27/2023   BUN 14 10/27/2023   CO2 28 10/27/2023  TSH 1.80 06/25/2023   INR 0.84 05/03/2016    MM 3D SCREENING MAMMOGRAM BILATERAL BREAST Result Date: 01/24/2023 CLINICAL DATA:  Screening. EXAM: DIGITAL SCREENING BILATERAL MAMMOGRAM WITH TOMOSYNTHESIS AND CAD TECHNIQUE: Bilateral screening digital craniocaudal and mediolateral oblique mammograms were obtained. Bilateral screening digital breast tomosynthesis was performed. The images were evaluated with computer-aided detection. COMPARISON:  Previous exam(s). ACR Breast Density Category b: There are scattered areas of fibroglandular density. FINDINGS: There are no findings suspicious for malignancy. IMPRESSION: No mammographic evidence of malignancy. A result letter of this screening mammogram will be mailed directly to the patient. RECOMMENDATION: Screening mammogram in one year. (Code:SM-B-01Y) BI-RADS CATEGORY  1: Negative. Electronically Signed   By: Emmaline Kluver M.D.   On: 01/24/2023 14:59       Assessment & Plan:  Abnormal cervical Papanicolaou smear, unspecified abnormal pap finding Assessment & Plan: Saw gyn 04/07/23 - pap ASCUS/HPV negative. Colposcopy ok.  Planning continued follow up with gyn.    Hypercholesterolemia Assessment & Plan: The 10-year ASCVD risk score (Arnett DK, et al., 2019) is: 3.4%   Values used to calculate the score:     Age: 29 years     Sex: Female     Is Non-Hispanic African American: No     Diabetic: No     Tobacco smoker: Yes     Systolic Blood Pressure: 112 mmHg     Is BP treated: No     HDL Cholesterol: 80.6 mg/dL     Total Cholesterol: 196 mg/dL  Low cholesterol diet and exercise.  Follow lipid panel.     Orders: -     Hepatic function panel; Future -     Basic metabolic panel; Future -     Lipid panel; Future -     CBC with Differential/Platelet;  Future -     TSH; Future  History of colon polyps Assessment & Plan: Colonoscopy 05/2021. Had recommended f/u in one year. Have discussed. See previous note.    Stress Assessment & Plan: Increased stress. Continues on citalopram. Does not feel needs any further intervention. Follow.    Tobacco abuse Assessment & Plan: Have discussed smoking cessation.       Dale Cokato, MD

## 2023-10-28 NOTE — Assessment & Plan Note (Signed)
 The 10-year ASCVD risk score (Arnett DK, et al., 2019) is: 3.4%   Values used to calculate the score:     Age: 58 years     Sex: Female     Is Non-Hispanic African American: No     Diabetic: No     Tobacco smoker: Yes     Systolic Blood Pressure: 112 mmHg     Is BP treated: No     HDL Cholesterol: 80.6 mg/dL     Total Cholesterol: 196 mg/dL  Low cholesterol diet and exercise.  Follow lipid panel.

## 2023-11-02 ENCOUNTER — Encounter: Payer: Self-pay | Admitting: Internal Medicine

## 2023-11-02 NOTE — Assessment & Plan Note (Signed)
Have discussed smoking cessation.

## 2023-11-02 NOTE — Assessment & Plan Note (Signed)
 Colonoscopy 05/2021. Had recommended f/u in one year. Have discussed. See previous note.

## 2023-11-02 NOTE — Assessment & Plan Note (Signed)
 Saw gyn 04/07/23 - pap ASCUS/HPV negative. Colposcopy ok.  Planning continued follow up with gyn.

## 2023-11-02 NOTE — Assessment & Plan Note (Signed)
 Increased stress. Continues on citalopram. Does not feel needs any further intervention. Follow.

## 2024-02-09 ENCOUNTER — Other Ambulatory Visit: Payer: Self-pay | Admitting: Internal Medicine

## 2024-02-24 ENCOUNTER — Other Ambulatory Visit (INDEPENDENT_AMBULATORY_CARE_PROVIDER_SITE_OTHER): Payer: 59

## 2024-02-24 DIAGNOSIS — E7800 Pure hypercholesterolemia, unspecified: Secondary | ICD-10-CM | POA: Diagnosis not present

## 2024-02-24 LAB — CBC WITH DIFFERENTIAL/PLATELET
Basophils Absolute: 0.1 10*3/uL (ref 0.0–0.1)
Basophils Relative: 1.1 % (ref 0.0–3.0)
Eosinophils Absolute: 0.3 10*3/uL (ref 0.0–0.7)
Eosinophils Relative: 4.7 % (ref 0.0–5.0)
HCT: 40.7 % (ref 36.0–46.0)
Hemoglobin: 14 g/dL (ref 12.0–15.0)
Lymphocytes Relative: 35 % (ref 12.0–46.0)
Lymphs Abs: 2.5 10*3/uL (ref 0.7–4.0)
MCHC: 34.4 g/dL (ref 30.0–36.0)
MCV: 94.5 fl (ref 78.0–100.0)
Monocytes Absolute: 0.5 10*3/uL (ref 0.1–1.0)
Monocytes Relative: 6.8 % (ref 3.0–12.0)
Neutro Abs: 3.7 10*3/uL (ref 1.4–7.7)
Neutrophils Relative %: 52.4 % (ref 43.0–77.0)
Platelets: 235 10*3/uL (ref 150.0–400.0)
RBC: 4.31 Mil/uL (ref 3.87–5.11)
RDW: 13.9 % (ref 11.5–15.5)
WBC: 7 10*3/uL (ref 4.0–10.5)

## 2024-02-24 LAB — TSH: TSH: 1.72 u[IU]/mL (ref 0.35–5.50)

## 2024-02-24 LAB — HEPATIC FUNCTION PANEL
ALT: 20 U/L (ref 0–35)
AST: 20 U/L (ref 0–37)
Albumin: 4.1 g/dL (ref 3.5–5.2)
Alkaline Phosphatase: 52 U/L (ref 39–117)
Bilirubin, Direct: 0.1 mg/dL (ref 0.0–0.3)
Total Bilirubin: 0.6 mg/dL (ref 0.2–1.2)
Total Protein: 6.6 g/dL (ref 6.0–8.3)

## 2024-02-24 LAB — BASIC METABOLIC PANEL WITH GFR
BUN: 15 mg/dL (ref 6–23)
CO2: 31 meq/L (ref 19–32)
Calcium: 9.2 mg/dL (ref 8.4–10.5)
Chloride: 104 meq/L (ref 96–112)
Creatinine, Ser: 0.53 mg/dL (ref 0.40–1.20)
GFR: 101.32 mL/min (ref >60.00)
Glucose, Bld: 98 mg/dL (ref 70–99)
Potassium: 3.9 meq/L (ref 3.5–5.1)
Sodium: 142 meq/L (ref 135–145)

## 2024-02-24 LAB — LIPID PANEL
Cholesterol: 207 mg/dL — ABNORMAL HIGH (ref 0–200)
HDL: 79.6 mg/dL (ref >39.00)
LDL Cholesterol: 106 mg/dL — ABNORMAL HIGH (ref 0–99)
NonHDL: 127.89
Total CHOL/HDL Ratio: 3
Triglycerides: 108 mg/dL (ref 0.0–149.0)
VLDL: 21.6 mg/dL (ref 0.0–40.0)

## 2024-02-25 ENCOUNTER — Ambulatory Visit: Payer: Self-pay | Admitting: Internal Medicine

## 2024-02-26 ENCOUNTER — Encounter: Payer: 59 | Admitting: Internal Medicine

## 2024-03-03 ENCOUNTER — Other Ambulatory Visit (HOSPITAL_COMMUNITY)
Admission: RE | Admit: 2024-03-03 | Discharge: 2024-03-03 | Disposition: A | Discharge status: Home / Self Care | Source: Ambulatory Visit | Attending: Internal Medicine | Admitting: Internal Medicine

## 2024-03-03 ENCOUNTER — Ambulatory Visit: Admitting: Internal Medicine

## 2024-03-03 VITALS — BP 118/70 | HR 70 | Temp 98.2°F | Resp 16 | Ht 66.0 in | Wt 172.4 lb

## 2024-03-03 DIAGNOSIS — Z124 Encounter for screening for malignant neoplasm of cervix: Secondary | ICD-10-CM | POA: Diagnosis not present

## 2024-03-03 DIAGNOSIS — E7800 Pure hypercholesterolemia, unspecified: Secondary | ICD-10-CM | POA: Diagnosis not present

## 2024-03-03 DIAGNOSIS — Z1283 Encounter for screening for malignant neoplasm of skin: Secondary | ICD-10-CM

## 2024-03-03 DIAGNOSIS — F439 Reaction to severe stress, unspecified: Secondary | ICD-10-CM

## 2024-03-03 DIAGNOSIS — Z1231 Encounter for screening mammogram for malignant neoplasm of breast: Secondary | ICD-10-CM

## 2024-03-03 DIAGNOSIS — Z0000 Encounter for general adult medical examination without abnormal findings: Secondary | ICD-10-CM

## 2024-03-03 DIAGNOSIS — R87619 Unspecified abnormal cytological findings in specimens from cervix uteri: Secondary | ICD-10-CM

## 2024-03-03 MED ORDER — VALACYCLOVIR HCL 500 MG PO TABS: ORAL_TABLET | ORAL | 1 refills | Status: DC

## 2024-03-03 MED ORDER — CITALOPRAM HYDROBROMIDE 20 MG PO TABS: ORAL_TABLET | ORAL | 1 refills | Status: DC

## 2024-03-03 NOTE — Progress Notes (Signed)
 Subjective:    Patient ID: Maria Munoz, female    DOB: May 16, 1966, 58 y.o.   MRN: [id number]  Patient here for  Chief Complaint  Patient presents with   Annual Exam    HPI Here for a physical exam. Saw cardiology 12/06/22 - discussed calcium score, echo and zio patch. Elected to monitor. Saw gyn 04/07/23 - pap ASCUS/HPV negative. Colposcopy ok. Repeat pap today. Continues on citalopram . Overall doing relatively well. Handling stress. No chest pain or sob reported. No abdominal pain or bowel change reported. Discussed calculated cholesterol risk. Discussed cholesterol medication. Discussed colonoscopy - overdue.    Past Medical History:  Diagnosis Date   Arthritis    Herpes    H/O   History of abnormal Pap smear    Hypercholesterolemia    Past Surgical History:  Procedure Laterality Date   ANTERIOR CRUCIATE LIGAMENT REPAIR Left 2009   CERVICAL BIOPSY  W/ LOOP ELECTRODE EXCISION     COLONOSCOPY WITH PROPOFOL  N/A 10/18/2016   Procedure: COLONOSCOPY WITH PROPOFOL ;  Surgeon: Gladis RAYMOND Mariner, MD;  Location: [facility] ENDOSCOPY;  Service: Endoscopy;  Laterality: N/A;   COLONOSCOPY WITH PROPOFOL  N/A 05/31/2021   Procedure: COLONOSCOPY WITH PROPOFOL ;  Surgeon: Onita Elspeth Sharper, DO;  Location: [facility] ENDOSCOPY;  Service: Gastroenterology;  Laterality: N/A;   JOINT REPLACEMENT     RT Knee   NOVASURE ABLATION     TOTAL KNEE ARTHROPLASTY Right 05/13/2016   Procedure: RIGHT TOTAL KNEE ARTHROPLASTY;  Surgeon: Dempsey Moan, MD;  Location: [facility];  Service: Orthopedics;  Laterality: Right;   TYMPANOPLASTY     Family History  Problem Relation Age of Onset   Arthritis Mother    Hyperlipidemia Mother    Hypertension Mother    Cancer Father        prostate   Hyperlipidemia Father    Hypertension Father    Alcohol abuse Maternal Grandfather    Breast cancer Neg Hx    Social History   Socioeconomic History   Marital status: Divorced    Spouse name: Not on file   Number of children: Not on  file   Years of education: Not on file   Highest education level: Master's degree (e.g., MA, MS, MEng, MEd, MSW, MBA)  Occupational History   Not on file  Tobacco Use   Smoking status: Every Day    Current packs/day: 0.25    Average packs/day: 0.3 packs/day for 3.0 years (0.8 ttl pk-yrs)    Types: Cigarettes   Smokeless tobacco: Never   Tobacco comments:    2 cigarettes a day  Vaping Use   Vaping status: Never Used  Substance and Sexual Activity   Alcohol use: Yes    Alcohol/week: 0.0 standard drinks of alcohol    Comment: socially   Drug use: No   Sexual activity: Not on file  Other Topics Concern   Not on file  Social History Narrative   Not on file   Social Drivers of Health   Financial Resource Strain: Low Risk  (03/02/2024)   Overall Financial Resource Strain (CARDIA)    Difficulty of Paying Living Expenses: Not hard at all  Food Insecurity: No Food Insecurity (03/02/2024)   Hunger Vital Sign    Worried About Running Out of Food in the Last Year: Never true    Ran Out of Food in the Last Year: Never true  Transportation Needs: No Transportation Needs (03/02/2024)   PRAPARE - Administrator, Civil Service (Medical): No  Lack of Transportation (Non-Medical): No  Physical Activity: Insufficiently Active (03/02/2024)   Exercise Vital Sign    Days of Exercise per Week: 3 days    Minutes of Exercise per Session: 20 min  Stress: No Stress Concern Present (03/02/2024)   [employer] of Occupational Health - Occupational Stress Questionnaire    Feeling of Stress: Only a little  Social Connections: Moderately Integrated (03/02/2024)   Social Connection and Isolation Panel    Frequency of Communication with Friends and Family: More than three times a week    Frequency of Social Gatherings with Friends and Family: Three times a week    Attends Religious Services: 1 to 4 times per year    Active Member of Clubs or Organizations: No    Attends Hospital doctor: Not on file    Marital Status: Living with partner     Review of Systems  Constitutional:  Negative for appetite change and unexpected weight change.  HENT:  Negative for congestion, sinus pressure and sore throat.   Eyes:  Negative for pain and visual disturbance.  Respiratory:  Negative for cough, chest tightness and shortness of breath.   Cardiovascular:  Negative for chest pain, palpitations and leg swelling.  Gastrointestinal:  Negative for abdominal pain, diarrhea, nausea and vomiting.  Genitourinary:  Negative for difficulty urinating and dysuria.  Musculoskeletal:  Negative for joint swelling and myalgias.  Skin:  Negative for color change and rash.  Neurological:  Negative for dizziness and headaches.  Hematological:  Negative for adenopathy. Does not bruise/bleed easily.  Psychiatric/Behavioral:  Negative for agitation and dysphoric mood.        Objective:     BP 118/70   Pulse 70   Temp 98.2 F (36.8 C)   Resp 16   Ht 5' 6 (1.676 m)   Wt 172 lb 6.4 oz (78.2 kg)   SpO2 99%   BMI 27.83 kg/m  Wt Readings from Last 3 Encounters:  03/03/24 172 lb 6.4 oz (78.2 kg)  10/28/23 170 lb (77.1 kg)  06/27/23 172 lb 9.6 oz (78.3 kg)    Physical Exam Vitals reviewed.  Constitutional:      General: She is not in acute distress.    Appearance: Normal appearance. She is well-developed.  HENT:     Head: Normocephalic and atraumatic.     Right Ear: External ear normal.     Left Ear: External ear normal.     Mouth/Throat:     Pharynx: No oropharyngeal exudate or posterior oropharyngeal erythema.  Eyes:     General: No scleral icterus.       Right eye: No discharge.        Left eye: No discharge.     Conjunctiva/sclera: Conjunctivae normal.  Neck:     Thyroid : No thyromegaly.  Cardiovascular:     Rate and Rhythm: Normal rate and regular rhythm.  Pulmonary:     Effort: No tachypnea, accessory muscle usage or respiratory distress.     Breath sounds: Normal  breath sounds. No decreased breath sounds or wheezing.  Chest:  Breasts:    Right: No inverted nipple, mass, nipple discharge or tenderness (no axillary adenopathy).     Left: No inverted nipple, mass, nipple discharge or tenderness (no axilarry adenopathy).  Abdominal:     General: Bowel sounds are normal.     Palpations: Abdomen is soft.     Tenderness: There is no abdominal tenderness.  Genitourinary:    Comments: Normal external genitalia.  Vaginal vault without lesions.  Cervix identified.  Pap smear performed.  Could not appreciate any adnexal masses or tenderness.   Musculoskeletal:        General: No swelling or tenderness.     Cervical back: Neck supple.  Lymphadenopathy:     Cervical: No cervical adenopathy.  Skin:    Findings: No erythema or rash.  Neurological:     Mental Status: She is alert and oriented to person, place, and time.  Psychiatric:        Mood and Affect: Mood normal.        Behavior: Behavior normal.         Outpatient Encounter Medications as of 03/03/2024  Medication Sig   citalopram  (CELEXA ) 20 MG tablet TAKE 1 TABLET(20 MG) BY MOUTH DAILY   Cyanocobalamin (VITAMIN B-12) 5000 MCG SUBL Place under the tongue daily at 6 (six) AM.   Misc Natural Products (OSTEO BI-FLEX ADV DOUBLE ST) TABS Take by mouth daily at 6 (six) AM.   valACYclovir  (VALTREX ) 500 MG tablet TAKE 1 TABLET(500 MG) BY MOUTH DAILY   [DISCONTINUED] citalopram  (CELEXA ) 20 MG tablet TAKE 1 TABLET(20 MG) BY MOUTH DAILY   [DISCONTINUED] valACYclovir  (VALTREX ) 500 MG tablet TAKE 1 TABLET(500 MG) BY MOUTH DAILY.   No facility-administered encounter medications on file as of 03/03/2024.     Lab Results  Component Value Date   WBC 7.0 02/24/2024   HGB 14.0 02/24/2024   HCT 40.7 02/24/2024   PLT 235.0 02/24/2024   GLUCOSE 98 02/24/2024   CHOL 207 (H) 02/24/2024   TRIG 108.0 02/24/2024   HDL 79.60 02/24/2024   LDLDIRECT 104.1 07/23/2013   LDLCALC 106 (H) 02/24/2024   ALT 20 02/24/2024    AST 20 02/24/2024   NA 142 02/24/2024   K 3.9 02/24/2024   CL 104 02/24/2024   CREATININE 0.53 02/24/2024   BUN 15 02/24/2024   CO2 31 02/24/2024   TSH 1.72 02/24/2024   INR 0.84 05/03/2016    MM 3D SCREENING MAMMOGRAM BILATERAL BREAST Result Date: 01/24/2023 CLINICAL DATA:  Screening. EXAM: DIGITAL SCREENING BILATERAL MAMMOGRAM WITH TOMOSYNTHESIS AND CAD TECHNIQUE: Bilateral screening digital craniocaudal and mediolateral oblique mammograms were obtained. Bilateral screening digital breast tomosynthesis was performed. The images were evaluated with computer-aided detection. COMPARISON:  Previous exam(s). ACR Breast Density Category b: There are scattered areas of fibroglandular density. FINDINGS: There are no findings suspicious for malignancy. IMPRESSION: No mammographic evidence of malignancy. A result letter of this screening mammogram will be mailed directly to the patient. RECOMMENDATION: Screening mammogram in one year. (Code:SM-B-01Y) BI-RADS CATEGORY  1: Negative. Electronically Signed   By: Inocente Ast M.D.   On: 01/24/2023 14:59       Assessment & Plan:  Routine general medical examination at a health care facility  Health care maintenance Assessment & Plan: Physical today 03/03/24.  Colposcopy 09/12/21 - negative. Saw gyn 04/07/23 - pap ASCUS/HPV negative. Colposcopy ok.  Colonoscopy 05/2021 - recommended f/u in one year.  Discussed. Declines to schedule at this time.  Will notify when agreeable.  Mammogram 01/23/23 - Birads I. Schedule f/u mammogram.    Hypercholesterolemia Assessment & Plan: The 10-year ASCVD risk score (Arnett DK, et al., 2019) is: 3.9%   Values used to calculate the score:     Age: 21 years     Clincally relevant sex: Female     Is Non-Hispanic African American: No     Diabetic: No     Tobacco smoker: Yes  Systolic Blood Pressure: 112 mmHg     Is BP treated: No     HDL Cholesterol: 79.6 mg/dL     Total Cholesterol: 207 mg/dL  Low cholesterol  diet and exercise.  Follow lipid panel.    Visit for screening mammogram -     3D Screening Mammogram, Left and Right; Future  Screening for cervical cancer -     Cytology - PAP  Abnormal cervical Papanicolaou smear, unspecified abnormal pap finding Assessment & Plan: Saw gyn 04/07/23 - pap ASCUS/HPV negative. Colposcopy ok.  Repeat pap today.    Stress Assessment & Plan: Continues on citalopram . Does not feel needs any further intervention. Follow.    Skin cancer screening Assessment & Plan: Request referral to dermatology in [place].   Orders: -     Ambulatory referral to Dermatology  Other orders -     Citalopram  Hydrobromide; TAKE 1 TABLET(20 MG) BY MOUTH DAILY  Dispense: 90 tablet; Refill: 1 -     valACYclovir  HCl; TAKE 1 TABLET(500 MG) BY MOUTH DAILY  Dispense: 90 tablet; Refill: 1     Allena Hamilton, MD

## 2024-03-03 NOTE — Assessment & Plan Note (Signed)
 The 10-year ASCVD risk score (Arnett DK, et al., 2019) is: 3.9%   Values used to calculate the score:     Age: 58 years     Clincally relevant sex: Female     Is Non-Hispanic African American: No     Diabetic: No     Tobacco smoker: Yes     Systolic Blood Pressure: 112 mmHg     Is BP treated: No     HDL Cholesterol: 79.6 mg/dL     Total Cholesterol: 207 mg/dL  Low cholesterol diet and exercise.  Follow lipid panel.

## 2024-03-03 NOTE — Assessment & Plan Note (Signed)
 Physical today 03/03/24.  Colposcopy 09/12/21 - negative. Saw gyn 04/07/23 - pap ASCUS/HPV negative. Colposcopy ok.  Colonoscopy 05/2021 - recommended f/u in one year.  Discussed. Declines to schedule at this time.  Will notify when agreeable.  Mammogram 01/23/23 - Birads I. Schedule f/u mammogram.

## 2024-03-06 ENCOUNTER — Encounter: Payer: Self-pay | Admitting: Internal Medicine

## 2024-03-06 DIAGNOSIS — Z1283 Encounter for screening for malignant neoplasm of skin: Secondary | ICD-10-CM | POA: Insufficient documentation

## 2024-03-06 NOTE — Assessment & Plan Note (Signed)
 Saw gyn 04/07/23 - pap ASCUS/HPV negative. Colposcopy ok.  Repeat pap today.

## 2024-03-06 NOTE — Assessment & Plan Note (Signed)
Continues on citalopram.  Does not feel needs any further intervention. Follow.

## 2024-03-06 NOTE — Assessment & Plan Note (Signed)
 Request referral to dermatology in [place].

## 2024-03-09 LAB — CYTOLOGY - PAP
Comment: NEGATIVE
Diagnosis: NEGATIVE
Diagnosis: REACTIVE
High risk HPV: NEGATIVE

## 2024-03-10 ENCOUNTER — Ambulatory Visit: Payer: Self-pay | Admitting: Internal Medicine

## 2024-04-13 ENCOUNTER — Encounter: Payer: Self-pay | Admitting: Internal Medicine

## 2024-04-13 ENCOUNTER — Ambulatory Visit
Admission: RE | Admit: 2024-04-13 | Discharge: 2024-04-13 | Disposition: A | Discharge status: Home / Self Care | Source: Ambulatory Visit | Attending: Internal Medicine | Admitting: Internal Medicine

## 2024-04-13 DIAGNOSIS — Z1231 Encounter for screening mammogram for malignant neoplasm of breast: Secondary | ICD-10-CM | POA: Insufficient documentation

## 2024-04-18 ENCOUNTER — Other Ambulatory Visit: Payer: Self-pay | Admitting: Internal Medicine

## 2024-07-06 ENCOUNTER — Encounter: Payer: Self-pay | Admitting: Internal Medicine

## 2024-07-06 DIAGNOSIS — E7800 Pure hypercholesterolemia, unspecified: Secondary | ICD-10-CM

## 2024-07-06 NOTE — Telephone Encounter (Signed)
 Yes.already spoke with pt

## 2024-07-06 NOTE — Telephone Encounter (Signed)
 Orders signed. Is pt aware of appts?

## 2024-07-06 NOTE — Telephone Encounter (Signed)
 Labs pended appts made

## 2024-07-15 ENCOUNTER — Other Ambulatory Visit (INDEPENDENT_AMBULATORY_CARE_PROVIDER_SITE_OTHER)

## 2024-07-15 DIAGNOSIS — E7800 Pure hypercholesterolemia, unspecified: Secondary | ICD-10-CM | POA: Diagnosis not present

## 2024-07-15 LAB — BASIC METABOLIC PANEL WITH GFR
BUN: 20 mg/dL (ref 6–23)
CO2: 30 meq/L (ref 19–32)
Calcium: 9.6 mg/dL (ref 8.4–10.5)
Chloride: 103 meq/L (ref 96–112)
Creatinine, Ser: 0.54 mg/dL (ref 0.40–1.20)
GFR: 100.59 mL/min (ref >60.00)
Glucose, Bld: 86 mg/dL (ref 70–99)
Potassium: 4 meq/L (ref 3.5–5.1)
Sodium: 142 meq/L (ref 135–145)

## 2024-07-15 LAB — LIPID PANEL
Cholesterol: 224 mg/dL — ABNORMAL HIGH (ref 0–200)
HDL: 80.6 mg/dL (ref >39.00)
LDL Cholesterol: 123 mg/dL — ABNORMAL HIGH (ref 0–99)
NonHDL: 143.14
Total CHOL/HDL Ratio: 3
Triglycerides: 101 mg/dL (ref 0.0–149.0)
VLDL: 20.2 mg/dL (ref 0.0–40.0)

## 2024-07-15 LAB — HEPATIC FUNCTION PANEL
ALT: 17 U/L (ref 0–35)
AST: 16 U/L (ref 0–37)
Albumin: 4.5 g/dL (ref 3.5–5.2)
Alkaline Phosphatase: 53 U/L (ref 39–117)
Bilirubin, Direct: 0.1 mg/dL (ref 0.0–0.3)
Total Bilirubin: 0.6 mg/dL (ref 0.2–1.2)
Total Protein: 6.7 g/dL (ref 6.0–8.3)

## 2024-07-16 ENCOUNTER — Ambulatory Visit: Payer: Self-pay | Admitting: Internal Medicine

## 2024-07-19 ENCOUNTER — Ambulatory Visit: Admitting: Internal Medicine

## 2024-07-19 DIAGNOSIS — E7800 Pure hypercholesterolemia, unspecified: Secondary | ICD-10-CM

## 2024-07-20 ENCOUNTER — Ambulatory Visit: Admitting: Internal Medicine

## 2024-07-20 ENCOUNTER — Encounter: Payer: Self-pay | Admitting: Internal Medicine

## 2024-07-20 VITALS — BP 120/80 | HR 65 | Temp 98.1°F | Ht 66.0 in | Wt 176.0 lb

## 2024-07-20 DIAGNOSIS — F439 Reaction to severe stress, unspecified: Secondary | ICD-10-CM | POA: Diagnosis not present

## 2024-07-20 DIAGNOSIS — Z720 Tobacco use: Secondary | ICD-10-CM | POA: Diagnosis not present

## 2024-07-20 DIAGNOSIS — E7800 Pure hypercholesterolemia, unspecified: Secondary | ICD-10-CM | POA: Diagnosis not present

## 2024-07-20 DIAGNOSIS — R87619 Unspecified abnormal cytological findings in specimens from cervix uteri: Secondary | ICD-10-CM | POA: Diagnosis not present

## 2024-07-20 MED ORDER — VALACYCLOVIR HCL 500 MG PO TABS: ORAL_TABLET | ORAL | 1 refills | Status: DC

## 2024-07-20 MED ORDER — CITALOPRAM HYDROBROMIDE 20 MG PO TABS: ORAL_TABLET | ORAL | 1 refills | Status: DC

## 2024-07-20 NOTE — Assessment & Plan Note (Addendum)
 The 10-year ASCVD risk score (Arnett DK, et al., 2019) is: 4.7%   Values used to calculate the score:     Age: 58 years     Clincally relevant sex: Female     Is Non-Hispanic African American: No     Diabetic: No     Tobacco smoker: Yes     Systolic Blood Pressure: 120 mmHg     Is BP treated: No     HDL Cholesterol: 80.6 mg/dL     Total Cholesterol: 224 mg/dL  Low cholesterol diet and exercise.  Follow lipid panel.

## 2024-07-20 NOTE — Progress Notes (Signed)
 Subjective:    Patient ID: Maria Munoz, female    DOB: 09-09-1965, 58 y.o.   MRN: [id number]  Patient here for  Chief Complaint  Patient presents with   Medical Management of Chronic Issues    4 mth f/u    HPI Here for a scheduled follow up - follow up regarding increased stress and hypercholesterolemia. Saw cardiology 12/06/22 - discussed calcium score, echo and zio patch. Elected to monitor. Saw gyn 04/07/23 - pap ASCUS/HPV negative. Colposcopy ok. Repeat pap 03/03/24 - ok. Continues on citalopram . Overall doing relatively well. Tries to stay active. No chest pain or sob reported. No abdominal pain or bowel change reported.    Past Medical History:  Diagnosis Date   Arthritis    Herpes    H/O   History of abnormal Pap smear    Hypercholesterolemia    Past Surgical History:  Procedure Laterality Date   ANTERIOR CRUCIATE LIGAMENT REPAIR Left 2009   CERVICAL BIOPSY  W/ LOOP ELECTRODE EXCISION     COLONOSCOPY WITH PROPOFOL  N/A 10/18/2016   Procedure: COLONOSCOPY WITH PROPOFOL ;  Surgeon: Gladis RAYMOND Mariner, MD;  Location: [facility] ENDOSCOPY;  Service: Endoscopy;  Laterality: N/A;   COLONOSCOPY WITH PROPOFOL  N/A 05/31/2021   Procedure: COLONOSCOPY WITH PROPOFOL ;  Surgeon: Onita Elspeth Sharper, DO;  Location: [facility] ENDOSCOPY;  Service: Gastroenterology;  Laterality: N/A;   JOINT REPLACEMENT     RT Knee   NOVASURE ABLATION     TOTAL KNEE ARTHROPLASTY Right 05/13/2016   Procedure: RIGHT TOTAL KNEE ARTHROPLASTY;  Surgeon: Dempsey Moan, MD;  Location: [facility];  Service: Orthopedics;  Laterality: Right;   TYMPANOPLASTY     Family History  Problem Relation Age of Onset   Arthritis Mother    Hyperlipidemia Mother    Hypertension Mother    Cancer Father        prostate   Hyperlipidemia Father    Hypertension Father    Alcohol abuse Maternal Grandfather    Breast cancer Neg Hx    Social History   Socioeconomic History   Marital status: Divorced    Spouse name: Not on file   Number of  children: Not on file   Years of education: Not on file   Highest education level: Master's degree (e.g., MA, MS, MEng, MEd, MSW, MBA)  Occupational History   Not on file  Tobacco Use   Smoking status: Every Day    Current packs/day: 0.25    Average packs/day: 0.3 packs/day for 3.0 years (0.8 ttl pk-yrs)    Types: Cigarettes   Smokeless tobacco: Never   Tobacco comments:    2 cigarettes a day  Vaping Use   Vaping status: Never Used  Substance and Sexual Activity   Alcohol use: Yes    Alcohol/week: 0.0 standard drinks of alcohol    Comment: socially   Drug use: No   Sexual activity: Not on file  Other Topics Concern   Not on file  Social History Narrative   Not on file   Social Drivers of Health   Financial Resource Strain: Low Risk  (07/19/2024)   Overall Financial Resource Strain (CARDIA)    Difficulty of Paying Living Expenses: Not very hard  Food Insecurity: No Food Insecurity (07/19/2024)   Hunger Vital Sign    Worried About Running Out of Food in the Last Year: Never true    Ran Out of Food in the Last Year: Never true  Transportation Needs: No Transportation Needs (07/19/2024)   PRAPARE -  Administrator, Civil Service (Medical): No    Lack of Transportation (Non-Medical): No  Physical Activity: Insufficiently Active (07/19/2024)   Exercise Vital Sign    Days of Exercise per Week: 2 days    Minutes of Exercise per Session: 30 min  Stress: No Stress Concern Present (07/19/2024)   [employer] of Occupational Health - Occupational Stress Questionnaire    Feeling of Stress: Only a little  Social Connections: Moderately Isolated (07/19/2024)   Social Connection and Isolation Panel    Frequency of Communication with Friends and Family: More than three times a week    Frequency of Social Gatherings with Friends and Family: Three times a week    Attends Religious Services: 1 to 4 times per year    Active Member of Clubs or Organizations: No    Attends  Engineer, Structural: Not on file    Marital Status: Divorced     Review of Systems  Constitutional:  Negative for appetite change and unexpected weight change.  HENT:  Negative for congestion and sinus pressure.   Respiratory:  Negative for cough, chest tightness and shortness of breath.   Cardiovascular:  Negative for chest pain, palpitations and leg swelling.  Gastrointestinal:  Negative for abdominal pain, diarrhea, nausea and vomiting.  Genitourinary:  Negative for difficulty urinating and dysuria.  Musculoskeletal:  Negative for joint swelling and myalgias.  Skin:  Negative for color change and rash.  Neurological:  Negative for dizziness and headaches.  Psychiatric/Behavioral:  Negative for agitation and dysphoric mood.        Objective:     BP 120/80   Pulse 65   Temp 98.1 F (36.7 C) (Oral)   Ht 5' 6 (1.676 m)   Wt 176 lb (79.8 kg)   SpO2 95%   BMI 28.41 kg/m  Wt Readings from Last 3 Encounters:  07/20/24 176 lb (79.8 kg)  03/03/24 172 lb 6.4 oz (78.2 kg)  10/28/23 170 lb (77.1 kg)    Physical Exam Vitals reviewed.  Constitutional:      General: She is not in acute distress.    Appearance: Normal appearance.  HENT:     Head: Normocephalic and atraumatic.     Right Ear: External ear normal.     Left Ear: External ear normal.     Mouth/Throat:     Pharynx: No oropharyngeal exudate or posterior oropharyngeal erythema.  Eyes:     General: No scleral icterus.       Right eye: No discharge.        Left eye: No discharge.     Conjunctiva/sclera: Conjunctivae normal.  Neck:     Thyroid : No thyromegaly.  Cardiovascular:     Rate and Rhythm: Normal rate and regular rhythm.  Pulmonary:     Effort: No respiratory distress.     Breath sounds: Normal breath sounds. No wheezing.  Abdominal:     General: Bowel sounds are normal.     Palpations: Abdomen is soft.     Tenderness: There is no abdominal tenderness.  Musculoskeletal:        General: No  swelling or tenderness.     Cervical back: Neck supple. No tenderness.  Lymphadenopathy:     Cervical: No cervical adenopathy.  Skin:    Findings: No erythema or rash.  Neurological:     Mental Status: She is alert.  Psychiatric:        Mood and Affect: Mood normal.  Behavior: Behavior normal.         Outpatient Encounter Medications as of 07/20/2024  Medication Sig   Cyanocobalamin (VITAMIN B-12) 5000 MCG SUBL Place under the tongue daily at 6 (six) AM.   Misc Natural Products (OSTEO BI-FLEX ADV DOUBLE ST) TABS Take by mouth daily at 6 (six) AM.   citalopram  (CELEXA ) 20 MG tablet TAKE 1 TABLET(20 MG) BY MOUTH DAILY   valACYclovir  (VALTREX ) 500 MG tablet TAKE 1 TABLET(500 MG) BY MOUTH DAILY   [DISCONTINUED] citalopram  (CELEXA ) 20 MG tablet TAKE 1 TABLET(20 MG) BY MOUTH DAILY   [DISCONTINUED] valACYclovir  (VALTREX ) 500 MG tablet TAKE 1 TABLET(500 MG) BY MOUTH DAILY   No facility-administered encounter medications on file as of 07/20/2024.     Lab Results  Component Value Date   WBC 7.0 02/24/2024   HGB 14.0 02/24/2024   HCT 40.7 02/24/2024   PLT 235.0 02/24/2024   GLUCOSE 86 07/15/2024   CHOL 224 (H) 07/15/2024   TRIG 101.0 07/15/2024   HDL 80.60 07/15/2024   LDLDIRECT 104.1 07/23/2013   LDLCALC 123 (H) 07/15/2024   ALT 17 07/15/2024   AST 16 07/15/2024   NA 142 07/15/2024   K 4.0 07/15/2024   CL 103 07/15/2024   CREATININE 0.54 07/15/2024   BUN 20 07/15/2024   CO2 30 07/15/2024   TSH 1.72 02/24/2024   INR 0.84 05/03/2016    MM 3D SCREENING MAMMOGRAM BILATERAL BREAST Result Date: 04/15/2024 CLINICAL DATA:  Screening. EXAM: DIGITAL SCREENING BILATERAL MAMMOGRAM WITH TOMOSYNTHESIS AND CAD TECHNIQUE: Bilateral screening digital craniocaudal and mediolateral oblique mammograms were obtained. Bilateral screening digital breast tomosynthesis was performed. The images were evaluated with computer-aided detection. COMPARISON:  Previous exam(s). ACR Breast Density  Category b: There are scattered areas of fibroglandular density. FINDINGS: There are no findings suspicious for malignancy. IMPRESSION: No mammographic evidence of malignancy. A result letter of this screening mammogram will be mailed directly to the patient. RECOMMENDATION: Screening mammogram in one year. (Code:SM-B-01Y) BI-RADS CATEGORY  1: Negative. Electronically Signed   By: Inocente Ast M.D.   On: 04/15/2024 08:31       Assessment & Plan:  Tobacco abuse Assessment & Plan: Have discussed smoking cessation.    Hypercholesterolemia Assessment & Plan: The 10-year ASCVD risk score (Arnett DK, et al., 2019) is: 4.7%   Values used to calculate the score:     Age: 43 years     Clincally relevant sex: Female     Is Non-Hispanic African American: No     Diabetic: No     Tobacco smoker: Yes     Systolic Blood Pressure: 120 mmHg     Is BP treated: No     HDL Cholesterol: 80.6 mg/dL     Total Cholesterol: 224 mg/dL  Low cholesterol diet and exercise.  Follow lipid panel.   Orders: -     Hepatic function panel; Future -     Basic metabolic panel with GFR; Future -     Lipid panel; Future -     CBC with Differential/Platelet; Future  Stress Assessment & Plan: Continues on citalopram . Overall handling things well. Follow.    Abnormal cervical Papanicolaou smear, unspecified abnormal pap finding Assessment & Plan: Saw gyn 04/07/23 - pap ASCUS/HPV negative. Colposcopy ok.  Repeat pap 03/03/24 - negative with negative HPV.    Other orders -     Citalopram  Hydrobromide; TAKE 1 TABLET(20 MG) BY MOUTH DAILY  Dispense: 90 tablet; Refill: 1 -     valACYclovir   HCl; TAKE 1 TABLET(500 MG) BY MOUTH DAILY  Dispense: 90 tablet; Refill: 1     Allena Hamilton, MD

## 2024-07-25 ENCOUNTER — Encounter: Payer: Self-pay | Admitting: Internal Medicine

## 2024-07-25 NOTE — Assessment & Plan Note (Signed)
Have discussed smoking cessation.

## 2024-07-25 NOTE — Assessment & Plan Note (Signed)
 Saw gyn 04/07/23 - pap ASCUS/HPV negative. Colposcopy ok.  Repeat pap 03/03/24 - negative with negative HPV.

## 2024-07-25 NOTE — Assessment & Plan Note (Signed)
 Continues on citalopram . Overall handling things well. Follow.

## 2024-09-02 ENCOUNTER — Other Ambulatory Visit: Payer: Self-pay | Admitting: Internal Medicine

## 2024-09-23 ENCOUNTER — Other Ambulatory Visit

## 2024-09-28 ENCOUNTER — Ambulatory Visit: Admitting: Internal Medicine

## 2024-09-28 ENCOUNTER — Encounter: Payer: Self-pay | Admitting: Internal Medicine

## 2024-09-28 VITALS — BP 126/70 | HR 75 | Temp 97.7°F | Ht 66.0 in | Wt 170.4 lb

## 2024-09-28 DIAGNOSIS — Z713 Dietary counseling and surveillance: Secondary | ICD-10-CM

## 2024-09-28 DIAGNOSIS — R87619 Unspecified abnormal cytological findings in specimens from cervix uteri: Secondary | ICD-10-CM

## 2024-09-28 DIAGNOSIS — E7800 Pure hypercholesterolemia, unspecified: Secondary | ICD-10-CM

## 2024-09-28 DIAGNOSIS — R634 Abnormal weight loss: Secondary | ICD-10-CM | POA: Diagnosis not present

## 2024-09-28 DIAGNOSIS — F439 Reaction to severe stress, unspecified: Secondary | ICD-10-CM | POA: Diagnosis not present

## 2024-09-28 DIAGNOSIS — Z860100 Personal history of colon polyps, unspecified: Secondary | ICD-10-CM

## 2024-09-28 MED ORDER — VALACYCLOVIR HCL 500 MG PO TABS: ORAL_TABLET | ORAL | 1 refills | Status: AC

## 2024-09-28 MED ORDER — CITALOPRAM HYDROBROMIDE 20 MG PO TABS: ORAL_TABLET | ORAL | 1 refills | Status: AC

## 2024-09-28 NOTE — Progress Notes (Signed)
 "  Subjective:    Patient ID: Maria Munoz, female    DOB: 1965/10/22, 59 y.o.   MRN: [id number]  Patient here for scheduled follow up appt.   HPI Here for a scheduled follow up - follow up regarding increased stress and hypercholesterolemia. Saw cardiology 12/06/22 - discussed calcium score, echo and zio patch. Elected to monitor. Saw gyn 04/07/23 - pap ASCUS/HPV negative. Colposcopy ok. Repeat pap 03/03/24 - ok. Continues on citalopram . Discussed wegovy - [facility] - tolerating. Taking a pre and pro biotic. Labs reviewed. A1c 5.6. total cholesterol 219 with triglyderides 86, HDL 82 and LDL 122. Hgb 14.7. electrolytes wnl. Discussed low carb diet and importance of eating an adequate amount of protein. Exercise. No chest pain or sob reported. No abdominal pain reported.    Past Medical History:  Diagnosis Date   Allergy    Arthritis    Herpes    H/O   History of abnormal Pap smear    Hypercholesterolemia    Past Surgical History:  Procedure Laterality Date   ANTERIOR CRUCIATE LIGAMENT REPAIR Left 2009   CERVICAL BIOPSY  W/ LOOP ELECTRODE EXCISION     COLONOSCOPY WITH PROPOFOL  N/A 10/18/2016   Procedure: COLONOSCOPY WITH PROPOFOL ;  Surgeon: Gladis RAYMOND Mariner, MD;  Location: [facility] ENDOSCOPY;  Service: Endoscopy;  Laterality: N/A;   COLONOSCOPY WITH PROPOFOL  N/A 05/31/2021   Procedure: COLONOSCOPY WITH PROPOFOL ;  Surgeon: Onita Elspeth Sharper, DO;  Location: [facility] ENDOSCOPY;  Service: Gastroenterology;  Laterality: N/A;   JOINT REPLACEMENT     RT Knee   NOVASURE ABLATION     TOTAL KNEE ARTHROPLASTY Right 05/13/2016   Procedure: RIGHT TOTAL KNEE ARTHROPLASTY;  Surgeon: Dempsey Moan, MD;  Location: [facility] ORS;  Service: Orthopedics;  Laterality: Right;   TYMPANOPLASTY     Family History  Problem Relation Age of Onset   Arthritis Mother    Hyperlipidemia Mother    Hypertension Mother    Cancer Father        prostate   Hyperlipidemia Father    Hypertension Father    Alcohol abuse Maternal  Grandfather    Breast cancer Neg Hx    Social History   Socioeconomic History   Marital status: Divorced    Spouse name: Not on file   Number of children: Not on file   Years of education: Not on file   Highest education level: Master's degree (e.g., MA, MS, MEng, MEd, MSW, MBA)  Occupational History   Not on file  Tobacco Use   Smoking status: Every Day    Current packs/day: 0.25    Average packs/day: 0.3 packs/day for 3.0 years (0.8 ttl pk-yrs)    Types: Cigarettes   Smokeless tobacco: Never   Tobacco comments:    2 cigarettes a day  Vaping Use   Vaping status: Never Used  Substance and Sexual Activity   Alcohol use: Yes    Alcohol/week: 0.0 standard drinks of alcohol    Comment: socially   Drug use: No   Sexual activity: Not on file  Other Topics Concern   Not on file  Social History Narrative   Not on file   Social Drivers of Health   Tobacco Use: High Risk (10/03/2024)   Patient History    Smoking Tobacco Use: Every Day    Smokeless Tobacco Use: Never    Passive Exposure: Not on file  Financial Resource Strain: Low Risk (07/19/2024)   Overall Financial Resource Strain (CARDIA)    Difficulty of Paying  Living Expenses: Not very hard  Food Insecurity: No Food Insecurity (07/19/2024)   Epic    Worried About Programme Researcher, Broadcasting/film/video in the Last Year: Never true    Ran Out of Food in the Last Year: Never true  Transportation Needs: No Transportation Needs (07/19/2024)   Epic    Lack of Transportation (Medical): No    Lack of Transportation (Non-Medical): No  Physical Activity: Insufficiently Active (07/19/2024)   Exercise Vital Sign    Days of Exercise per Week: 2 days    Minutes of Exercise per Session: 30 min  Stress: No Stress Concern Present (07/19/2024)   [employer] of Occupational Health - Occupational Stress Questionnaire    Feeling of Stress: Only a little  Social Connections: Moderately Isolated (07/19/2024)   Social Connection and Isolation Panel     Frequency of Communication with Friends and Family: More than three times a week    Frequency of Social Gatherings with Friends and Family: Three times a week    Attends Religious Services: 1 to 4 times per year    Active Member of Clubs or Organizations: No    Attends Banker Meetings: Not on file    Marital Status: Divorced  Depression (PHQ2-9): Low Risk (09/28/2024)   Depression (PHQ2-9)    PHQ-2 Score: 3  Alcohol Screen: Low Risk (07/19/2024)   Alcohol Screen    Last Alcohol Screening Score (AUDIT): 5  Housing: Low Risk (07/19/2024)   Epic    Unable to Pay for Housing in the Last Year: No    Number of Times Moved in the Last Year: 0    Homeless in the Last Year: No  Utilities: Not on file  Health Literacy: Not on file     Review of Systems  Constitutional:  Negative for appetite change and unexpected weight change.  HENT:  Negative for congestion and sinus pressure.   Respiratory:  Negative for cough, chest tightness and shortness of breath.   Cardiovascular:  Negative for chest pain, palpitations and leg swelling.  Gastrointestinal:  Negative for abdominal pain, diarrhea, nausea and vomiting.  Genitourinary:  Negative for difficulty urinating and dysuria.  Musculoskeletal:  Negative for joint swelling and myalgias.  Skin:  Negative for color change and rash.  Neurological:  Negative for dizziness and headaches.  Psychiatric/Behavioral:  Negative for agitation and dysphoric mood.        Objective:     BP 126/70   Pulse 75   Temp 97.7 F (36.5 C) (Oral)   Ht 5' 6 (1.676 m)   Wt 170 lb 6.4 oz (77.3 kg)   SpO2 96%   BMI 27.50 kg/m  Wt Readings from Last 3 Encounters:  09/28/24 170 lb 6.4 oz (77.3 kg)  07/20/24 176 lb (79.8 kg)  03/03/24 172 lb 6.4 oz (78.2 kg)    Physical Exam Vitals reviewed.  Constitutional:      General: She is not in acute distress.    Appearance: Normal appearance.  HENT:     Head: Normocephalic and atraumatic.      Right Ear: External ear normal.     Left Ear: External ear normal.     Mouth/Throat:     Pharynx: No oropharyngeal exudate or posterior oropharyngeal erythema.  Eyes:     General: No scleral icterus.       Right eye: No discharge.        Left eye: No discharge.     Conjunctiva/sclera: Conjunctivae normal.  Neck:  Thyroid : No thyromegaly.  Cardiovascular:     Rate and Rhythm: Normal rate and regular rhythm.  Pulmonary:     Effort: No respiratory distress.     Breath sounds: Normal breath sounds. No wheezing.  Abdominal:     General: Bowel sounds are normal.     Palpations: Abdomen is soft.     Tenderness: There is no abdominal tenderness.  Musculoskeletal:        General: No swelling or tenderness.     Cervical back: Neck supple. No tenderness.  Lymphadenopathy:     Cervical: No cervical adenopathy.  Skin:    Findings: No erythema or rash.  Neurological:     Mental Status: She is alert.  Psychiatric:        Mood and Affect: Mood normal.        Behavior: Behavior normal.         Outpatient Encounter Medications as of 09/28/2024  Medication Sig   citalopram  (CELEXA ) 20 MG tablet TAKE 1 TABLET(20 MG) BY MOUTH DAILY   Cyanocobalamin (VITAMIN B-12) 5000 MCG SUBL Place under the tongue daily at 6 (six) AM.   Misc Natural Products (OSTEO BI-FLEX ADV DOUBLE ST) TABS Take by mouth daily at 6 (six) AM.   valACYclovir  (VALTREX ) 500 MG tablet TAKE 1 TABLET(500 MG) BY MOUTH DAILY   [DISCONTINUED] citalopram  (CELEXA ) 20 MG tablet TAKE 1 TABLET(20 MG) BY MOUTH DAILY   [DISCONTINUED] valACYclovir  (VALTREX ) 500 MG tablet TAKE 1 TABLET(500 MG) BY MOUTH DAILY   No facility-administered encounter medications on file as of 09/28/2024.     Lab Results  Component Value Date   WBC 7.0 02/24/2024   HGB 14.0 02/24/2024   HCT 40.7 02/24/2024   PLT 235.0 02/24/2024   GLUCOSE 86 07/15/2024   CHOL 224 (H) 07/15/2024   TRIG 101.0 07/15/2024   HDL 80.60 07/15/2024   LDLDIRECT 104.1  07/23/2013   LDLCALC 123 (H) 07/15/2024   ALT 17 07/15/2024   AST 16 07/15/2024   NA 142 07/15/2024   K 4.0 07/15/2024   CL 103 07/15/2024   CREATININE 0.54 07/15/2024   BUN 20 07/15/2024   CO2 30 07/15/2024   TSH 1.72 02/24/2024   INR 0.84 05/03/2016    MM 3D SCREENING MAMMOGRAM BILATERAL BREAST Result Date: 04/15/2024 CLINICAL DATA:  Screening. EXAM: DIGITAL SCREENING BILATERAL MAMMOGRAM WITH TOMOSYNTHESIS AND CAD TECHNIQUE: Bilateral screening digital craniocaudal and mediolateral oblique mammograms were obtained. Bilateral screening digital breast tomosynthesis was performed. The images were evaluated with computer-aided detection. COMPARISON:  Previous exam(s). ACR Breast Density Category b: There are scattered areas of fibroglandular density. FINDINGS: There are no findings suspicious for malignancy. IMPRESSION: No mammographic evidence of malignancy. A result letter of this screening mammogram will be mailed directly to the patient. RECOMMENDATION: Screening mammogram in one year. (Code:[id number]-B-01Y) BI-RADS CATEGORY  1: Negative. Electronically Signed   By: Inocente Ast M.D.   On: 04/15/2024 08:31       Assessment & Plan:  Stress Assessment & Plan: Continues on citalopram . Appears to be doing well. Follow.    Hypercholesterolemia Assessment & Plan: The 10-year ASCVD risk score (Arnett DK, et al., 2019) is: 5.2%   Values used to calculate the score:     Age: 54 years     Clinically relevant sex: Female     Is Non-Hispanic African American: No     Diabetic: No     Tobacco smoker: Yes     Systolic Blood Pressure: 126 mmHg  Is BP treated: No     HDL Cholesterol: 80.6 mg/dL     Total Cholesterol: 224 mg/dL  Continue diet and exercise. Follow lipid panel.   Orders: -     CBC with Differential/Platelet; Future -     Lipid panel; Future -     Basic metabolic panel with GFR; Future -     Hepatic function panel; Future  History of colon polyps Assessment &  Plan: Colonoscopy 05/2021. Had recommended f/u in one year. Have previously discussed. See previous note.    Abnormal cervical Papanicolaou smear, unspecified abnormal pap finding Assessment & Plan: Saw gyn 04/07/23 - pap ASCUS/HPV negative. Colposcopy ok.  Repeat pap 03/03/24 - negative with negative HPV.    Weight loss  Weight loss counseling, encounter for Assessment & Plan: Being followed - [facility]. Discussed wegovy. Discussed importance of low carb diet. Discussed making sure eating an adequate amount of protein. Continue exercise. Follow. Outside labs reviewed. She will try to get a copy faxed to us .    Other orders -     Citalopram  Hydrobromide; TAKE 1 TABLET(20 MG) BY MOUTH DAILY  Dispense: 90 tablet; Refill: 1 -     valACYclovir  HCl; TAKE 1 TABLET(500 MG) BY MOUTH DAILY  Dispense: 90 tablet; Refill: 1     Allena Hamilton, MD "

## 2024-10-03 ENCOUNTER — Encounter: Payer: Self-pay | Admitting: Internal Medicine

## 2024-10-03 DIAGNOSIS — Z713 Dietary counseling and surveillance: Secondary | ICD-10-CM | POA: Insufficient documentation

## 2024-10-03 NOTE — Assessment & Plan Note (Signed)
 Saw gyn 04/07/23 - pap ASCUS/HPV negative. Colposcopy ok.  Repeat pap 03/03/24 - negative with negative HPV.

## 2024-10-03 NOTE — Assessment & Plan Note (Signed)
 Being followed - [facility]. Discussed wegovy. Discussed importance of low carb diet. Discussed making sure eating an adequate amount of protein. Continue exercise. Follow. Outside labs reviewed. She will try to get a copy faxed to us .

## 2024-10-03 NOTE — Assessment & Plan Note (Signed)
 Colonoscopy 05/2021. Had recommended f/u in one year. Have previously discussed. See previous note.

## 2024-10-03 NOTE — Assessment & Plan Note (Signed)
 Continues on citalopram . Appears to be doing well. Follow.
